# Patient Record
Sex: Female | Born: 1945
Health system: Southern US, Community
[De-identification: ages and names within clinical notes are randomized; demographics above are authoritative.]

## PROBLEM LIST (undated history)

## (undated) DIAGNOSIS — F0280 Dementia in other diseases classified elsewhere without behavioral disturbance: Secondary | ICD-10-CM

## (undated) DIAGNOSIS — R112 Nausea with vomiting, unspecified: Secondary | ICD-10-CM

## (undated) DIAGNOSIS — Z9889 Other specified postprocedural states: Secondary | ICD-10-CM

## (undated) DIAGNOSIS — E039 Hypothyroidism, unspecified: Secondary | ICD-10-CM

## (undated) DIAGNOSIS — M4800 Spinal stenosis, site unspecified: Secondary | ICD-10-CM

## (undated) DIAGNOSIS — M1990 Unspecified osteoarthritis, unspecified site: Secondary | ICD-10-CM

## (undated) DIAGNOSIS — F419 Anxiety disorder, unspecified: Secondary | ICD-10-CM

## (undated) DIAGNOSIS — E559 Vitamin D deficiency, unspecified: Secondary | ICD-10-CM

## (undated) DIAGNOSIS — E538 Deficiency of other specified B group vitamins: Secondary | ICD-10-CM

## (undated) DIAGNOSIS — M797 Fibromyalgia: Secondary | ICD-10-CM

## (undated) DIAGNOSIS — K5792 Diverticulitis of intestine, part unspecified, without perforation or abscess without bleeding: Secondary | ICD-10-CM

## (undated) DIAGNOSIS — K219 Gastro-esophageal reflux disease without esophagitis: Secondary | ICD-10-CM

## (undated) HISTORY — PX: APPENDECTOMY: SHX54

## (undated) HISTORY — PX: BREAST EXCISIONAL BIOPSY: SUR124

## (undated) HISTORY — PX: JOINT REPLACEMENT: SHX530

## (undated) HISTORY — PX: TRIGGER FINGER RELEASE: SHX641

## (undated) HISTORY — PX: OTHER SURGICAL HISTORY: SHX169

## (undated) HISTORY — PX: BACK SURGERY: SHX140

## (undated) HISTORY — PX: COLON SURGERY: SHX602

## (undated) HISTORY — DX: Deficiency of other specified B group vitamins: E53.8

## (undated) HISTORY — PX: BREAST SURGERY: SHX581

## (undated) HISTORY — PX: TUBAL LIGATION: SHX77

## (undated) HISTORY — PX: EYE SURGERY: SHX253

## (undated) HISTORY — PX: CHOLECYSTECTOMY: SHX55

## (undated) HISTORY — DX: Vitamin D deficiency, unspecified: E55.9

---

## 1999-12-13 ENCOUNTER — Encounter: Payer: Self-pay | Admitting: *Deleted

## 1999-12-13 ENCOUNTER — Encounter: Admission: RE | Admit: 1999-12-13 | Discharge: 1999-12-13 | Payer: Self-pay | Admitting: *Deleted

## 2000-01-30 ENCOUNTER — Other Ambulatory Visit: Admission: RE | Admit: 2000-01-30 | Discharge: 2000-01-30 | Payer: Self-pay | Admitting: Obstetrics and Gynecology

## 2000-02-27 ENCOUNTER — Other Ambulatory Visit: Admission: RE | Admit: 2000-02-27 | Discharge: 2000-02-27 | Payer: Self-pay | Admitting: Obstetrics and Gynecology

## 2000-02-27 ENCOUNTER — Encounter (INDEPENDENT_AMBULATORY_CARE_PROVIDER_SITE_OTHER): Payer: Self-pay | Admitting: Specialist

## 2000-07-27 ENCOUNTER — Encounter: Admission: RE | Admit: 2000-07-27 | Discharge: 2000-07-27 | Payer: Self-pay | Admitting: Gastroenterology

## 2000-07-27 ENCOUNTER — Encounter: Payer: Self-pay | Admitting: Gastroenterology

## 2000-12-13 ENCOUNTER — Encounter: Admission: RE | Admit: 2000-12-13 | Discharge: 2000-12-13 | Payer: Self-pay | Admitting: Obstetrics and Gynecology

## 2000-12-13 ENCOUNTER — Encounter: Payer: Self-pay | Admitting: Obstetrics and Gynecology

## 2000-12-13 ENCOUNTER — Other Ambulatory Visit: Admission: RE | Admit: 2000-12-13 | Discharge: 2000-12-13 | Payer: Self-pay | Admitting: Obstetrics and Gynecology

## 2001-11-20 ENCOUNTER — Encounter: Payer: Self-pay | Admitting: Orthopedic Surgery

## 2001-11-25 ENCOUNTER — Inpatient Hospital Stay (HOSPITAL_COMMUNITY): Admission: RE | Admit: 2001-11-25 | Discharge: 2001-11-28 | Payer: Self-pay | Admitting: Orthopedic Surgery

## 2002-04-22 ENCOUNTER — Encounter: Payer: Self-pay | Admitting: Obstetrics and Gynecology

## 2002-04-22 ENCOUNTER — Other Ambulatory Visit: Admission: RE | Admit: 2002-04-22 | Discharge: 2002-04-22 | Payer: Self-pay | Admitting: Obstetrics and Gynecology

## 2002-04-22 ENCOUNTER — Encounter: Admission: RE | Admit: 2002-04-22 | Discharge: 2002-04-22 | Payer: Self-pay | Admitting: Obstetrics and Gynecology

## 2002-12-29 ENCOUNTER — Encounter: Payer: Self-pay | Admitting: *Deleted

## 2002-12-29 ENCOUNTER — Emergency Department (HOSPITAL_COMMUNITY): Admission: EM | Admit: 2002-12-29 | Discharge: 2002-12-29 | Payer: Self-pay | Admitting: *Deleted

## 2003-05-07 ENCOUNTER — Encounter: Admission: RE | Admit: 2003-05-07 | Discharge: 2003-05-07 | Payer: Self-pay | Admitting: *Deleted

## 2003-05-07 ENCOUNTER — Other Ambulatory Visit: Admission: RE | Admit: 2003-05-07 | Discharge: 2003-05-07 | Payer: Self-pay | Admitting: Obstetrics and Gynecology

## 2004-05-20 ENCOUNTER — Ambulatory Visit (HOSPITAL_COMMUNITY): Admission: RE | Admit: 2004-05-20 | Discharge: 2004-05-20 | Payer: Self-pay | Admitting: Family Medicine

## 2005-05-02 ENCOUNTER — Other Ambulatory Visit: Admission: RE | Admit: 2005-05-02 | Discharge: 2005-05-02 | Payer: Self-pay | Admitting: *Deleted

## 2005-06-01 ENCOUNTER — Ambulatory Visit (HOSPITAL_COMMUNITY): Admission: RE | Admit: 2005-06-01 | Discharge: 2005-06-01 | Payer: Self-pay | Admitting: Family Medicine

## 2005-11-19 ENCOUNTER — Encounter: Admission: RE | Admit: 2005-11-19 | Discharge: 2005-11-19 | Payer: Self-pay | Admitting: Orthopedic Surgery

## 2005-12-08 ENCOUNTER — Emergency Department (HOSPITAL_COMMUNITY): Admission: EM | Admit: 2005-12-08 | Discharge: 2005-12-08 | Payer: Self-pay | Admitting: Emergency Medicine

## 2006-04-25 ENCOUNTER — Encounter: Admission: RE | Admit: 2006-04-25 | Discharge: 2006-06-06 | Payer: Self-pay | Admitting: *Deleted

## 2006-06-05 ENCOUNTER — Ambulatory Visit (HOSPITAL_COMMUNITY): Admission: RE | Admit: 2006-06-05 | Discharge: 2006-06-05 | Payer: Self-pay | Admitting: Family Medicine

## 2006-08-08 ENCOUNTER — Observation Stay (HOSPITAL_COMMUNITY): Admission: EM | Admit: 2006-08-08 | Discharge: 2006-08-09 | Payer: Self-pay | Admitting: Emergency Medicine

## 2006-08-22 ENCOUNTER — Encounter: Admission: RE | Admit: 2006-08-22 | Discharge: 2006-08-22 | Payer: Self-pay | Admitting: Gastroenterology

## 2006-10-09 ENCOUNTER — Other Ambulatory Visit: Admission: RE | Admit: 2006-10-09 | Discharge: 2006-10-09 | Payer: Self-pay | Admitting: *Deleted

## 2007-06-07 ENCOUNTER — Ambulatory Visit (HOSPITAL_COMMUNITY): Admission: RE | Admit: 2007-06-07 | Discharge: 2007-06-07 | Payer: Self-pay | Admitting: Family Medicine

## 2007-11-19 ENCOUNTER — Other Ambulatory Visit: Admission: RE | Admit: 2007-11-19 | Discharge: 2007-11-19 | Payer: Self-pay | Admitting: Family Medicine

## 2008-06-09 ENCOUNTER — Ambulatory Visit (HOSPITAL_COMMUNITY): Admission: RE | Admit: 2008-06-09 | Discharge: 2008-06-09 | Payer: Self-pay | Admitting: Family Medicine

## 2008-11-19 ENCOUNTER — Other Ambulatory Visit: Admission: RE | Admit: 2008-11-19 | Discharge: 2008-11-19 | Payer: Self-pay | Admitting: Family Medicine

## 2009-06-10 ENCOUNTER — Ambulatory Visit (HOSPITAL_COMMUNITY): Admission: RE | Admit: 2009-06-10 | Discharge: 2009-06-10 | Payer: Self-pay | Admitting: Family Medicine

## 2009-09-13 ENCOUNTER — Encounter: Admission: RE | Admit: 2009-09-13 | Discharge: 2009-09-13 | Payer: Self-pay | Admitting: Orthopedic Surgery

## 2010-06-13 ENCOUNTER — Ambulatory Visit (HOSPITAL_COMMUNITY)
Admission: RE | Admit: 2010-06-13 | Discharge: 2010-06-13 | Payer: Self-pay | Source: Home / Self Care | Attending: Family Medicine | Admitting: Family Medicine

## 2010-11-11 NOTE — H&P (Signed)
[place]. [facility]  Patient:    Jessica Whitney, Jessica C. Visit Number: [id number] MRN: [id number]          Service Type: SUR Location: 5000 5040 01 Attending Physician:  Carolan Shiver Ii Dictated by:   Jamelle Rushing, P.A. Admit Date:  11/25/2001 Discharge Date: 11/28/2001                           History and Physical  DATE OF BIRTH:  1945-08-15  CHIEF COMPLAINT:  Right knee pain.  HISTORY OF PRESENT ILLNESS:  The patient is a 65 year old white female with a history of knee pain since the early 1980s when she incurred a roller-skating accident injury to the knee.  She started having pain at that time.  She had two arthroscopic procedures which did give her some improvement, but she did incur a Staph infection at that time.  This was cleared up but the patient continued to have some good improvement until approximately 1993.  The patient fell once again, partially tearing her ACL in the right knee.  The patient did not require surgical intervention for this for repair and she continued having good results, up until the last few years, in which she started having return of some generalized constant pain with weightbearing activities.  The patient also had Synvisc injections, giving her good improvement for one year.  This last November, the pain significantly worsened with some increased stiffness in the knee.  The pain worsens with any type of weightbearing activity and during short periods of time thereafter with resting.  It is described as a deep throbbing, aching sensation.  It is located primarily in the posterior aspect of the knee but at times, it does present itself on the anterior aspect of the knee.  She does have discomfort radiating up into the distal femur. She does have night pain.  She currently does not have to use an assistive device for ambulation.  DRUG ALLERGIES:  SULFA.  CURRENT MEDICATIONS: 1. Femhrt 1/5 one tablet p.o.  q.d. 2. Protonix 40 mg p.o. q.d. 3. Paxil CR 25 mg p.o. q.d. 4. Tylenol Arthritis two to four tablets a day p.r.n.  MEDICAL HISTORY: 1. GERD, being managed by Dr. Katy Fitch. Buccini. 2. Fibromyalgia.   PAST SURGICAL PROCEDURES: 1. Left eye for crossed eye in 1951. 2. Tubal ligation in 1973. 3. Carpal tunnel in 1981. 4. Appendectomy in 1987. 5. Right knee arthroscopy in 1980 x2. 6. Colon resection in 1993. 7. Cholecystectomy in 1997.  The patient states she just has severe nausea with any type of anesthesia.  SOCIAL HISTORY:  The patient is a 65 year old healthy-appearing white female who denies any smoking or alcohol use.  She is currently married with two grown children and she lives in a [place].  She is currently employed as a Geologist, engineering.  PRIMARY CARE PHYSICIAN:  Family physician is [facility], ______, F.N.P., or Dr. Vanita Panda, [phone].  FAMILY MEDICAL HISTORY:  Both mother and father are alive and in good health at 73 years of age.  The patient has got one brother and two sisters in good health.  REVIEW OF SYSTEMS:  Review of systems is positive for glasses at all times. She does have problems with occasional flare-up of her reflux but this is generally well-controlled on Protonix.  The patient denies any other complications with any other sensory, respiratory, cardiac, GI, GU, hematologic, musculoskeletal,  neurologic or mental status issues.  PHYSICAL EXAMINATION:  VITALS:  Weight is 144.  Height is 5 feet 3 inches.  Pulse of 80 and regular, respirations 12, blood pressure is 124/68 and temperature is 99.0.  GENERAL:  This is a healthy-appearing, well-developed white female who ambulates with a minimal amount of limping or difficulty.  She does get on and off the exam table without any difficulty.  HEENT:  Head was normocephalic, atraumatic, nontender around maxillary or frontal sinuses.  Pupils equal, round and  reactive, accommodating to light. Extraocular movements are intact.  Sclerae are nonicteric.  Conjunctivae are pink and moist.  External ears were without deformities.  Canals patent.  TMs pearly gray and intact.  Gross hearing is intact.  Nasal septum was midline. Oral buccal mucosa was pink and moist without lesions.  Dentition was in good repair.  Uvula was midline.  The patient was able to swallow without any difficulties.  NECK:  Supple with no palpable lymphadenopathy.  Thyroid gland was nontender. The patient had good range of motion of her cervical spine with no tenderness along the percussion of the entire spinous process region.  CHEST:  Lung sounds were clear and equal bilaterally.  No wheezes, rales or rhonchi.  HEART:  Regular rate and rhythm.  S1 and S2 auscultated.  No murmurs, rubs, or gallops noted.  ABDOMEN:  Abdomen was round, soft and nontender.  She has no hepatosplenomegaly palpable.  She was nontender to deep palpation throughout. CVA was nontender to percussion and bowel sounds were normoactive throughout.  EXTREMITIES:  The patient had full range of motion of her shoulders, elbows and wrists without any difficulty.  Motor strength was 5/5.  Lower extremities:  Right and left hips had full extension, flexion up to 125 degrees with 20 degrees internal and external rotation without any mechanical symptoms bilaterally.  Right knee:  The patient was exquisitely tender along the medial and lateral and posterior aspects of the joint.  There was no sign of erythema or ecchymosis, no palpable effusion.  She had only slight crepitus under the patella with range of motion, which was about 3 degrees back to 100 degrees, with about 10 degrees valgus-varus laxity.  No anterior or posterior drawer.  The calf was nontender.  Left knee was without any erythema or ecchymosis and no effusion.  She was not specifically tender along the joint line.  She had full extension,  flexion back to 115 degrees, no valgus-varus laxity, no anterior or posterior drawer.  The calf was nontender.  Bilateral ankles were without any deformities.  She had good dorsiflexion and plantar flexion.  PERIPHERAL VASCULATURE:  Carotid pulses 2+, no bruits.  Radial pulses 2+. Femoral pulses 1+.  Dorsalis pedis and posterior tibial pulses were 1+ bilaterally with no edema or venous stasis changes noted.  NEUROLOGIC:  The patient was conscious, alert and appropriate and held an easy conversation with examiner.  Cranial nerves II-XII were grossly intact.  Deep tendon reflexes were brisk of the biceps, triceps, brachioradialis, the Achilles and the knees bilaterally.   IMPRESSION: 1. Severe osteoarthritis, right knee. 2. Gastroesophageal reflux disease. 3. Fibromyalgia. 4. History of Staphylococcus infection with arthroscopy of right knee.  PLAN:  The patient will be admitted to [facility] on November 25, 2001 under the care of Dr. Jonny Ruiz L. Rendall III.  The patient will undergo all routine labs and tests prior to this procedure.  We will also have the patient shower and have her knee  cleaned preoperatively with surgical prep in order to cut down the risks for a repeat Staph infection.  The patient will undergo a right total knee arthroplasty by Dr. Priscille Kluver. Dictated by:   Jamelle Rushing, P.A. Attending Physician:  Carolan Shiver Ii DD:  11/21/01 TD:  11/22/01 Job: [id number]

## 2010-11-11 NOTE — H&P (Signed)
NAMESHANEAL, BARASCH               ACCOUNT NO.:  [id number]   MEDICAL RECORD NO.:  [id number]          PATIENT TYPE:  INP   LOCATION:  2004                         FACILITY:  [facility]   PHYSICIAN:  Barnetta Chapel, MDDATE OF BIRTH:  [birth year]   DATE OF ADMISSION:  08/08/2006  DATE OF DISCHARGE:  08/09/2006                              HISTORY & PHYSICAL   PRIMARY CARE PHYSICIAN:  [employer] [facility] [employer].   PRESENTING COMPLAINT:  Chest pain x 4-5 days.   HISTORY OF PRESENT ILLNESS:  The patient is a 65 year old female with  past medical history significant for depression; fibromyalgia; and  gastric reflux.  The patient denied any history of hypertension,  diabetes mellitus, and the patient is a non nicotine abuser.  Patient  has a history of hypercholesterolemia.  The patient's maternal  grandparent,s family has a history of coronary artery disease.  The  patient presented with a 4- to 5-day history of chest pain.  The chest  pain is mainly at the lower sternal area.  The pain is said to be  intermittent, severe and would last for about one hour sometimes.  It is  not clear if this pain is radiating.  There is associated nausea, but no  shortness of breath or diaphoresis.   On presentation to the ER, the troponins so far (x two) have been  negative.  The D-dimer was minimally elevated, but the CT angiogram of  the chest was negative for PE.  The EKG does not reveal any ST segment  elevation or depression.  No headache, no neck pain, no cough, no fever  or chills, no vomiting or change in bowel habits, no weight loss and no  joint pains.   PAST MEDICAL HISTORY:  Past medical history is essentially as above.   MEDICATIONS:  Medications prior to admission include:  Wellbutrin 150 mg  p.o. once daily p.r.n.; paroxetine 13 mg p.o. once daily; Maxalt 10 mg  p.o. p.r.n.; Protonix 40 mg p.o. once daily.   SOCIAL HISTORY:  The patient is married with two children and two  grandchildren.  She denied any use of alcohol, illicit substances or  cigarettes.   FAMILY HISTORY:  Essentially as above.   REVIEW OF SYSTEMS:  Essentially as above.   PHYSICAL EXAMINATION:  VITAL SIGNS:  The temperature is 97.7, the blood  pressure is 102/54, the heart rate is 99, the respiratory rate is 18.  GENERAL APPEARANCE:  The patient is not in any pain at this moment.  HEENT:  No pallor, no jaundice.  External ocular muscles are intact.  NECK:  The neck is supple.  No raised JVD and no lymphadenopathy.  LUNGS:  Lungs are clear to auscultation.  CARDIOVASCULAR:  S1, S2, no heart murmur appreciated.  ABDOMEN:  The abdomen is soft and benign.  No epigastric tenderness.  No  organomegaly.  NEUROLOGIC:  Neurological exam is nonfocal.  EXTREMITIES:  No edema.   LABORATORY INVESTIGATIONS:  Labs done so far:  The sodium is 136, the  potassium is 4.3, the chloride is 107, the BUN is 12, with a  creatinine  of 1.  Blood sugar is 87.  White blood count is 6.7, hemoglobin of 13.4,  hematocrit of 39.2, MCV of 91.9, platelets of 409,000.  The troponin is  less than 0.05, and the CK-MB is less than 1.  The total protein is 6.7,  albumin is 3.7, AST 69, ALT is 18, with alkaline phosphatase of 71.  The  total bilirubin is 0.5, and the direct is less than 0.1.  Repeat  troponin is less than 0.05, and the repeat CK-MB is less than 1.   ASSESSMENT:  1. Chest pain.  2. Gastroesophageal reflux disease (GERD).  3. History of depression.  4. History of fibromyalgia.   PLAN:  Will admit the patient for observation.  Will get a cardiology  consultation as the patient will need a cardiac stress test in the  morning.  Meanwhile, will start the patient on subcutaneous Lovenox,  beta blocker, and ACE inhibitor.  We will also give the patient aspirin.  We will check fasting lipid profile; and we will get a chest x-ray.  Will check for fecal occult blood x three, as the patient's cause of  chest  pain may be of a GI origin.  Further management will depend on the  outcome of the investigations planned.      Barnetta Chapel, MD  Electronically Signed     SIO/MEDQ  D:  08/08/2006  T:  08/09/2006  Job:  [id number]

## 2010-11-11 NOTE — Op Note (Signed)
[place]. [facility]  Patient:    Fort, Jackelin C. Visit Number: [id number] MRN: [id number]          Service Type: SUR Location: 5000 5040 01 Attending Physician:  Carolan Shiver Ii Dictated by:   Carlisle Beers. Dorothyann Gibbs, M.D. Proc. Date: 11/25/01 Admit Date:  11/25/2001                             Operative Report  PREOPERATIVE DIAGNOSIS:  Osteoarthritis of right knee.  POSTOPERATIVE DIAGNOSIS:  Osteoarthritis of right knee.  OPERATION PERFORMED:  Cemented LCS total knee replacement, right.  SURGEON:  John L. Dorothyann Gibbs, M.D.  ASSISTANT: 1. Jamelle Rushing, P.A. 2. Scott, PAS.  ANESTHESIA:  General.  PATHOLOGY:  The patient has had an arthritic knee for many years.  Its symptoms of pain have been unremitting over the last year.  DESCRIPTION OF PROCEDURE:  Under general anesthesia, the right leg was prepared with DuraPrep and draped as a sterile field.  A sterile tourniquet was placed around the proximal thigh, leg was wrapped out with the Esmarch and a tourniquet was used at 350 mHg.  A midline incision was made in the skin and subcutaneous which was carried medial parapatellar deep.  The patella was everted, the femur was sized at a standard.  Proximal tibial resection is carried out.  The PCL was not preserved.  Using the first femoral guide, an intercondylar  drill hole was placed.  Using the second guide, the flexion gap was balanced at 12.5 by some release of the medial structures from the tibia by periosteal stripping and then the anterior and posterior flare of the distal femur were resected.  Using the intramedullary guide, a distal femoral cut was made, to balance the extension gap at 12.5 mm.  Recessing guide was then used.  Following this, lamina spreaders were inserted and remnants of the menisci and cruciates were removed.  Spurs off the back of the femoral condyles were resected with osteotome and mallet.  At this time the  proximal tibia was exposed with mchale, Hohmann and rake medially.  The tibia sized at 2.5, center peg hole was placed with a keel.  Trial reduction of the 2.5 tibia, 12.5 bearing and standard femur revealed excellent fit alignment and stability.  The patella was then osteotomized, three peg holes placed.  Trial patella was inserted.  Patellar tracking, range of motion and stability of the knee in flexion and extension was excellent.  Permanent components were obtained.  Bony surfaces were prepared with pulse irrigation, permanent components were then cemented into place.  Cement was hardened, tourniquet was let down at 46 minutes.  Excess cement was removed around the edges.  The tourniquet was let down and multiple small vessels were cauterized.  The wound was then closed in layers with #1 Tycron, 0 and 2-0 Vicryl and skin clips. Operative time approximately an hour and five minutes.  The patient tolerated the procedure well and returned to recovery in good condition. Dictated by:   Carlisle Beers. Dorothyann Gibbs, M.D. Attending Physician:  Carolan Shiver Ii DD:  11/25/01 TD:  11/25/01 Job: [id number]

## 2010-11-11 NOTE — Consult Note (Signed)
Jessica Whitney, Jessica Whitney               ACCOUNT NO.:  [id number]   MEDICAL RECORD NO.:  [id number]          PATIENT TYPE:  INP   LOCATION:  1829                         FACILITY:  [facility]   PHYSICIAN:  Lyn Records, M.D.   DATE OF BIRTH:  1945/07/08   DATE OF CONSULTATION:  08/09/2006  DATE OF DISCHARGE:                                 CONSULTATION   REASON FOR ADMISSION:  Chest discomfort.   CONCLUSIONS:  1. Prolonged substernal chest discomfort starting August 06, 2006.      Negative markers and normal EKGs, making ischemic pain unlikely.  2. History of gastroesophageal reflux.  3. History of migraines.  4. Fibromyalgia.  5. History of depression.   RECOMMENDATIONS:  1. Agree with rule-out as you have done.  2. Stress Cardiolite to rule out perfusion defects would verify the      presence of coronary artery disease.   COMMENTS:  The patient is 37 and has a history of reflux with chest  discomfort.  She saw Dr. Matthias Hughs on Monday.  Her therapy for reflux was  up-titrated.  She continued to have chest discomfort.  She called  [facility] yesterday and was instructed to come to  the emergency department.  She has been here since that time.  Her EKG  is normal.  Markers are negative. We have been consulted to rule out  coronary artery disease.   PAST MEDICAL HISTORY:  Her medical problems are as outlined above.   FAMILY HISTORY:  Negative for coronary atherosclerotic heart disease.  Her mother has a history of hypertension.   ALLERGIES:  SULFA.   MEDICATIONS:  Her typical medications are Wellbutrin, Maxalt, Protonix,  Paxil, Zegerid.  Since admission, metoprolol 12.5 mg per day and  lisinopril 20 mg per day have been added.  She is also now on an  aspirin.   HABITS:  She was a prior smoker.  Denies ethanol consumption.   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   PHYSICAL EXAMINATION:  GENERAL:  The patient  is lying quietly on the  gurney in no acute distress.  VITAL SIGNS:  Blood pressure 126/76.  Heart rate is 93.  Respirations  18.  HEENT:  Unremarkable.  CHEST:  Clear.  CARDIAC:  No gallop, no rub, no click, no murmur.  ABDOMEN:  Soft.  EXTREMITIES:  No edema.  Pulses are 2+ and symmetrical in the upper and  lower dentures.  NEUROLOGICAL:  Exam is normal.   LABORATORY AND ACCESSORY CLINICAL DATA:  Chest x-ray reveals no acute  disease.   CT reveals no pulmonary emboli.   Troponin I is 0.01.  CK-MB is normal.  D-dimer was 0.78, but chest CT is  negative for pulmonary emboli.  BUN and creatinine are normal at 12 and  1.0.  Hemoglobin is 13.4.   DISCUSSION:  Prolonged chest discomfort, probably noncardiac.  A  Cardiolite study will be done to exclude cardiac etiology.      Lyn Records, M.D.  Electronically Signed     HWS/MEDQ  D:  08/09/2006  T:  08/09/2006  Job:  [id number]   cc:   [facility]

## 2010-11-11 NOTE — Discharge Summary (Signed)
[place]. [facility]  Patient:    Jessica Whitney, Jessica C. Visit Number: [id number] MRN: [id number]          Service Type: SUR Location: 5000 5040 01 Attending Physician:  Carolan Shiver Ii Dictated by:   Jamelle Rushing, P.A. Admit Date:  11/25/2001 Discharge Date: 11/28/2001                             Discharge Summary  ADMISSION DIAGNOSES: 1. End-stage osteoarthritis, right knee. 2. Fibromyalgia. 3. Gastroesophageal reflux disease.  DISCHARGE DIAGNOSES: 1. Right total knee arthroplasty. 2. Postoperative blood loss anemia, asymptomatic. 3. Gastroesophageal reflux disease. 4. Fibromyalgia.  HISTORY OF PRESENT ILLNESS:  Patient is a 65 year old white female with history of right knee problems since early 71s.  Patient initially injured her knee while skating.  She had multiple arthroscopies for debridement of minuscule injuries.  She did have a resulting Staph infection after an arthroscopy which was improved after a course of antibiotic treatments. Approximately five years after that incident, patient had an injury while jumping tearing approximately 50% of her ACL in her right knee.  No treatment at that time was required.  Patient gradually improved.  Over the last several months since 04/2001, she has had significantly increase in pain in her knee with any type of activity.  The pain is located primarily in the posterior aspect of the knee.  It is described as an aching, throbbing sensation that does radiate up into the upper leg.  She does have popping, grinding and swelling and night pain in the knee.  She is not using an assistive device.  ALLERGIES:  Allergic to SULFA.  CURRENT MEDICATIONS: 1. Femhrt 1/5 one tablet p.o. q.d. 2. Protonix 40 mg p.o. q.d. 3. Paxil CR 25 mg p.o. q.d. 4. Tylenol arthritis p.r.n.  SURGICAL PROCEDURE:  On November 25, 2001, patient was taken to the operating room by Erasmo Leventhal, M.D. assisted by Jamelle Rushing,  P.A.C. and Dusty Scott, P.A.S. Under general anesthesia, the patient underwent a right total knee arthroplasty with a cemented LCS total knee replacement.  The patient tolerated the procedure well.  There were no complications. Patient had a postoperative femoral nerve block for pain management.  CONSULTS:  Routine physical therapy, occupational therapy and case management consults were requested.  HOSPITAL COURSE:  On November 25, 2001, patient was admitted to [facility] under the care of Dr. Erasmo Leventhal, M.D.  Patient was taken to the operating room where a right total knee arthroplasty was performed.  The patient tolerated the procedure well.  He was transferred to the recovery room and then to the orthopedic floor in good condition.  Patient was placed on Arixtra 2.5 mg subcutaneous a day for routine DVT prophylaxis.  Patient then incurred a total of three days postoperative course in which the patient did develop some asymptomatic postoperative postoperative anemia.  She also developed some calf discomfort in which a Doppler was performed and was negative for any DVT presence.  Otherwise the patient did very well with physical therapy.  She was able to get her knee up to 80 degrees in CPM.  Her pain was well controlled.  Her wound was benign for any signs of infection. On postoperative day #3, she felt like she was ready to be discharged to home and there were no reasons to keep her in the hospital.  She was, in fact, discharged home.  LABORATORIES:  CBC on November 27, 2001:  WBC 10.5, hemoglobin 9.5, hematocrit 28.0, platelets 269.  Routine chemistry on November 26, 2001:  Sodium 134, potassium 3.6, glucose 133, BUN 6, creatinine 0.8.  Routine urinalysis on admission was moderate for hemoglobin, few epithelial cells and many bacteria.  This was felt to be a contaminated specimen but she was also placed on routine postoperative antibiotics which would cover any UTI.  MEDICATIONS  ON DISCHARGE:  1. Colace 100 mg p.o. b.i.d.  2. Protonix 40 mg p.o. q.d.  3. Paxil 25 mg p.o. q.d.  4. Arixtra 2.5 mg subcutaneous q.d.  5. Femhrt 1/5 (home medication).  6. OxyContin CR 10 mg p.o. q.12 hours.  7. Percocet one to two tablets every 4-6 hours p.r.n. pain.  8. Tylenol 650 mg p.o. q.4 hours p.r.n.  9. Robaxin 500 mg p.o. q.4-6 hours p.r.n. spasms. 10. Laxative or enema of choice.  DISCHARGE INSTRUCTIONS: 1. Medications:    A. Patient can resume all home medications.    B. Percocet one to two tablets every 4-6 hours for breakthrough pain if       needed.    C. OxyContin CR 10 mg one tablet every 12 hours.    D. Arixtra 2.5 mg subcutaneous one injection a day for a total of seven       days. 2. Pain management:  Percocet and Oxycontin CR as noted above. 3. Activity:  As tolerated with use of a walker or crutches.  Patient may work    on full leg extension. 4. Diet:  No restrictions. 5. Wound care:  Patient to keep wound clean and dry.  She is to check the    wound daily for any signs of infection as discussed with the patient.  If    any problems, call Dr. Priscille Kluver. 6. Followup:  Patient is to have a follow-up appointment with Dr. Priscille Kluver in    one week from date of discharge.  DISCHARGE CONDITION:  Discharged to home.  Good. Dictated by:   Jamelle Rushing, P.A. Attending Physician:  Carolan Shiver Ii DD:  11/28/01 TD:  11/30/01 Job: [id number]

## 2011-05-12 ENCOUNTER — Other Ambulatory Visit (HOSPITAL_COMMUNITY): Payer: Self-pay | Admitting: Family Medicine

## 2011-05-12 DIAGNOSIS — Z1231 Encounter for screening mammogram for malignant neoplasm of breast: Secondary | ICD-10-CM

## 2011-06-15 ENCOUNTER — Ambulatory Visit (HOSPITAL_COMMUNITY): Payer: BC Managed Care – PPO

## 2011-07-11 ENCOUNTER — Ambulatory Visit (HOSPITAL_COMMUNITY): Payer: BC Managed Care – PPO

## 2011-08-07 ENCOUNTER — Ambulatory Visit (HOSPITAL_COMMUNITY)
Admission: RE | Admit: 2011-08-07 | Discharge: 2011-08-07 | Disposition: A | Discharge status: Home / Self Care | Payer: Medicare Other | Source: Ambulatory Visit | Attending: Family Medicine | Admitting: Family Medicine

## 2011-08-07 DIAGNOSIS — Z1231 Encounter for screening mammogram for malignant neoplasm of breast: Secondary | ICD-10-CM | POA: Insufficient documentation

## 2012-06-28 ENCOUNTER — Telehealth: Payer: Self-pay

## 2012-06-28 NOTE — Telephone Encounter (Signed)
Who did she see? I have called her, I do not have record of her/ I called and this message is in incorrect chart.

## 2012-06-28 NOTE — Telephone Encounter (Signed)
She needs appt. Left message for her to call back with her follow up plans.

## 2012-06-28 NOTE — Telephone Encounter (Signed)
Pt would like refill on her adderell please call [phone]

## 2012-08-12 ENCOUNTER — Other Ambulatory Visit (HOSPITAL_COMMUNITY): Payer: Self-pay | Admitting: Family Medicine

## 2012-08-12 DIAGNOSIS — Z1231 Encounter for screening mammogram for malignant neoplasm of breast: Secondary | ICD-10-CM

## 2012-08-22 ENCOUNTER — Ambulatory Visit (HOSPITAL_COMMUNITY)
Admission: RE | Admit: 2012-08-22 | Discharge: 2012-08-22 | Disposition: A | Discharge status: Home / Self Care | Payer: Medicare Other | Source: Ambulatory Visit | Attending: Family Medicine | Admitting: Family Medicine

## 2012-08-22 DIAGNOSIS — Z1231 Encounter for screening mammogram for malignant neoplasm of breast: Secondary | ICD-10-CM

## 2012-08-26 ENCOUNTER — Other Ambulatory Visit: Payer: Self-pay | Admitting: Orthopedic Surgery

## 2012-08-26 DIAGNOSIS — M25561 Pain in right knee: Secondary | ICD-10-CM

## 2012-08-28 ENCOUNTER — Ambulatory Visit
Admission: RE | Admit: 2012-08-28 | Discharge: 2012-08-28 | Disposition: A | Discharge status: Home / Self Care | Payer: Medicare Other | Source: Ambulatory Visit | Attending: Orthopedic Surgery | Admitting: Orthopedic Surgery

## 2012-08-28 DIAGNOSIS — M25461 Effusion, right knee: Secondary | ICD-10-CM

## 2012-08-28 DIAGNOSIS — M25561 Pain in right knee: Secondary | ICD-10-CM

## 2013-03-05 ENCOUNTER — Other Ambulatory Visit: Payer: Self-pay | Admitting: Neurosurgery

## 2013-03-05 DIAGNOSIS — M549 Dorsalgia, unspecified: Secondary | ICD-10-CM

## 2013-03-09 ENCOUNTER — Ambulatory Visit
Admission: RE | Admit: 2013-03-09 | Discharge: 2013-03-09 | Disposition: A | Discharge status: Home / Self Care | Payer: 59 | Source: Ambulatory Visit | Attending: Neurosurgery | Admitting: Neurosurgery

## 2013-03-09 DIAGNOSIS — M549 Dorsalgia, unspecified: Secondary | ICD-10-CM

## 2013-03-11 ENCOUNTER — Other Ambulatory Visit: Payer: Self-pay | Admitting: Neurosurgery

## 2013-03-25 ENCOUNTER — Encounter (HOSPITAL_COMMUNITY): Payer: Self-pay | Admitting: Pharmacy Technician

## 2013-03-26 ENCOUNTER — Encounter (HOSPITAL_COMMUNITY): Payer: Self-pay

## 2013-03-26 ENCOUNTER — Encounter (HOSPITAL_COMMUNITY)
Admission: RE | Admit: 2013-03-26 | Discharge: 2013-03-26 | Disposition: A | Discharge status: Home / Self Care | Payer: Medicare Other | Source: Ambulatory Visit | Attending: Neurosurgery | Admitting: Neurosurgery

## 2013-03-26 DIAGNOSIS — Z01812 Encounter for preprocedural laboratory examination: Secondary | ICD-10-CM | POA: Insufficient documentation

## 2013-03-26 DIAGNOSIS — Z01818 Encounter for other preprocedural examination: Secondary | ICD-10-CM | POA: Insufficient documentation

## 2013-03-26 HISTORY — DX: Fibromyalgia: M79.7

## 2013-03-26 HISTORY — DX: Gastro-esophageal reflux disease without esophagitis: K21.9

## 2013-03-26 HISTORY — DX: Anxiety disorder, unspecified: F41.9

## 2013-03-26 HISTORY — DX: Unspecified osteoarthritis, unspecified site: M19.90

## 2013-03-26 HISTORY — DX: Diverticulitis of intestine, part unspecified, without perforation or abscess without bleeding: K57.92

## 2013-03-26 HISTORY — DX: Other specified postprocedural states: Z98.890

## 2013-03-26 HISTORY — DX: Hypothyroidism, unspecified: E03.9

## 2013-03-26 HISTORY — DX: Other specified postprocedural states: R11.2

## 2013-03-26 LAB — BASIC METABOLIC PANEL
CO2: 27 mEq/L (ref 19–32)
Chloride: 103 mEq/L (ref 96–112)
Glucose, Bld: 90 mg/dL (ref 70–99)
Potassium: 4.2 mEq/L (ref 3.5–5.1)
Sodium: 140 mEq/L (ref 135–145)

## 2013-03-26 LAB — CBC
Hemoglobin: 13.3 g/dL (ref 12.0–15.0)
RBC: 4.23 MIL/uL (ref 3.87–5.11)
WBC: 6.3 10*3/uL (ref 4.0–10.5)

## 2013-03-26 LAB — SURGICAL PCR SCREEN
MRSA, PCR: NEGATIVE
Staphylococcus aureus: NEGATIVE

## 2013-03-26 NOTE — Progress Notes (Signed)
Primary physician - Dr. Vida Roller at [place] Does not have a cardiologist Ekg, stress - in epic from about 5 years ago.

## 2013-03-26 NOTE — Pre-Procedure Instructions (Signed)
Jessica Whitney  03/26/2013   Your procedure is scheduled on:  Thursday, October 9th  Report to Main Entrance "A" at 0530 AM.  Call this number if you have problems the morning of surgery: [phone]   Remember:   Do not eat food or drink liquids after midnight.   Take these medicines the morning of surgery with A SIP OF WATER: Celexa, Synthroid, Protonix, Zantac, Hydrocodone if needed   Do not wear jewelry, make-up or nail polish.  Do not wear lotions, powders, or perfumes. You may wear deodorant.  Do not shave 48 hours prior to surgery. Men may shave face and neck.  Do not bring valuables to the hospital.  [facility] is not responsible for any belongings or valuables.               Contacts, dentures or bridgework may not be worn into surgery.  Leave suitcase in the car. After surgery it may be brought to your room.  For patients admitted to the hospital, discharge time is determined by your   treatment team.               Patients discharged the day of surgery will not be allowed to drive home.    Special Instructions: Shower using CHG 2 nights before surgery and the night before surgery.  If you shower the day of surgery use CHG.  Use special wash - you have one bottle of CHG for all showers.  You should use approximately 1/3 of the bottle for each shower.   Please read over the following fact sheets that you were given: Pain Booklet, Coughing and Deep Breathing, MRSA Information and Surgical Site Infection Prevention

## 2013-04-02 MED ORDER — CEFAZOLIN SODIUM-DEXTROSE 2-3 GM-% IV SOLR
2.0000 g | INTRAVENOUS | Status: AC
  Administered 2013-04-03: 2 g via INTRAVENOUS
  Filled 2013-04-02: qty 50

## 2013-04-03 ENCOUNTER — Encounter (HOSPITAL_COMMUNITY): Payer: Self-pay | Admitting: Anesthesiology

## 2013-04-03 ENCOUNTER — Encounter (HOSPITAL_COMMUNITY): Payer: Medicare Other | Admitting: Anesthesiology

## 2013-04-03 ENCOUNTER — Inpatient Hospital Stay (HOSPITAL_COMMUNITY)
Admission: RE | Admit: 2013-04-03 | Discharge: 2013-04-04 | DRG: 520 | Disposition: A | Discharge status: Home / Self Care | Payer: Medicare Other | Source: Ambulatory Visit | Attending: Neurosurgery | Admitting: Neurosurgery

## 2013-04-03 ENCOUNTER — Ambulatory Visit (HOSPITAL_COMMUNITY): Payer: Medicare Other

## 2013-04-03 ENCOUNTER — Encounter (HOSPITAL_COMMUNITY)
Admission: RE | Disposition: A | Discharge status: Home / Self Care | Payer: Self-pay | Source: Ambulatory Visit | Attending: Neurosurgery

## 2013-04-03 ENCOUNTER — Ambulatory Visit (HOSPITAL_COMMUNITY): Payer: Medicare Other | Admitting: Anesthesiology

## 2013-04-03 DIAGNOSIS — Z9049 Acquired absence of other specified parts of digestive tract: Secondary | ICD-10-CM

## 2013-04-03 DIAGNOSIS — F411 Generalized anxiety disorder: Secondary | ICD-10-CM | POA: Diagnosis present

## 2013-04-03 DIAGNOSIS — Z79899 Other long term (current) drug therapy: Secondary | ICD-10-CM

## 2013-04-03 DIAGNOSIS — Z96659 Presence of unspecified artificial knee joint: Secondary | ICD-10-CM

## 2013-04-03 DIAGNOSIS — Z9089 Acquired absence of other organs: Secondary | ICD-10-CM

## 2013-04-03 DIAGNOSIS — M7130 Other bursal cyst, unspecified site: Secondary | ICD-10-CM | POA: Diagnosis present

## 2013-04-03 DIAGNOSIS — G8929 Other chronic pain: Secondary | ICD-10-CM | POA: Diagnosis present

## 2013-04-03 DIAGNOSIS — M48062 Spinal stenosis, lumbar region with neurogenic claudication: Secondary | ICD-10-CM

## 2013-04-03 DIAGNOSIS — E039 Hypothyroidism, unspecified: Secondary | ICD-10-CM | POA: Diagnosis present

## 2013-04-03 DIAGNOSIS — Z9851 Tubal ligation status: Secondary | ICD-10-CM

## 2013-04-03 DIAGNOSIS — M48061 Spinal stenosis, lumbar region without neurogenic claudication: Principal | ICD-10-CM | POA: Diagnosis present

## 2013-04-03 DIAGNOSIS — K219 Gastro-esophageal reflux disease without esophagitis: Secondary | ICD-10-CM | POA: Diagnosis present

## 2013-04-03 DIAGNOSIS — Z23 Encounter for immunization: Secondary | ICD-10-CM

## 2013-04-03 DIAGNOSIS — Z882 Allergy status to sulfonamides status: Secondary | ICD-10-CM

## 2013-04-03 DIAGNOSIS — Z87891 Personal history of nicotine dependence: Secondary | ICD-10-CM

## 2013-04-03 HISTORY — PX: LUMBAR LAMINECTOMY/DECOMPRESSION MICRODISCECTOMY: SHX5026

## 2013-04-03 SURGERY — LUMBAR LAMINECTOMY/DECOMPRESSION MICRODISCECTOMY 1 LEVEL
Anesthesia: General | Site: Back | Wound class: Clean

## 2013-04-03 MED ORDER — ONDANSETRON HCL 4 MG/2ML IJ SOLN: 4.0000 mg | INTRAMUSCULAR | Status: DC | PRN

## 2013-04-03 MED ORDER — SODIUM CHLORIDE 0.9 % IV SOLN: 250.0000 mL | INTRAVENOUS | Status: DC

## 2013-04-03 MED ORDER — CEFAZOLIN SODIUM-DEXTROSE 2-3 GM-% IV SOLR
2.0000 g | Freq: Three times a day (TID) | INTRAVENOUS | Status: AC
  Administered 2013-04-03 – 2013-04-04 (×3): 2 g via INTRAVENOUS
  Filled 2013-04-03 (×3): qty 50

## 2013-04-03 MED ORDER — BUPIVACAINE HCL (PF) 0.5 % IJ SOLN
INTRAMUSCULAR | Status: DC | PRN
  Administered 2013-04-03: 20 mL

## 2013-04-03 MED ORDER — PROMETHAZINE HCL 25 MG/ML IJ SOLN: 6.2500 mg | INTRAMUSCULAR | Status: DC | PRN

## 2013-04-03 MED ORDER — ACETAMINOPHEN 650 MG RE SUPP: 650.0000 mg | RECTAL | Status: DC | PRN

## 2013-04-03 MED ORDER — HYDROCODONE-ACETAMINOPHEN 5-325 MG PO TABS
ORAL_TABLET | ORAL | Status: AC
  Filled 2013-04-03: qty 2

## 2013-04-03 MED ORDER — PROPOFOL 10 MG/ML IV BOLUS
INTRAVENOUS | Status: DC | PRN
  Administered 2013-04-03: 120 mg via INTRAVENOUS

## 2013-04-03 MED ORDER — FENTANYL CITRATE 0.05 MG/ML IJ SOLN
INTRAMUSCULAR | Status: DC | PRN
  Administered 2013-04-03: 200 ug via INTRAVENOUS
  Administered 2013-04-03: 50 ug via INTRAVENOUS

## 2013-04-03 MED ORDER — DEXAMETHASONE SODIUM PHOSPHATE 4 MG/ML IJ SOLN
INTRAMUSCULAR | Status: DC | PRN
  Administered 2013-04-03: 8 mg via INTRAVENOUS

## 2013-04-03 MED ORDER — DEXAMETHASONE 4 MG PO TABS
4.0000 mg | ORAL_TABLET | Freq: Four times a day (QID) | ORAL | Status: AC
  Administered 2013-04-03 (×2): 4 mg via ORAL
  Filled 2013-04-03 (×2): qty 1

## 2013-04-03 MED ORDER — HEMOSTATIC AGENTS (NO CHARGE) OPTIME
TOPICAL | Status: DC | PRN
  Administered 2013-04-03: 1 via TOPICAL

## 2013-04-03 MED ORDER — SODIUM CHLORIDE 0.9 % IJ SOLN
3.0000 mL | Freq: Two times a day (BID) | INTRAMUSCULAR | Status: DC
  Administered 2013-04-03 – 2013-04-04 (×2): 3 mL via INTRAVENOUS

## 2013-04-03 MED ORDER — INFLUENZA VAC SPLIT QUAD 0.5 ML IM SUSP
0.5000 mL | INTRAMUSCULAR | Status: AC
  Administered 2013-04-04: 0.5 mL via INTRAMUSCULAR
  Filled 2013-04-03: qty 0.5

## 2013-04-03 MED ORDER — ACETAMINOPHEN 325 MG PO TABS: 650.0000 mg | ORAL_TABLET | ORAL | Status: DC | PRN

## 2013-04-03 MED ORDER — SODIUM CHLORIDE 0.9 % IR SOLN
Status: DC | PRN
  Administered 2013-04-03: 08:00:00

## 2013-04-03 MED ORDER — OXYCODONE HCL 5 MG/5ML PO SOLN: 5.0000 mg | Freq: Once | ORAL | Status: DC | PRN

## 2013-04-03 MED ORDER — THROMBIN 5000 UNITS EX SOLR
CUTANEOUS | Status: DC | PRN
  Administered 2013-04-03 (×2): 5000 [IU] via TOPICAL

## 2013-04-03 MED ORDER — HYDROMORPHONE HCL PF 1 MG/ML IJ SOLN
1.0000 mg | INTRAMUSCULAR | Status: DC | PRN
  Administered 2013-04-03: 1.5 mg via INTRAMUSCULAR
  Filled 2013-04-03: qty 2
  Filled 2013-04-03: qty 1

## 2013-04-03 MED ORDER — HYDROCODONE-ACETAMINOPHEN 5-325 MG PO TABS
1.0000 | ORAL_TABLET | ORAL | Status: DC | PRN
  Administered 2013-04-03 (×2): 2 via ORAL
  Filled 2013-04-03: qty 2

## 2013-04-03 MED ORDER — LEVOTHYROXINE SODIUM 75 MCG PO TABS
75.0000 ug | ORAL_TABLET | Freq: Every day | ORAL | Status: DC
  Filled 2013-04-03 (×2): qty 1

## 2013-04-03 MED ORDER — PANTOPRAZOLE SODIUM 40 MG IV SOLR
40.0000 mg | Freq: Every day | INTRAVENOUS | Status: DC
  Filled 2013-04-03: qty 40

## 2013-04-03 MED ORDER — ARTIFICIAL TEARS OP OINT
TOPICAL_OINTMENT | OPHTHALMIC | Status: DC | PRN
  Administered 2013-04-03: 1 via OPHTHALMIC

## 2013-04-03 MED ORDER — 0.9 % SODIUM CHLORIDE (POUR BTL) OPTIME
TOPICAL | Status: DC | PRN
  Administered 2013-04-03: 1000 mL

## 2013-04-03 MED ORDER — HYDROMORPHONE HCL PF 1 MG/ML IJ SOLN
0.2500 mg | INTRAMUSCULAR | Status: DC | PRN
  Administered 2013-04-03 (×2): 0.5 mg via INTRAVENOUS

## 2013-04-03 MED ORDER — GLYCOPYRROLATE 0.2 MG/ML IJ SOLN
INTRAMUSCULAR | Status: DC | PRN
  Administered 2013-04-03: 0.4 mg via INTRAVENOUS

## 2013-04-03 MED ORDER — LACTATED RINGERS IV SOLN
INTRAVENOUS | Status: DC | PRN
  Administered 2013-04-03 (×2): via INTRAVENOUS

## 2013-04-03 MED ORDER — DEXAMETHASONE SODIUM PHOSPHATE 4 MG/ML IJ SOLN: 4.0000 mg | Freq: Four times a day (QID) | INTRAMUSCULAR | Status: AC

## 2013-04-03 MED ORDER — PHENOL 1.4 % MT LIQD: 1.0000 | OROMUCOSAL | Status: DC | PRN

## 2013-04-03 MED ORDER — MIDAZOLAM HCL 5 MG/5ML IJ SOLN
INTRAMUSCULAR | Status: DC | PRN
  Administered 2013-04-03: 1 mg via INTRAVENOUS

## 2013-04-03 MED ORDER — CITALOPRAM HYDROBROMIDE 20 MG PO TABS
20.0000 mg | ORAL_TABLET | Freq: Every day | ORAL | Status: DC
  Filled 2013-04-03: qty 1

## 2013-04-03 MED ORDER — EPHEDRINE SULFATE 50 MG/ML IJ SOLN
INTRAMUSCULAR | Status: DC | PRN
  Administered 2013-04-03 (×3): 5 mg via INTRAVENOUS

## 2013-04-03 MED ORDER — HYDROMORPHONE HCL PF 1 MG/ML IJ SOLN
INTRAMUSCULAR | Status: AC
  Filled 2013-04-03: qty 1

## 2013-04-03 MED ORDER — SODIUM CHLORIDE 0.9 % IJ SOLN: 3.0000 mL | INTRAMUSCULAR | Status: DC | PRN

## 2013-04-03 MED ORDER — LIDOCAINE HCL (CARDIAC) 20 MG/ML IV SOLN
INTRAVENOUS | Status: DC | PRN
  Administered 2013-04-03: 40 mg via INTRAVENOUS

## 2013-04-03 MED ORDER — MENTHOL 3 MG MT LOZG: 1.0000 | LOZENGE | OROMUCOSAL | Status: DC | PRN

## 2013-04-03 MED ORDER — KETOROLAC TROMETHAMINE 30 MG/ML IJ SOLN
30.0000 mg | Freq: Four times a day (QID) | INTRAMUSCULAR | Status: DC
  Administered 2013-04-03 – 2013-04-04 (×4): 30 mg via INTRAVENOUS
  Filled 2013-04-03 (×8): qty 1

## 2013-04-03 MED ORDER — ROCURONIUM BROMIDE 100 MG/10ML IV SOLN
INTRAVENOUS | Status: DC | PRN
  Administered 2013-04-03: 50 mg via INTRAVENOUS

## 2013-04-03 MED ORDER — KCL IN DEXTROSE-NACL 20-5-0.45 MEQ/L-%-% IV SOLN
80.0000 mL/h | INTRAVENOUS | Status: DC
  Filled 2013-04-03 (×3): qty 1000

## 2013-04-03 MED ORDER — MIDAZOLAM HCL 2 MG/2ML IJ SOLN: 0.5000 mg | Freq: Once | INTRAMUSCULAR | Status: DC | PRN

## 2013-04-03 MED ORDER — CYCLOBENZAPRINE HCL 10 MG PO TABS: 10.0000 mg | ORAL_TABLET | Freq: Three times a day (TID) | ORAL | Status: DC | PRN

## 2013-04-03 MED ORDER — OXYCODONE HCL 5 MG PO TABS: 5.0000 mg | ORAL_TABLET | Freq: Once | ORAL | Status: DC | PRN

## 2013-04-03 MED ORDER — ONDANSETRON HCL 4 MG/2ML IJ SOLN
INTRAMUSCULAR | Status: DC | PRN
  Administered 2013-04-03 (×2): 4 mg via INTRAMUSCULAR

## 2013-04-03 MED ORDER — NEOSTIGMINE METHYLSULFATE 1 MG/ML IJ SOLN
INTRAMUSCULAR | Status: DC | PRN
  Administered 2013-04-03: 3 mg via INTRAVENOUS

## 2013-04-03 MED ORDER — PANTOPRAZOLE SODIUM 40 MG PO TBEC: 40.0000 mg | DELAYED_RELEASE_TABLET | Freq: Every day | ORAL | Status: DC

## 2013-04-03 MED ORDER — MEPERIDINE HCL 25 MG/ML IJ SOLN: 6.2500 mg | INTRAMUSCULAR | Status: DC | PRN

## 2013-04-03 SURGICAL SUPPLY — 54 items
ADH SKN CLS APL DERMABOND .7 (GAUZE/BANDAGES/DRESSINGS) ×1
ADH SKN CLS LQ APL DERMABOND (GAUZE/BANDAGES/DRESSINGS) ×1
APL SKNCLS STERI-STRIP NONHPOA (GAUZE/BANDAGES/DRESSINGS) ×1
BAG DECANTER FOR FLEXI CONT (MISCELLANEOUS) ×2 IMPLANT
BENZOIN TINCTURE PRP APPL 2/3 (GAUZE/BANDAGES/DRESSINGS) ×3 IMPLANT
BLADE SURG ROTATE 9660 (MISCELLANEOUS) ×2 IMPLANT
BNDG ADH 5X3 H2O RPLNT NS (GAUZE/BANDAGES/DRESSINGS) ×1
BNDG COHESIVE 3X5 WHT NS (GAUZE/BANDAGES/DRESSINGS) ×1 IMPLANT
BRUSH SCRUB EZ PLAIN DRY (MISCELLANEOUS) ×2 IMPLANT
BUR CUTTER 7.0 ROUND (BURR) ×2 IMPLANT
BUR MATCHSTICK NEURO 3.0 LAGG (BURR) ×2 IMPLANT
CANISTER SUCTION 2500CC (MISCELLANEOUS) ×2 IMPLANT
CLOTH BEACON ORANGE TIMEOUT ST (SAFETY) ×2 IMPLANT
CONT SPEC 4OZ CLIKSEAL STRL BL (MISCELLANEOUS) ×2 IMPLANT
DERMABOND ADHESIVE PROPEN (GAUZE/BANDAGES/DRESSINGS) ×1
DERMABOND ADVANCED (GAUZE/BANDAGES/DRESSINGS) ×1
DERMABOND ADVANCED .7 DNX12 (GAUZE/BANDAGES/DRESSINGS) ×1 IMPLANT
DERMABOND ADVANCED .7 DNX6 (GAUZE/BANDAGES/DRESSINGS) IMPLANT
DEVICE COFLEX STABLIZATION 10M (Neuro Prosthesis/Implant) ×1 IMPLANT
DRAPE LAPAROTOMY 100X72X124 (DRAPES) ×2 IMPLANT
DRAPE MICROSCOPE LEICA (MISCELLANEOUS) ×1 IMPLANT
DRAPE SURG 17X23 STRL (DRAPES) ×4 IMPLANT
DRESSING TELFA 8X3 (GAUZE/BANDAGES/DRESSINGS) ×2 IMPLANT
DURAPREP 26ML APPLICATOR (WOUND CARE) ×2 IMPLANT
ELECT REM PT RETURN 9FT ADLT (ELECTROSURGICAL) ×2
ELECTRODE REM PT RTRN 9FT ADLT (ELECTROSURGICAL) ×1 IMPLANT
EVACUATOR 1/8 PVC DRAIN (DRAIN) ×1 IMPLANT
GAUZE SPONGE 4X4 16PLY XRAY LF (GAUZE/BANDAGES/DRESSINGS) IMPLANT
GLOVE ECLIPSE 7.5 STRL STRAW (GLOVE) ×1 IMPLANT
GLOVE ECLIPSE 8.0 STRL XLNG CF (GLOVE) ×5 IMPLANT
GLOVE INDICATOR 8.0 STRL GRN (GLOVE) ×1 IMPLANT
GLOVE INDICATOR 8.5 STRL (GLOVE) ×1 IMPLANT
GOWN BRE IMP SLV AUR LG STRL (GOWN DISPOSABLE) IMPLANT
GOWN BRE IMP SLV AUR XL STRL (GOWN DISPOSABLE) ×3 IMPLANT
GOWN STRL REIN 2XL LVL4 (GOWN DISPOSABLE) ×1 IMPLANT
KIT BASIN OR (CUSTOM PROCEDURE TRAY) ×2 IMPLANT
KIT ROOM TURNOVER OR (KITS) ×2 IMPLANT
NDL SPNL 22GX3.5 QUINCKE BK (NEEDLE) ×2 IMPLANT
NEEDLE HYPO 22GX1.5 SAFETY (NEEDLE) ×2 IMPLANT
NEEDLE SPNL 22GX3.5 QUINCKE BK (NEEDLE) ×4 IMPLANT
NS IRRIG 1000ML POUR BTL (IV SOLUTION) ×2 IMPLANT
PACK LAMINECTOMY NEURO (CUSTOM PROCEDURE TRAY) ×2 IMPLANT
PAD ARMBOARD 7.5X6 YLW CONV (MISCELLANEOUS) ×6 IMPLANT
PATTIES SURGICAL .75X.75 (GAUZE/BANDAGES/DRESSINGS) ×2 IMPLANT
RUBBERBAND STERILE (MISCELLANEOUS) ×4 IMPLANT
SPONGE GAUZE 4X4 12PLY (GAUZE/BANDAGES/DRESSINGS) ×2 IMPLANT
SPONGE SURGIFOAM ABS GEL SZ50 (HEMOSTASIS) ×2 IMPLANT
STRIP CLOSURE SKIN 1/2X4 (GAUZE/BANDAGES/DRESSINGS) ×2 IMPLANT
SUT VIC AB 2-0 OS6 18 (SUTURE) ×6 IMPLANT
SUT VIC AB 3-0 CP2 18 (SUTURE) ×2 IMPLANT
SYR 20ML ECCENTRIC (SYRINGE) ×2 IMPLANT
TOWEL OR 17X24 6PK STRL BLUE (TOWEL DISPOSABLE) ×2 IMPLANT
TOWEL OR 17X26 10 PK STRL BLUE (TOWEL DISPOSABLE) ×2 IMPLANT
WATER STERILE IRR 1000ML POUR (IV SOLUTION) ×2 IMPLANT

## 2013-04-03 NOTE — Anesthesia Postprocedure Evaluation (Signed)
  Anesthesia Post-op Note  Patient: Jessica Whitney  Procedure(s) Performed: Procedure(s) with comments: Lumbar Four-Five Lumbar diskectomy with Coflex (N/A) - L4-5 Lumbar diskectomy with Coflex  Patient Location: PACU  Anesthesia Type:General  Level of Consciousness: awake, alert , oriented and patient cooperative  Airway and Oxygen Therapy: Patient Spontanous Breathing and Patient connected to nasal cannula oxygen  Post-op Pain: mild  Post-op Assessment: Post-op Vital signs reviewed, Patient's Cardiovascular Status Stable, Respiratory Function Stable, Patent Airway, No signs of Nausea or vomiting and Pain level controlled  Post-op Vital Signs: Reviewed and stable  Complications: No apparent anesthesia complications

## 2013-04-03 NOTE — Anesthesia Preprocedure Evaluation (Addendum)
Anesthesia Evaluation  Patient identified by MRN, date of birth, ID band Patient awake    Reviewed: Allergy & Precautions, H&P , NPO status , Patient's Chart, lab work & pertinent test results  History of Anesthesia Complications (+) PONV and history of anesthetic complications (PONV with 1 surgery)  Airway Mallampati: III TM Distance: >3 FB Neck ROM: Full    Dental  (+) Teeth Intact, Caps and Dental Advisory Given   Pulmonary former smoker,  breath sounds clear to auscultation  Pulmonary exam normal       Cardiovascular negative cardio ROS  Rhythm:Regular Rate:Normal     Neuro/Psych Anxiety Chronic back pain: narcotics daily    GI/Hepatic Neg liver ROS, GERD-  Medicated and Controlled,  Endo/Other  Hypothyroidism   Renal/GU negative Renal ROS     Musculoskeletal  (+) Arthritis -, Fibromyalgia -  Abdominal Normal abdominal exam  (+)   Peds  Hematology   Anesthesia Other Findings   Reproductive/Obstetrics                          Anesthesia Physical Anesthesia Plan  ASA: II  Anesthesia Plan: General   Post-op Pain Management:    Induction: Intravenous  Airway Management Planned: Oral ETT and Video Laryngoscope Planned  Additional Equipment:   Intra-op Plan:   Post-operative Plan: Extubation in OR  Informed Consent: I have reviewed the patients History and Physical, chart, labs and discussed the procedure including the risks, benefits and alternatives for the proposed anesthesia with the patient or authorized representative who has indicated his/her understanding and acceptance.   Dental advisory given  Plan Discussed with: CRNA, Anesthesiologist and Surgeon  Anesthesia Plan Comments: (Plan routine monitors, GETA with VideoGlide intubation )       Anesthesia Quick Evaluation

## 2013-04-03 NOTE — Transfer of Care (Signed)
Immediate Anesthesia Transfer of Care Note  Patient: Jessica Whitney  Procedure(s) Performed: Procedure(s) with comments: Lumbar Four-Five Lumbar diskectomy with Coflex (N/A) - L4-5 Lumbar diskectomy with Coflex  Patient Location: PACU  Anesthesia Type:General  Level of Consciousness: awake, alert  and oriented  Airway & Oxygen Therapy: Patient Spontanous Breathing and Patient connected to face mask oxygen  Post-op Assessment: Report given to PACU RN  Post vital signs: Reviewed and stable  Complications: No apparent anesthesia complications

## 2013-04-03 NOTE — H&P (Signed)
Jessica Whitney is an 67 y.o. female.   Chief Complaint: Back and right buttock and thigh pain HPI: The patient is a [age] female who was evaluated in the office for back and right hip pain a few months duration. She's had episodes in the past And injections a few years ago an MRI scan at that time. Which came to see me she had no new studies. She is her primary medical doctor gave her some injections without relief. She's been tried on Vicodin for pain was given a mildly Foley. After evaluation the office a new MRI scan of the lumbar spine was obtained. This showed a diffuse disc abnormality at L4-5 as well as marked collapse. After discussing the options it was elected to proceed with surgery with a bilateral decompression possible discectomy and placement of the Coflex. I've had a long discussion with her regarding the risks and benefits of surgical intervention. The risks discussed include but are not limited to bleeding infection weakness numbness paralysis spinal fluid leak trouble with instrumentation coma and death. We have discussed alternative methods of therapy offered risks and benefits of nonintervention. She's had the opportunity to ask numerous questions and appears to understand. With this information in hand she has requested that we proceed with surgery.  Past Medical History  Diagnosis Date  . PONV (postoperative nausea and vomiting)   . Hypothyroidism   . Fibromyalgia   . Anxiety   . GERD (gastroesophageal reflux disease)     bucchini  . Arthritis   . Diverticulitis     Past Surgical History  Procedure Laterality Date  . Colon surgery      foot of colon removed - diverticulitis  . Joint replacement Right     knee  . Knee arthroscopic Right   . Bone spur shoulder Bilateral   . Trigger finger release Right     x2  . Eye surgery Left     cross-eye fixed  . Appendectomy    . Cholecystectomy    . Tubal ligation    . Breast surgery Right     lumpectomy non cancerous     No family history on file. Social History:  reports that she has quit smoking. She does not have any smokeless tobacco history on file. She reports that she drinks alcohol. She reports that she does not use illicit drugs.  Allergies:  Allergies  Allergen Reactions  . Sulfa Antibiotics Other (See Comments)    Caused mouth sores    Medications Prior to Admission  Medication Sig Dispense Refill  . citalopram (CELEXA) 20 MG tablet Take 20 mg by mouth every morning.      Marland Kitchen HYDROcodone-acetaminophen (NORCO/VICODIN) 5-325 MG per tablet Take 1 tablet by mouth 2 (two) times daily as needed for pain.      Marland Kitchen levothyroxine (SYNTHROID, LEVOTHROID) 75 MCG tablet Take 75 mcg by mouth daily before breakfast.      . naproxen sodium (ANAPROX) 220 MG tablet Take 220 mg by mouth 2 (two) times daily with a meal.      . pantoprazole (PROTONIX) 40 MG tablet Take 40 mg by mouth every morning.      . Probiotic Product (ALIGN) 4 MG CAPS Take 4 mg by mouth every morning.      . calcium carbonate (TUMS EX) 750 MG chewable tablet Chew 2 tablets by mouth daily as needed (for indigestion).      . ranitidine (ZANTAC) 150 MG capsule Take 150 mg by mouth daily  as needed (for indigestion).        No results found for this or any previous visit (from the past 48 hour(s)). No results found.  Review of systems is positive for joint pain joint swelling of headaches. She denies any problems with chest pain asthma emphysema gastrointestinal or genitourinary problems but she has had a colectomy in the past  Blood pressure 126/68, pulse 65, temperature 97 F (36.1 C), temperature source Oral, resp. rate 16, SpO2 100.00%.  Patient is awake alert and oriented. She is no facial asymmetry. Her gait is mildly antalgic. Reflexes are decreased at the right ankle jerk her strength is 5 over 5 Assessment/Plan Impression is that of a diffuse disc abnormality with secondary stenosis and disc collapse at L4-5. The plan is for an  L4-5 decompression with placement of a Coflex device.  Reinaldo Meeker, MD 04/03/2013, 7:32 AM

## 2013-04-03 NOTE — Progress Notes (Signed)
UR COMPLETED  

## 2013-04-03 NOTE — Op Note (Signed)
Preop diagnosis: Spinal stenosis and synovial cyst L4-5 Postop diagnosis: Same Procedure: Bilateral L4-5 decompressive laminectomy with removal of synovial cyst Placement of Coflex device L4-5 Surgeon: Taleya Whitcher  After being placed the prone position the patient's back was prepped and draped in the usual sterile fashion. Localizing fluoroscopy was used prior to incision to identify the appropriate level. Midline incision was made above the spinous processes of L4 and L5. Using Bovie cutting current the incision was carried on the spinous processes. Subperiosteal dissection was then carried out bilaterally on the spinous processes lamina facet joint subcutaneous tract was placed for exposure. X-ray showed approach the appropriate level. Starting on the patient's left side laminotomy was performed removing the inferior one half of the L4 lamina the medial facet joint the superior one third of the L5 lamina. Residual bone and ligamentum flavum removed in a piecemeal fashion. Summary compression was then carried on the patient's right side. Interspinous space was identified and widened midline structures were removed to complete the bilateral laminectomy but the spinous processes were spared without difficulty. Synovial cyst was removed from the right side until the L5 nerve root was well visualized well decompressed. At this time inspection was carried out in all directions for any evidence of residual compression and none could be identified. Irrigation was carried out and any bleeding control proper coagulation Gelfoam. We then sized the interspinous complex device and found a 10 mm device was a good fit. The wings were splayed open the device was placed on an excellent position confirmed by fluoroscopy and the wings were Aleaya Latona on the spinous processes. AP lateral fossae looked excellent. Irrigation was carried out once more and any bleeding control proper coagulation Gelfoam. A drain was left and brought  through the area of the Coflex device is recommended. The was then closed in multiple layers of Vicryl on the muscle fascia subcutaneous and subcuticular tissues. Dermabond Steri-Strips were placed on the skin. Shortness was then applied the patient was extubated and taken to recovery room in stable condition.

## 2013-04-03 NOTE — Preoperative (Signed)
Beta Blockers   Reason not to administer Beta Blockers:Not Applicable 

## 2013-04-04 ENCOUNTER — Encounter (HOSPITAL_COMMUNITY): Payer: Self-pay | Admitting: Neurosurgery

## 2013-04-04 NOTE — Discharge Summary (Signed)
  Physician Discharge Summary  Patient ID: Jessica Whitney MRN: [id number] DOB/AGE: [birth year] 66 y.o.  Admit date: 04/03/2013 Discharge date: 04/04/2013  Admission Diagnoses:  Discharge Diagnoses:  Active Problems:   * No active hospital problems. *   Discharged Condition: good  Hospital Course: Surgery one day for spinal stenosis at L 45. Did very well with marked decrease in pain. Up ambulating next day. Wound fine. Home pod 1, specific instructions given.  Consults: None  Significant Diagnostic Studies: none  Treatments: L 45 decompression with coflex  Discharge Exam: Blood pressure 93/52, pulse 73, temperature 97.9 F (36.6 C), temperature source Oral, resp. rate 20, SpO2 95.00%. Incision/Wound:clean and dry; no new neuro issues  Disposition:      Medication List    ASK your doctor about these medications       ALIGN 4 MG Caps  Take 4 mg by mouth every morning.     calcium carbonate 750 MG chewable tablet  Commonly known as:  TUMS EX  Chew 2 tablets by mouth daily as needed (for indigestion).     citalopram 20 MG tablet  Commonly known as:  CELEXA  Take 20 mg by mouth every morning.     HYDROcodone-acetaminophen 5-325 MG per tablet  Commonly known as:  NORCO/VICODIN  Take 1 tablet by mouth 2 (two) times daily as needed for pain.     levothyroxine 75 MCG tablet  Commonly known as:  SYNTHROID, LEVOTHROID  Take 75 mcg by mouth daily before breakfast.     naproxen sodium 220 MG tablet  Commonly known as:  ANAPROX  Take 220 mg by mouth 2 (two) times daily with a meal.     pantoprazole 40 MG tablet  Commonly known as:  PROTONIX  Take 40 mg by mouth every morning.     ranitidine 150 MG capsule  Commonly known as:  ZANTAC  Take 150 mg by mouth daily as needed (for indigestion).         At home rest most of the time. Get up 9 or 10 times each day and take a 15 or 20 minute walk. No riding in the car and to your first postoperative appointment. If you  have neck surgery you may shower from the chest down starting on the third postoperative day. If you had back surgery he may start showering on the third postoperative day with saran wrap wrapped around your incisional area 3 times. After the shower remove the saran wrap. Take pain medicine as needed and other medications as instructed. Call my office for an appointment.  SignedReinaldo Meeker, MD 04/04/2013, 11:28 AM

## 2013-07-29 ENCOUNTER — Other Ambulatory Visit (HOSPITAL_COMMUNITY): Payer: Self-pay | Admitting: Family Medicine

## 2013-07-29 DIAGNOSIS — Z1231 Encounter for screening mammogram for malignant neoplasm of breast: Secondary | ICD-10-CM

## 2013-08-26 ENCOUNTER — Ambulatory Visit (HOSPITAL_COMMUNITY)
Admission: RE | Admit: 2013-08-26 | Discharge: 2013-08-26 | Disposition: A | Discharge status: Home / Self Care | Payer: Medicare Other | Source: Ambulatory Visit | Attending: Family Medicine | Admitting: Family Medicine

## 2013-08-26 DIAGNOSIS — Z1231 Encounter for screening mammogram for malignant neoplasm of breast: Secondary | ICD-10-CM

## 2014-01-20 ENCOUNTER — Other Ambulatory Visit: Payer: Self-pay | Admitting: Specialist

## 2014-01-20 DIAGNOSIS — M545 Low back pain, unspecified: Secondary | ICD-10-CM

## 2014-01-26 ENCOUNTER — Ambulatory Visit
Admission: RE | Admit: 2014-01-26 | Discharge: 2014-01-26 | Disposition: A | Discharge status: Home / Self Care | Payer: 59 | Source: Ambulatory Visit | Attending: Specialist | Admitting: Specialist

## 2014-01-26 DIAGNOSIS — M545 Low back pain, unspecified: Secondary | ICD-10-CM

## 2014-01-26 MED ORDER — GADOXETATE DISODIUM 0.25 MMOL/ML IV SOLN: 6.0000 mL | Freq: Once | INTRAVENOUS | Status: AC | PRN

## 2014-01-26 MED ORDER — GADOBENATE DIMEGLUMINE 529 MG/ML IV SOLN
13.0000 mL | Freq: Once | INTRAVENOUS | Status: AC | PRN
  Administered 2014-01-26: 13 mL via INTRAVENOUS

## 2014-09-14 ENCOUNTER — Encounter (HOSPITAL_BASED_OUTPATIENT_CLINIC_OR_DEPARTMENT_OTHER): Payer: Self-pay | Admitting: Emergency Medicine

## 2014-09-14 ENCOUNTER — Emergency Department (HOSPITAL_BASED_OUTPATIENT_CLINIC_OR_DEPARTMENT_OTHER): Payer: Medicare Other

## 2014-09-14 ENCOUNTER — Emergency Department (HOSPITAL_BASED_OUTPATIENT_CLINIC_OR_DEPARTMENT_OTHER)
Admission: EM | Admit: 2014-09-14 | Discharge: 2014-09-14 | Disposition: A | Discharge status: Home / Self Care | Payer: Medicare Other | Attending: Emergency Medicine | Admitting: Emergency Medicine

## 2014-09-14 DIAGNOSIS — S0990XA Unspecified injury of head, initial encounter: Secondary | ICD-10-CM | POA: Insufficient documentation

## 2014-09-14 DIAGNOSIS — Z87891 Personal history of nicotine dependence: Secondary | ICD-10-CM | POA: Diagnosis not present

## 2014-09-14 DIAGNOSIS — S79912A Unspecified injury of left hip, initial encounter: Secondary | ICD-10-CM | POA: Diagnosis not present

## 2014-09-14 DIAGNOSIS — S3992XA Unspecified injury of lower back, initial encounter: Secondary | ICD-10-CM | POA: Diagnosis not present

## 2014-09-14 DIAGNOSIS — M1990 Unspecified osteoarthritis, unspecified site: Secondary | ICD-10-CM | POA: Insufficient documentation

## 2014-09-14 DIAGNOSIS — S3991XA Unspecified injury of abdomen, initial encounter: Secondary | ICD-10-CM | POA: Insufficient documentation

## 2014-09-14 DIAGNOSIS — M797 Fibromyalgia: Secondary | ICD-10-CM | POA: Insufficient documentation

## 2014-09-14 DIAGNOSIS — M79602 Pain in left arm: Secondary | ICD-10-CM

## 2014-09-14 DIAGNOSIS — E039 Hypothyroidism, unspecified: Secondary | ICD-10-CM | POA: Diagnosis not present

## 2014-09-14 DIAGNOSIS — F419 Anxiety disorder, unspecified: Secondary | ICD-10-CM | POA: Insufficient documentation

## 2014-09-14 DIAGNOSIS — Y9389 Activity, other specified: Secondary | ICD-10-CM | POA: Insufficient documentation

## 2014-09-14 DIAGNOSIS — Z9049 Acquired absence of other specified parts of digestive tract: Secondary | ICD-10-CM | POA: Diagnosis not present

## 2014-09-14 DIAGNOSIS — K219 Gastro-esophageal reflux disease without esophagitis: Secondary | ICD-10-CM | POA: Diagnosis not present

## 2014-09-14 DIAGNOSIS — S4992XA Unspecified injury of left shoulder and upper arm, initial encounter: Secondary | ICD-10-CM | POA: Insufficient documentation

## 2014-09-14 DIAGNOSIS — Y92410 Unspecified street and highway as the place of occurrence of the external cause: Secondary | ICD-10-CM | POA: Diagnosis not present

## 2014-09-14 DIAGNOSIS — Z9851 Tubal ligation status: Secondary | ICD-10-CM | POA: Diagnosis not present

## 2014-09-14 DIAGNOSIS — Y998 Other external cause status: Secondary | ICD-10-CM | POA: Insufficient documentation

## 2014-09-14 DIAGNOSIS — Z79899 Other long term (current) drug therapy: Secondary | ICD-10-CM | POA: Diagnosis not present

## 2014-09-14 LAB — URINALYSIS, ROUTINE W REFLEX MICROSCOPIC
Bilirubin Urine: NEGATIVE
GLUCOSE, UA: NEGATIVE mg/dL
Ketones, ur: 15 mg/dL — AB
NITRITE: NEGATIVE
Protein, ur: NEGATIVE mg/dL
Specific Gravity, Urine: 1.018 (ref 1.005–1.030)
Urobilinogen, UA: 0.2 mg/dL (ref 0.0–1.0)
pH: 5.5 (ref 5.0–8.0)

## 2014-09-14 LAB — BASIC METABOLIC PANEL
Anion gap: 9 (ref 5–15)
BUN: 13 mg/dL (ref 6–23)
CALCIUM: 9.2 mg/dL (ref 8.4–10.5)
CO2: 27 mmol/L (ref 19–32)
Chloride: 103 mmol/L (ref 96–112)
Creatinine, Ser: 1.08 mg/dL (ref 0.50–1.10)
GFR, EST AFRICAN AMERICAN: 59 mL/min — AB (ref >90)
GFR, EST NON AFRICAN AMERICAN: 51 mL/min — AB (ref >90)
Glucose, Bld: 94 mg/dL (ref 70–99)
POTASSIUM: 3.6 mmol/L (ref 3.5–5.1)
Sodium: 139 mmol/L (ref 135–145)

## 2014-09-14 LAB — CBC WITH DIFFERENTIAL/PLATELET
Basophils Absolute: 0 10*3/uL (ref 0.0–0.1)
Basophils Relative: 0 % (ref 0–1)
Eosinophils Absolute: 0 10*3/uL (ref 0.0–0.7)
Eosinophils Relative: 0 % (ref 0–5)
HCT: 39.6 % (ref 36.0–46.0)
Hemoglobin: 13 g/dL (ref 12.0–15.0)
LYMPHS PCT: 28 % (ref 12–46)
Lymphs Abs: 2 10*3/uL (ref 0.7–4.0)
MCH: 30.5 pg (ref 26.0–34.0)
MCHC: 32.8 g/dL (ref 30.0–36.0)
MCV: 93 fL (ref 78.0–100.0)
MONO ABS: 0.8 10*3/uL (ref 0.1–1.0)
MONOS PCT: 12 % (ref 3–12)
NEUTROS PCT: 60 % (ref 43–77)
Neutro Abs: 4.2 10*3/uL (ref 1.7–7.7)
Platelets: 316 10*3/uL (ref 150–400)
RBC: 4.26 MIL/uL (ref 3.87–5.11)
RDW: 13.3 % (ref 11.5–15.5)
WBC: 7 10*3/uL (ref 4.0–10.5)

## 2014-09-14 LAB — URINE MICROSCOPIC-ADD ON

## 2014-09-14 MED ORDER — IOHEXOL 300 MG/ML  SOLN
100.0000 mL | Freq: Once | INTRAMUSCULAR | Status: AC | PRN
  Administered 2014-09-14: 100 mL via INTRAVENOUS
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
## 2014-09-14 NOTE — ED Notes (Signed)
MD at bedside. 

## 2014-09-14 NOTE — ED Notes (Signed)
Patient transported to CT 

## 2014-09-14 NOTE — Discharge Instructions (Signed)

## 2014-09-25 ENCOUNTER — Other Ambulatory Visit (HOSPITAL_COMMUNITY): Payer: Self-pay | Admitting: Family Medicine

## 2014-09-25 DIAGNOSIS — Z1231 Encounter for screening mammogram for malignant neoplasm of breast: Secondary | ICD-10-CM

## 2014-09-28 ENCOUNTER — Ambulatory Visit (HOSPITAL_COMMUNITY)
Admission: RE | Admit: 2014-09-28 | Discharge: 2014-09-28 | Disposition: A | Discharge status: Home / Self Care | Payer: Medicare Other | Source: Ambulatory Visit | Attending: Family Medicine | Admitting: Family Medicine

## 2014-09-28 DIAGNOSIS — Z1231 Encounter for screening mammogram for malignant neoplasm of breast: Secondary | ICD-10-CM | POA: Insufficient documentation

## 2014-12-21 ENCOUNTER — Other Ambulatory Visit: Payer: Self-pay

## 2015-04-09 ENCOUNTER — Emergency Department (HOSPITAL_COMMUNITY): Payer: Medicare Other

## 2015-04-09 ENCOUNTER — Ambulatory Visit (HOSPITAL_BASED_OUTPATIENT_CLINIC_OR_DEPARTMENT_OTHER)
Admission: RE | Admit: 2015-04-09 | Discharge: 2015-04-09 | Disposition: A | Discharge status: Home / Self Care | Payer: Medicare Other | Source: Ambulatory Visit | Attending: Emergency Medicine | Admitting: Emergency Medicine

## 2015-04-09 ENCOUNTER — Emergency Department (HOSPITAL_BASED_OUTPATIENT_CLINIC_OR_DEPARTMENT_OTHER)
Admission: EM | Admit: 2015-04-09 | Discharge: 2015-04-09 | Disposition: A | Discharge status: Home / Self Care | Payer: Medicare Other | Attending: Emergency Medicine | Admitting: Emergency Medicine

## 2015-04-09 ENCOUNTER — Encounter (HOSPITAL_BASED_OUTPATIENT_CLINIC_OR_DEPARTMENT_OTHER): Payer: Self-pay

## 2015-04-09 ENCOUNTER — Emergency Department (HOSPITAL_BASED_OUTPATIENT_CLINIC_OR_DEPARTMENT_OTHER): Payer: Medicare Other

## 2015-04-09 DIAGNOSIS — F419 Anxiety disorder, unspecified: Secondary | ICD-10-CM | POA: Diagnosis not present

## 2015-04-09 DIAGNOSIS — T07XXXA Unspecified multiple injuries, initial encounter: Secondary | ICD-10-CM

## 2015-04-09 DIAGNOSIS — S60811A Abrasion of right wrist, initial encounter: Secondary | ICD-10-CM | POA: Insufficient documentation

## 2015-04-09 DIAGNOSIS — Y9389 Activity, other specified: Secondary | ICD-10-CM | POA: Insufficient documentation

## 2015-04-09 DIAGNOSIS — Z23 Encounter for immunization: Secondary | ICD-10-CM | POA: Diagnosis not present

## 2015-04-09 DIAGNOSIS — Z79899 Other long term (current) drug therapy: Secondary | ICD-10-CM | POA: Diagnosis not present

## 2015-04-09 DIAGNOSIS — Y998 Other external cause status: Secondary | ICD-10-CM | POA: Diagnosis not present

## 2015-04-09 DIAGNOSIS — S6992XA Unspecified injury of left wrist, hand and finger(s), initial encounter: Secondary | ICD-10-CM | POA: Diagnosis present

## 2015-04-09 DIAGNOSIS — S60812A Abrasion of left wrist, initial encounter: Secondary | ICD-10-CM | POA: Insufficient documentation

## 2015-04-09 DIAGNOSIS — J3489 Other specified disorders of nose and nasal sinuses: Secondary | ICD-10-CM | POA: Insufficient documentation

## 2015-04-09 DIAGNOSIS — S0083XA Contusion of other part of head, initial encounter: Secondary | ICD-10-CM | POA: Diagnosis not present

## 2015-04-09 DIAGNOSIS — W010XXA Fall on same level from slipping, tripping and stumbling without subsequent striking against object, initial encounter: Secondary | ICD-10-CM | POA: Insufficient documentation

## 2015-04-09 DIAGNOSIS — E039 Hypothyroidism, unspecified: Secondary | ICD-10-CM | POA: Insufficient documentation

## 2015-04-09 DIAGNOSIS — Y9289 Other specified places as the place of occurrence of the external cause: Secondary | ICD-10-CM | POA: Insufficient documentation

## 2015-04-09 DIAGNOSIS — R0982 Postnasal drip: Secondary | ICD-10-CM | POA: Diagnosis not present

## 2015-04-09 DIAGNOSIS — J029 Acute pharyngitis, unspecified: Secondary | ICD-10-CM | POA: Insufficient documentation

## 2015-04-09 DIAGNOSIS — S199XXA Unspecified injury of neck, initial encounter: Secondary | ICD-10-CM | POA: Insufficient documentation

## 2015-04-09 DIAGNOSIS — Z87891 Personal history of nicotine dependence: Secondary | ICD-10-CM | POA: Diagnosis not present

## 2015-04-09 DIAGNOSIS — K219 Gastro-esophageal reflux disease without esophagitis: Secondary | ICD-10-CM | POA: Diagnosis not present

## 2015-04-09 DIAGNOSIS — M1990 Unspecified osteoarthritis, unspecified site: Secondary | ICD-10-CM | POA: Diagnosis not present

## 2015-04-09 DIAGNOSIS — S8002XA Contusion of left knee, initial encounter: Secondary | ICD-10-CM | POA: Diagnosis not present

## 2015-04-09 DIAGNOSIS — R0981 Nasal congestion: Secondary | ICD-10-CM | POA: Diagnosis not present

## 2015-04-09 DIAGNOSIS — S3992XA Unspecified injury of lower back, initial encounter: Secondary | ICD-10-CM | POA: Insufficient documentation

## 2015-04-09 DIAGNOSIS — R42 Dizziness and giddiness: Secondary | ICD-10-CM | POA: Diagnosis not present

## 2015-04-09 DIAGNOSIS — W19XXXA Unspecified fall, initial encounter: Secondary | ICD-10-CM

## 2015-04-09 DIAGNOSIS — S60222A Contusion of left hand, initial encounter: Secondary | ICD-10-CM | POA: Diagnosis not present

## 2015-04-09 HISTORY — DX: Spinal stenosis, site unspecified: M48.00

## 2015-04-09 MED ORDER — TETANUS-DIPHTH-ACELL PERTUSSIS 5-2.5-18.5 LF-MCG/0.5 IM SUSP
0.5000 mL | Freq: Once | INTRAMUSCULAR | Status: AC
Start: 2015-04-09 — End: 2015-04-09
  Administered 2015-04-09: 0.5 mL via INTRAMUSCULAR
  Filled 2015-04-09: qty 0.5

## 2015-04-09 NOTE — ED Provider Notes (Addendum)
CSN: [id number]     Arrival date & time 04/09/15  1537 History   First MD Initiated Contact with Patient 04/09/15 1542     Chief Complaint  Patient presents with  . Fall     (Consider location/radiation/quality/duration/timing/severity/associated sxs/prior Treatment) HPI Comments: Patient presents after a fall. She was walking [place] the court [place] and tripped on an elevated piece of sidewalk. She fell flat on her face. She has pain and swelling to the right side of her face. She also had her hands out he complains of pain primarily to her left hand as well as her left knee. She feels a little soreness in her knee. She has chronic pain in her low back which she says is unchanged from her baseline. She denies any numbness or weakness in her extremities. She denies any loss of consciousness, but does say that she feels dizzy. She denies any nausea or vomiting. She's not on anticoagulants.  Patient is a 69 y.o. female presenting with fall.  Fall Pertinent negatives include no chest pain, no abdominal pain, no headaches and no shortness of breath.    Past Medical History  Diagnosis Date  . PONV (postoperative nausea and vomiting)   . Hypothyroidism   . Fibromyalgia   . Anxiety   . GERD (gastroesophageal reflux disease)     bucchini  . Arthritis   . Diverticulitis   . Spinal stenosis    Past Surgical History  Procedure Laterality Date  . Colon surgery      foot of colon removed - diverticulitis  . Joint replacement Right     knee  . Knee arthroscopic Right   . Bone spur shoulder Bilateral   . Trigger finger release Right     x2  . Eye surgery Left     cross-eye fixed  . Appendectomy    . Cholecystectomy    . Tubal ligation    . Breast surgery Right     lumpectomy non cancerous  . Lumbar laminectomy/decompression microdiscectomy N/A 04/03/2013    Procedure: Lumbar Four-Five Lumbar diskectomy with Coflex;  Surgeon: Reinaldo Meeker, MD;  Location: [facility];  Service:  Neurosurgery;  Laterality: N/A;  L4-5 Lumbar diskectomy with Coflex  . Back surgery     No family history on file. Social History  Substance Use Topics  . Smoking status: Former Games developer  . Smokeless tobacco: None  . Alcohol Use: Yes     Comment: social   OB History    No data available     Review of Systems  Constitutional: Negative for fever, chills, diaphoresis and fatigue.  HENT: Positive for congestion, postnasal drip, rhinorrhea and sore throat. Negative for sneezing.   Eyes: Negative.   Respiratory: Negative for cough, chest tightness and shortness of breath.   Cardiovascular: Negative for chest pain and leg swelling.  Gastrointestinal: Negative for nausea, vomiting, abdominal pain, diarrhea and blood in stool.  Genitourinary: Negative for frequency, hematuria, flank pain and difficulty urinating.  Musculoskeletal: Positive for back pain (at baseline), arthralgias and neck pain.  Skin: Positive for wound. Negative for rash.  Neurological: Positive for dizziness. Negative for speech difficulty, weakness, numbness and headaches.      Allergies  Sulfa antibiotics  Home Medications   Prior to Admission medications   Medication Sig Start Date End Date Taking? Authorizing Provider  citalopram (CELEXA) 20 MG tablet Take 20 mg by mouth every morning.    Historical Provider, MD  HYDROcodone-acetaminophen (NORCO/VICODIN) 5-325 MG per tablet  Take 1 tablet by mouth 2 (two) times daily as needed for pain.    Historical Provider, MD  levothyroxine (SYNTHROID, LEVOTHROID) 75 MCG tablet Take 75 mcg by mouth daily before breakfast.    Historical Provider, MD  pantoprazole (PROTONIX) 40 MG tablet Take 40 mg by mouth every morning.    Historical Provider, MD   BP 100/62 mmHg  Pulse 94  Temp(Src) 97.8 F (36.6 C) (Oral)  Resp 18  Ht 5\' 3"  (1.6 m)  Wt 136 lb (61.689 kg)  BMI 24.10 kg/m2  SpO2 99% Physical Exam  Constitutional: She is oriented to person, place, and time. She  appears well-developed and well-nourished.  HENT:  Head: Normocephalic and atraumatic.  Mouth/Throat: Oropharynx is clear and moist.  No erythema or exudates  Eyes: Pupils are equal, round, and reactive to light.  Neck:  Mild tenderness to the mid cervical spine, no significant tenderness to the thoracic spine. She has some mild tenderness to the LS-spine but states this is chronic and unchanged from her baseline. No step-offs or deformities are noted.  Cardiovascular: Normal rate, regular rhythm and normal heart sounds.   Pulmonary/Chest: Effort normal and breath sounds normal. No respiratory distress. She has no wheezes. She has no rales. She exhibits no tenderness.  Abdominal: Soft. Bowel sounds are normal. There is no tenderness. There is no rebound and no guarding.  Musculoskeletal: Normal range of motion. She exhibits no edema.  Positive abrasions to the 4 surfaces of both wrists. There is ecchymosis swelling and tenderness over the fifth metacarpal of the left hand. There is no tenderness to the wrist bones. There is no pain to the elbow or shoulder. There is no bony tenderness on palpation of the right upper extremity. There is no pain on range of motion of the hips. There is tenderness and some mild swelling to the anterior aspect of the left knee with some underlying ecchymosis. There is no other pain on palpation or range of motion of the extremities.  Lymphadenopathy:    She has no cervical adenopathy.  Neurological: She is alert and oriented to person, place, and time.  Skin: Skin is warm and dry. No rash noted.  Psychiatric: She has a normal mood and affect.    ED Course  Procedures (including critical care time) Labs Review Labs Reviewed - No data to display  Imaging Review Ct Head Wo Contrast  04/09/2015  CLINICAL DATA:  Tripped on the sidewalk and fell landing on right side of face. Tenderness and pain along the right lateral side of the face. EXAM: CT HEAD WITHOUT  CONTRAST CT MAXILLOFACIAL WITHOUT CONTRAST CT CERVICAL SPINE WITHOUT CONTRAST TECHNIQUE: Multidetector CT imaging of the head, cervical spine, and maxillofacial structures were performed using the standard protocol without intravenous contrast. Multiplanar CT image reconstructions of the cervical spine and maxillofacial structures were also generated. COMPARISON:  None. FINDINGS: CT HEAD FINDINGS Mild atrophy and white matter changes are within normal limits for age. No acute infarct, hemorrhage, or mass lesion is present. Ventricles are of normal size. No significant extra-axial fluid collection is present. The paranasal sinuses are clear. There is some fluid in the inferior right mastoid air cells where chronic sclerotic changes are noted. The remaining right mastoid air cells and the right middle ear cavity are clear. CT MAXILLOFACIAL FINDINGS Soft tissue swelling is present over the right side of the face. There is no underlying fracture. The mandible is intact and located. Mild degenerative changes are present at the TMJ.  The paranasal sinuses are clear. There is some fluid and chronic sclerosis at the inferior aspect right mastoid. Facial bones are otherwise intact. CT CERVICAL SPINE FINDINGS Cervical spine is imaged from the skullbase through T3-4. Mild endplate degenerative changes and uncovertebral spurring is present at C5-6 and C6-7. No acute fracture or traumatic subluxation is present. There is some straightening of the normal cervical lordosis. The soft tissues the neck are unremarkable. Mild emphysematous changes are noted at the lung apices. IMPRESSION: 1. Normal CT appearance of the brain for age. 2. Minimal fluid in the inferior right mastoid air cells with chronic sclerotic changes. 3. Degenerative changes at the TMJ. 4. Degenerative changes in the cervical spine, most evident at C5-6 and C6-7. 5. No acute fracture. 6. Soft tissue swelling over the right side of the face. The mandible is intact  and located. Electronically Signed   By: Marin Robertshristopher  Mattern M.D.   On: 04/09/2015 16:40   Ct Cervical Spine Wo Contrast  04/09/2015  CLINICAL DATA:  Tripped on the sidewalk and fell landing on right side of face. Tenderness and pain along the right lateral side of the face. EXAM: CT HEAD WITHOUT CONTRAST CT MAXILLOFACIAL WITHOUT CONTRAST CT CERVICAL SPINE WITHOUT CONTRAST TECHNIQUE: Multidetector CT imaging of the head, cervical spine, and maxillofacial structures were performed using the standard protocol without intravenous contrast. Multiplanar CT image reconstructions of the cervical spine and maxillofacial structures were also generated. COMPARISON:  None. FINDINGS: CT HEAD FINDINGS Mild atrophy and white matter changes are within normal limits for age. No acute infarct, hemorrhage, or mass lesion is present. Ventricles are of normal size. No significant extra-axial fluid collection is present. The paranasal sinuses are clear. There is some fluid in the inferior right mastoid air cells where chronic sclerotic changes are noted. The remaining right mastoid air cells and the right middle ear cavity are clear. CT MAXILLOFACIAL FINDINGS Soft tissue swelling is present over the right side of the face. There is no underlying fracture. The mandible is intact and located. Mild degenerative changes are present at the TMJ. The paranasal sinuses are clear. There is some fluid and chronic sclerosis at the inferior aspect right mastoid. Facial bones are otherwise intact. CT CERVICAL SPINE FINDINGS Cervical spine is imaged from the skullbase through T3-4. Mild endplate degenerative changes and uncovertebral spurring is present at C5-6 and C6-7. No acute fracture or traumatic subluxation is present. There is some straightening of the normal cervical lordosis. The soft tissues the neck are unremarkable. Mild emphysematous changes are noted at the lung apices. IMPRESSION: 1. Normal CT appearance of the brain for age. 2.  Minimal fluid in the inferior right mastoid air cells with chronic sclerotic changes. 3. Degenerative changes at the TMJ. 4. Degenerative changes in the cervical spine, most evident at C5-6 and C6-7. 5. No acute fracture. 6. Soft tissue swelling over the right side of the face. The mandible is intact and located. Electronically Signed   By: Marin Robertshristopher  Mattern M.D.   On: 04/09/2015 16:40   Dg Hand Complete Left  04/09/2015  CLINICAL DATA:  Fifth metacarpal pain in the left hand after fall. EXAM: LEFT HAND - COMPLETE 3+ VIEW COMPARISON:  None. FINDINGS: No fracture, dislocation or suspicious focal osseous lesion. Diffuse osteopenia. Mild osteoarthritis in the interphalangeal joint left thumb and at the first carpometacarpal joint. No pathologic soft tissue calcifications. IMPRESSION: No fracture or dislocation in the left hand. Mild polyarticular osteoarthritis as described. Electronically Signed   By: Delbert PhenixJason A Poff  M.D.   On: 04/09/2015 16:55   Ct Maxillofacial Wo Cm  04/09/2015  CLINICAL DATA:  Tripped on the sidewalk and fell landing on right side of face. Tenderness and pain along the right lateral side of the face. EXAM: CT HEAD WITHOUT CONTRAST CT MAXILLOFACIAL WITHOUT CONTRAST CT CERVICAL SPINE WITHOUT CONTRAST TECHNIQUE: Multidetector CT imaging of the head, cervical spine, and maxillofacial structures were performed using the standard protocol without intravenous contrast. Multiplanar CT image reconstructions of the cervical spine and maxillofacial structures were also generated. COMPARISON:  None. FINDINGS: CT HEAD FINDINGS Mild atrophy and white matter changes are within normal limits for age. No acute infarct, hemorrhage, or mass lesion is present. Ventricles are of normal size. No significant extra-axial fluid collection is present. The paranasal sinuses are clear. There is some fluid in the inferior right mastoid air cells where chronic sclerotic changes are noted. The remaining right mastoid  air cells and the right middle ear cavity are clear. CT MAXILLOFACIAL FINDINGS Soft tissue swelling is present over the right side of the face. There is no underlying fracture. The mandible is intact and located. Mild degenerative changes are present at the TMJ. The paranasal sinuses are clear. There is some fluid and chronic sclerosis at the inferior aspect right mastoid. Facial bones are otherwise intact. CT CERVICAL SPINE FINDINGS Cervical spine is imaged from the skullbase through T3-4. Mild endplate degenerative changes and uncovertebral spurring is present at C5-6 and C6-7. No acute fracture or traumatic subluxation is present. There is some straightening of the normal cervical lordosis. The soft tissues the neck are unremarkable. Mild emphysematous changes are noted at the lung apices. IMPRESSION: 1. Normal CT appearance of the brain for age. 2. Minimal fluid in the inferior right mastoid air cells with chronic sclerotic changes. 3. Degenerative changes at the TMJ. 4. Degenerative changes in the cervical spine, most evident at C5-6 and C6-7. 5. No acute fracture. 6. Soft tissue swelling over the right side of the face. The mandible is intact and located. Electronically Signed   By: Marin Roberts M.D.   On: 04/09/2015 16:40   I have personally reviewed and evaluated these images and lab results as part of my medical decision-making.   EKG Interpretation None      MDM   Final diagnoses:  Hand contusion, left, initial encounter  Facial contusion, initial encounter  Abrasions of multiple sites    Patient presents after mechanical fall. There is no evident fractures. There is no intracranial hemorrhage. No evident spinal injuries. She has some small abrasions to her hands. These been bandaged. Her tetanus shot was updated. She initially had reports of dizziness but this seems to have resolved. She is ambulating without difficulty. A volar splint was placed to her left arm. She was advised  in ice and elevation. She states that she has hydrocodone to use at home for pain. She was advised to follow-up with her PCP if her symptoms are not improving.  Pt's x-ray of the left knee was cancelled as pt did not feel that she needed an x-ray.    Rolan Bucco, MD 04/09/15 1710  Rolan Bucco, MD 04/09/15 [phone]

## 2015-04-09 NOTE — Discharge Instructions (Signed)
Facial or Scalp Contusion A facial or scalp contusion is a deep bruise on the face or head. Injuries to the face and head generally cause a lot of swelling, especially around the eyes. Contusions are the result of an injury that caused bleeding under the skin. The contusion may turn blue, purple, or yellow. Minor injuries will give you a painless contusion, but more severe contusions may stay painful and swollen for a few weeks.  CAUSES  A facial or scalp contusion is caused by a blunt injury or trauma to the face or head area.  SIGNS AND SYMPTOMS   Swelling of the injured area.   Discoloration of the injured area.   Tenderness, soreness, or pain in the injured area.  DIAGNOSIS  The diagnosis can be made by taking a medical history and doing a physical exam. An X-ray exam, CT scan, or MRI may be needed to determine if there are any associated injuries, such as broken bones (fractures). TREATMENT  Often, the best treatment for a facial or scalp contusion is applying cold compresses to the injured area. Over-the-counter medicines may also be recommended for pain control.  HOME CARE INSTRUCTIONS   Only take over-the-counter or prescription medicines as directed by your health care provider.   Apply ice to the injured area.   Put ice in a plastic bag.   Place a towel between your skin and the bag.   Leave the ice on for 20 minutes, 2-3 times a day.  SEEK MEDICAL CARE IF:  You have bite problems.   You have pain with chewing.   You are concerned about facial defects. SEEK IMMEDIATE MEDICAL CARE IF:  You have severe pain or a headache that is not relieved by medicine.   You have unusual sleepiness, confusion, or personality changes.   You throw up (vomit).   You have a persistent nosebleed.   You have double vision or blurred vision.   You have fluid drainage from your nose or ear.   You have difficulty walking or using your arms or legs.  MAKE SURE YOU:    Understand these instructions.  Will watch your condition.  Will get help right away if you are not doing well or get worse.   This information is not intended to replace advice given to you by your health care provider. Make sure you discuss any questions you have with your health care provider.   Document Released: 07/20/2004 Document Revised: 07/03/2014 Document Reviewed: 01/23/2013 Elsevier Interactive Patient Education 2016 [employer]  Hand Contusion A hand contusion is a deep bruise on your hand area. Contusions are the result of an injury that caused bleeding under the skin. The contusion may turn blue, purple, or yellow. Minor injuries will give you a painless contusion, but more severe contusions may stay painful and swollen for a few weeks. CAUSES  A contusion is usually caused by a blow, trauma, or direct force to an area of the body. SYMPTOMS   Swelling and redness of the injured area.  Discoloration of the injured area.  Tenderness and soreness of the injured area.  Pain. DIAGNOSIS  The diagnosis can be made by taking a history and performing a physical exam. An X-ray, CT scan, or MRI may be needed to determine if there were any associated injuries, such as broken bones (fractures). TREATMENT  Often, the best treatment for a hand contusion is resting, elevating, icing, and applying cold compresses to the injured area. Over-the-counter medicines may also be  recommended for pain control. HOME CARE INSTRUCTIONS   Put ice on the injured area.  Put ice in a plastic bag.  Place a towel between your skin and the bag.  Leave the ice on for 15-20 minutes, 03-04 times a day.  Only take over-the-counter or prescription medicines as directed by your caregiver. Your caregiver may recommend avoiding anti-inflammatory medicines (aspirin, ibuprofen, and naproxen) for 48 hours because these medicines may increase bruising.  If told, use an elastic wrap as directed. This  can help reduce swelling. You may remove the wrap for sleeping, showering, and bathing. If your fingers become numb, cold, or blue, take the wrap off and reapply it more loosely.  Elevate your hand with pillows to reduce swelling.  Avoid overusing your hand if it is painful. SEEK IMMEDIATE MEDICAL CARE IF:   You have increased redness, swelling, or pain in your hand.  Your swelling or pain is not relieved with medicines.  You have loss of feeling in your hand or are unable to move your fingers.  Your hand turns cold or blue.  You have pain when you move your fingers.  Your hand becomes warm to the touch.  Your contusion does not improve in 2 days. MAKE SURE YOU:   Understand these instructions.  Will watch your condition.  Will get help right away if you are not doing well or get worse.   This information is not intended to replace advice given to you by your health care provider. Make sure you discuss any questions you have with your health care provider.   Document Released: 12/02/2001 Document Revised: 03/06/2012 Document Reviewed: 12/04/2011 Elsevier Interactive Patient Education [employer]

## 2015-04-09 NOTE — ED Notes (Signed)
Trip/fall on sidewalk today-pain to right side of face, left knee

## 2015-04-13 ENCOUNTER — Encounter (HOSPITAL_BASED_OUTPATIENT_CLINIC_OR_DEPARTMENT_OTHER): Payer: Self-pay | Admitting: Emergency Medicine

## 2015-04-13 ENCOUNTER — Emergency Department (HOSPITAL_BASED_OUTPATIENT_CLINIC_OR_DEPARTMENT_OTHER): Payer: Medicare Other

## 2015-04-13 ENCOUNTER — Emergency Department (HOSPITAL_BASED_OUTPATIENT_CLINIC_OR_DEPARTMENT_OTHER)
Admission: EM | Admit: 2015-04-13 | Discharge: 2015-04-13 | Disposition: A | Discharge status: Home / Self Care | Payer: Medicare Other | Attending: Emergency Medicine | Admitting: Emergency Medicine

## 2015-04-13 DIAGNOSIS — E039 Hypothyroidism, unspecified: Secondary | ICD-10-CM | POA: Diagnosis not present

## 2015-04-13 DIAGNOSIS — K219 Gastro-esophageal reflux disease without esophagitis: Secondary | ICD-10-CM | POA: Diagnosis not present

## 2015-04-13 DIAGNOSIS — Y9289 Other specified places as the place of occurrence of the external cause: Secondary | ICD-10-CM | POA: Insufficient documentation

## 2015-04-13 DIAGNOSIS — S6391XA Sprain of unspecified part of right wrist and hand, initial encounter: Secondary | ICD-10-CM | POA: Diagnosis not present

## 2015-04-13 DIAGNOSIS — F419 Anxiety disorder, unspecified: Secondary | ICD-10-CM | POA: Diagnosis not present

## 2015-04-13 DIAGNOSIS — Y998 Other external cause status: Secondary | ICD-10-CM | POA: Diagnosis not present

## 2015-04-13 DIAGNOSIS — Y9389 Activity, other specified: Secondary | ICD-10-CM | POA: Diagnosis not present

## 2015-04-13 DIAGNOSIS — S6991XA Unspecified injury of right wrist, hand and finger(s), initial encounter: Secondary | ICD-10-CM | POA: Diagnosis present

## 2015-04-13 DIAGNOSIS — Z87891 Personal history of nicotine dependence: Secondary | ICD-10-CM | POA: Insufficient documentation

## 2015-04-13 DIAGNOSIS — M1990 Unspecified osteoarthritis, unspecified site: Secondary | ICD-10-CM | POA: Insufficient documentation

## 2015-04-13 DIAGNOSIS — W1839XA Other fall on same level, initial encounter: Secondary | ICD-10-CM | POA: Diagnosis not present

## 2015-04-13 DIAGNOSIS — Z79899 Other long term (current) drug therapy: Secondary | ICD-10-CM | POA: Insufficient documentation

## 2015-04-13 NOTE — ED Provider Notes (Addendum)
CSN: [id number]     Arrival date & time 04/13/15  1303 History   First MD Initiated Contact with Patient 04/13/15 1316     Chief Complaint  Patient presents with  . Wrist Pain     (Consider location/radiation/quality/duration/timing/severity/associated sxs/prior Treatment) Patient is a 69 y.o. female presenting with hand injury. The history is provided by the patient.  Hand Injury Location:  Hand Time since incident:  4 days Injury: yes   Mechanism of injury: fall   Fall:    Fall occurred:  Walking and tripped   Impact surface:  Hard floor   Point of impact:  Outstretched arms and face Hand location:  R palm Pain details:    Quality:  Aching and shooting   Radiates to: right thumb.   Severity:  Moderate   Onset quality:  Gradual   Timing:  Constant   Progression:  Unchanged Chronicity:  New Handedness:  Right-handed Relieved by:  Rest Worsened by:  Movement Ineffective treatments:  None tried Associated symptoms: swelling   Associated symptoms: no decreased range of motion     Past Medical History  Diagnosis Date  . PONV (postoperative nausea and vomiting)   . Hypothyroidism   . Fibromyalgia   . Anxiety   . GERD (gastroesophageal reflux disease)     bucchini  . Arthritis   . Diverticulitis   . Spinal stenosis    Past Surgical History  Procedure Laterality Date  . Colon surgery      foot of colon removed - diverticulitis  . Joint replacement Right     knee  . Knee arthroscopic Right   . Bone spur shoulder Bilateral   . Trigger finger release Right     x2  . Eye surgery Left     cross-eye fixed  . Appendectomy    . Cholecystectomy    . Tubal ligation    . Breast surgery Right     lumpectomy non cancerous  . Lumbar laminectomy/decompression microdiscectomy N/A 04/03/2013    Procedure: Lumbar Four-Five Lumbar diskectomy with Coflex;  Surgeon: Reinaldo Meekerandy O Kritzer, MD;  Location: [facility];  Service: Neurosurgery;  Laterality: N/A;  L4-5 Lumbar diskectomy  with Coflex  . Back surgery     History reviewed. No pertinent family history. Social History  Substance Use Topics  . Smoking status: Former Games developermoker  . Smokeless tobacco: None  . Alcohol Use: Yes     Comment: social   OB History    No data available     Review of Systems  All other systems reviewed and are negative.     Allergies  Sulfa antibiotics  Home Medications   Prior to Admission medications   Medication Sig Start Date End Date Taking? Authorizing Provider  citalopram (CELEXA) 20 MG tablet Take 20 mg by mouth every morning.   Yes Historical Provider, MD  HYDROcodone-acetaminophen (NORCO/VICODIN) 5-325 MG per tablet Take 1 tablet by mouth 2 (two) times daily as needed for pain.   Yes Historical Provider, MD  levothyroxine (SYNTHROID, LEVOTHROID) 75 MCG tablet Take 75 mcg by mouth daily before breakfast.   Yes Historical Provider, MD  pantoprazole (PROTONIX) 40 MG tablet Take 40 mg by mouth every morning.   Yes Historical Provider, MD   There were no vitals taken for this visit. Physical Exam  Constitutional: She appears well-developed and well-nourished. No distress.  HENT:  Head: Normocephalic.    Eyes: EOM are normal. Pupils are equal, round, and reactive to light.  Cardiovascular:  Normal rate.   Pulmonary/Chest: Effort normal.  Musculoskeletal: She exhibits tenderness.       Hands: Contusion over the right thenar eminence with mild swelling. Pain with palpation of the right first MCP joint no right snuffbox or wrist tenderness. 2+ radial pulse  Nursing note and vitals reviewed.   ED Course  Procedures (including critical care time) Labs Review Labs Reviewed - No data to display  Imaging Review Dg Wrist Complete Right  04/13/2015  CLINICAL DATA:  Fall with wrist pain.  Initial encounter. EXAM: RIGHT WRIST - COMPLETE 3+ VIEW COMPARISON:  None. FINDINGS: No evidence of acute fracture or dislocation. First CMC osteoarthritis with narrowing and mild  spurring with subluxation. Osteopenia. IMPRESSION: No acute finding. Electronically Signed   By: Marnee Spring M.D.   On: 04/13/2015 13:53   I have personally reviewed and evaluated these images and lab results as part of my medical decision-making.   EKG Interpretation None      MDM   Final diagnoses:  Hand sprain, right, initial encounter    Patient with a mechanical fall on Friday initially seen in the emergency room and evaluated with imaging however since that time she had noticed worsening pain in her right thumb and wrist that was not initially x-rayed on her first visit. She came for further evaluation to ensure no injury. She has no tenderness and no significant wrist tenderness but moderate pain over the first MCP joint with ecchymosis and swelling.  Imaging pending  1:57 PM Imaging negative and patient placed in a thumb spica  Gwyneth Sprout, MD 04/13/15 1357  Gwyneth Sprout, MD 04/13/15 1358

## 2015-04-13 NOTE — Discharge Instructions (Signed)
Intermetacarpal Sprain °The intermetacarpal ligaments run between the knuckles at the base of the fingers. These ligaments are vulnerable to sprain and injury in which the ligament becomes overstretched or torn. Intermetacarpal sprains are classified into 3 categories. Grade 1 sprains cause pain, but the tendon is not lengthened. Grade 2 sprains include a lengthened ligament, due to the ligament being stretched or partially ruptured. With grade 2 sprains there is still function, although function may be decreased. Grade 3 sprains include a complete tear of the ligament, and the joint usually displays a loss of function.  °SYMPTOMS  °· Severe pain at the time of injury. °· Often, a feeling of popping or tearing inside the hand. °· Tenderness and inflammation at the knuckles. °· Bruising within a couple days of injury. °· Impaired ability to use the hand. °CAUSES  °This condition occurs when the intermetacarpal ligaments are subjected to a greater stress than they can handle. This causes the ligaments to become stretched or torn. °RISK INCREASES WITH: °· Previous hand injury. °· Fighting sports (boxing, wrestling, martial arts). °· Sports in which you could fall on an outstretched hand (soccer, basketball, volleyball). °· Other sports with repeated hand trauma (water polo, gymnastics). °· Poor hand strength and flexibility. °· Inadequate or poorly fitted protective equipment. °PREVENTION  °· Warm up and stretch properly before activity. °· Maintain appropriate conditioning: °¨ Hand flexibility. °¨ Muscle strength and endurance. °· Applying tape, protective strapping, or a brace may help prevent injury. °· Provide the hand with support during sports and practice activities for 6 to 12 months following injury. °PROGNOSIS  °With proper treatment, healing should occur without impairment. The length of healing varies from 2 to 12 weeks, depending on the severity of injury. °RELATED COMPLICATIONS  °· Longer healing time, if  activities are resumed too soon. °· Recurring symptoms or repeated injury, resulting in a chronic problem. °· Injury to other nearby structures (bone, cartilage, tendon). °· Arthritis of the knuckle (intermetacarpal) joint, with repeated sprains. °· Prolonged disability (sometimes). °· Hand and finger stiffness or weakness. °TREATMENT °Treatment first involves ice and medicine to reduce pain and inflammation. An elastic compression bandage may be worn to reduce discomfort and to protect the area. Depending on the severity of injury, you may be required to restrain the area with a cast, splint, or brace. After the ligament has been allowed to heal, strengthening and stretching exercises may be needed to regain strength and a full range of motion. Exercises may be completed at home or with a therapist. Surgery is rarely needed. °MEDICATION  °· If pain medicine is needed, nonsteroidal anti-inflammatory medicines (aspirin and ibuprofen), or other minor pain relievers (acetaminophen), are often advised. °· Do not take pain medicine for 7 days before surgery. °· Stronger pain relievers may be prescribed if your caregiver thinks they are needed. Use only as directed and only as much as you need. °HEAT AND COLD °· Cold treatment (icing) should be applied for 10 to 15 minutes every 2 to 3 hours for inflammation and pain, and immediately after activity that aggravates your symptoms. Use ice packs or an ice massage. °· Heat treatment may be used before performing stretching and strengthening activities prescribed by your caregiver, physical therapist, or athletic trainer. Use a heat pack or a warm water soak. °SEEK MEDICAL CARE IF:  °· Symptoms remain or get worse, despite treatment for longer than 2 to 4 weeks. °· You experience pain, numbness, discoloration, or coldness in the hand or fingers. °·   You develop blue, gray, or dark fingernails. °· Any of the following occur after surgery: increased pain, swelling, redness,  drainage of fluids, bleeding in the affected area, or signs of infection, including fever. °· New, unexplained symptoms develop. (Drugs used in treatment may produce side effects.) °  °This information is not intended to replace advice given to you by your health care provider. Make sure you discuss any questions you have with your health care provider. °  °Document Released: 06/12/2005 Document Revised: 07/03/2014 Document Reviewed: 11/30/2014 °Elsevier Interactive Patient Education ©2016 [employer] ° °

## 2015-04-13 NOTE — ED Notes (Addendum)
Pt was seen and evaluated 10/14 after fall, right facial bruising and left wrist was evaluated, returns today with increased right wrist pain and raised area of concern on medial right wrist

## 2015-09-20 ENCOUNTER — Other Ambulatory Visit: Payer: Self-pay

## 2015-09-20 DIAGNOSIS — Z1231 Encounter for screening mammogram for malignant neoplasm of breast: Secondary | ICD-10-CM

## 2015-09-21 ENCOUNTER — Other Ambulatory Visit: Payer: Self-pay | Admitting: Family Medicine

## 2015-09-21 DIAGNOSIS — Z0000 Encounter for general adult medical examination without abnormal findings: Secondary | ICD-10-CM

## 2015-09-22 ENCOUNTER — Other Ambulatory Visit: Payer: Self-pay | Admitting: Family Medicine

## 2015-09-22 DIAGNOSIS — E2839 Other primary ovarian failure: Secondary | ICD-10-CM

## 2015-10-05 ENCOUNTER — Ambulatory Visit: Payer: Medicare Other

## 2015-10-13 ENCOUNTER — Ambulatory Visit
Admission: RE | Admit: 2015-10-13 | Discharge: 2015-10-13 | Disposition: A | Discharge status: Home / Self Care | Payer: Medicare Other | Source: Ambulatory Visit

## 2015-10-13 ENCOUNTER — Ambulatory Visit
Admission: RE | Admit: 2015-10-13 | Discharge: 2015-10-13 | Disposition: A | Discharge status: Home / Self Care | Payer: Medicare Other | Source: Ambulatory Visit | Attending: Family Medicine | Admitting: Family Medicine

## 2015-10-13 DIAGNOSIS — Z1231 Encounter for screening mammogram for malignant neoplasm of breast: Secondary | ICD-10-CM

## 2015-10-13 DIAGNOSIS — E2839 Other primary ovarian failure: Secondary | ICD-10-CM

## 2015-10-20 ENCOUNTER — Other Ambulatory Visit: Payer: Self-pay | Admitting: Family Medicine

## 2015-10-20 DIAGNOSIS — R928 Other abnormal and inconclusive findings on diagnostic imaging of breast: Secondary | ICD-10-CM

## 2015-10-25 ENCOUNTER — Ambulatory Visit
Admission: RE | Admit: 2015-10-25 | Discharge: 2015-10-25 | Disposition: A | Discharge status: Home / Self Care | Payer: Medicare Other | Source: Ambulatory Visit | Attending: Family Medicine | Admitting: Family Medicine

## 2015-10-25 DIAGNOSIS — R928 Other abnormal and inconclusive findings on diagnostic imaging of breast: Secondary | ICD-10-CM

## 2015-12-14 ENCOUNTER — Other Ambulatory Visit (HOSPITAL_COMMUNITY): Payer: Self-pay | Admitting: Foot & Ankle Surgery

## 2015-12-14 DIAGNOSIS — R0989 Other specified symptoms and signs involving the circulatory and respiratory systems: Secondary | ICD-10-CM

## 2015-12-21 ENCOUNTER — Ambulatory Visit (HOSPITAL_COMMUNITY)
Admission: RE | Admit: 2015-12-21 | Discharge: 2015-12-21 | Disposition: A | Discharge status: Home / Self Care | Payer: Medicare Other | Source: Ambulatory Visit | Attending: Cardiology | Admitting: Cardiology

## 2015-12-21 DIAGNOSIS — R0989 Other specified symptoms and signs involving the circulatory and respiratory systems: Secondary | ICD-10-CM

## 2015-12-21 DIAGNOSIS — F419 Anxiety disorder, unspecified: Secondary | ICD-10-CM | POA: Diagnosis not present

## 2015-12-21 DIAGNOSIS — K219 Gastro-esophageal reflux disease without esophagitis: Secondary | ICD-10-CM | POA: Diagnosis not present

## 2015-12-21 DIAGNOSIS — I739 Peripheral vascular disease, unspecified: Secondary | ICD-10-CM | POA: Diagnosis not present

## 2016-09-06 ENCOUNTER — Telehealth: Payer: Self-pay | Admitting: *Deleted

## 2016-09-06 NOTE — Telephone Encounter (Signed)
NOTES SENT TO SCHEDULING.  °

## 2016-09-14 ENCOUNTER — Ambulatory Visit: Payer: Medicare Other | Admitting: Physician Assistant

## 2016-12-14 ENCOUNTER — Other Ambulatory Visit: Payer: Self-pay | Admitting: Family Medicine

## 2016-12-14 DIAGNOSIS — Z1231 Encounter for screening mammogram for malignant neoplasm of breast: Secondary | ICD-10-CM

## 2016-12-29 ENCOUNTER — Ambulatory Visit
Admission: RE | Admit: 2016-12-29 | Discharge: 2016-12-29 | Disposition: A | Discharge status: Home / Self Care | Payer: Medicare Other | Source: Ambulatory Visit | Attending: Family Medicine | Admitting: Family Medicine

## 2016-12-29 DIAGNOSIS — Z1231 Encounter for screening mammogram for malignant neoplasm of breast: Secondary | ICD-10-CM

## 2017-10-19 ENCOUNTER — Other Ambulatory Visit: Payer: Self-pay | Admitting: Family Medicine

## 2017-10-19 DIAGNOSIS — M810 Age-related osteoporosis without current pathological fracture: Secondary | ICD-10-CM

## 2017-11-27 ENCOUNTER — Inpatient Hospital Stay
Admission: RE | Admit: 2017-11-27 | Discharge: 2017-11-27 | Disposition: A | Discharge status: Home / Self Care | Payer: Medicare Other | Source: Ambulatory Visit | Attending: Family Medicine | Admitting: Family Medicine

## 2017-12-25 ENCOUNTER — Other Ambulatory Visit: Payer: Self-pay | Admitting: Family Medicine

## 2017-12-25 DIAGNOSIS — Z1231 Encounter for screening mammogram for malignant neoplasm of breast: Secondary | ICD-10-CM

## 2018-02-08 ENCOUNTER — Other Ambulatory Visit: Payer: Medicare Other

## 2018-02-08 ENCOUNTER — Ambulatory Visit: Payer: Medicare Other

## 2018-03-27 ENCOUNTER — Ambulatory Visit
Admission: RE | Admit: 2018-03-27 | Discharge: 2018-03-27 | Disposition: A | Discharge status: Home / Self Care | Payer: Medicare Other | Source: Ambulatory Visit | Attending: Family Medicine | Admitting: Family Medicine

## 2018-03-27 DIAGNOSIS — Z1231 Encounter for screening mammogram for malignant neoplasm of breast: Secondary | ICD-10-CM

## 2018-03-27 DIAGNOSIS — M810 Age-related osteoporosis without current pathological fracture: Secondary | ICD-10-CM

## 2018-09-04 DIAGNOSIS — E039 Hypothyroidism, unspecified: Secondary | ICD-10-CM | POA: Diagnosis not present

## 2018-09-04 DIAGNOSIS — E559 Vitamin D deficiency, unspecified: Secondary | ICD-10-CM | POA: Diagnosis not present

## 2018-09-04 DIAGNOSIS — E7800 Pure hypercholesterolemia, unspecified: Secondary | ICD-10-CM | POA: Diagnosis not present

## 2018-09-04 DIAGNOSIS — M810 Age-related osteoporosis without current pathological fracture: Secondary | ICD-10-CM | POA: Diagnosis not present

## 2018-09-04 DIAGNOSIS — F322 Major depressive disorder, single episode, severe without psychotic features: Secondary | ICD-10-CM | POA: Diagnosis not present

## 2018-09-16 DIAGNOSIS — M791 Myalgia, unspecified site: Secondary | ICD-10-CM | POA: Diagnosis not present

## 2018-09-16 DIAGNOSIS — M461 Sacroiliitis, not elsewhere classified: Secondary | ICD-10-CM | POA: Diagnosis not present

## 2018-09-16 DIAGNOSIS — M47816 Spondylosis without myelopathy or radiculopathy, lumbar region: Secondary | ICD-10-CM | POA: Diagnosis not present

## 2018-10-02 DIAGNOSIS — M47816 Spondylosis without myelopathy or radiculopathy, lumbar region: Secondary | ICD-10-CM | POA: Diagnosis not present

## 2018-12-04 DIAGNOSIS — M47816 Spondylosis without myelopathy or radiculopathy, lumbar region: Secondary | ICD-10-CM | POA: Diagnosis not present

## 2018-12-04 DIAGNOSIS — M461 Sacroiliitis, not elsewhere classified: Secondary | ICD-10-CM | POA: Diagnosis not present

## 2018-12-11 DIAGNOSIS — M461 Sacroiliitis, not elsewhere classified: Secondary | ICD-10-CM | POA: Diagnosis not present

## 2019-04-09 ENCOUNTER — Other Ambulatory Visit: Payer: Self-pay | Admitting: Family Medicine

## 2019-04-09 DIAGNOSIS — Z1231 Encounter for screening mammogram for malignant neoplasm of breast: Secondary | ICD-10-CM

## 2019-04-10 DIAGNOSIS — M810 Age-related osteoporosis without current pathological fracture: Secondary | ICD-10-CM | POA: Diagnosis not present

## 2019-04-10 DIAGNOSIS — F322 Major depressive disorder, single episode, severe without psychotic features: Secondary | ICD-10-CM | POA: Diagnosis not present

## 2019-04-10 DIAGNOSIS — E039 Hypothyroidism, unspecified: Secondary | ICD-10-CM | POA: Diagnosis not present

## 2019-04-10 DIAGNOSIS — Z23 Encounter for immunization: Secondary | ICD-10-CM | POA: Diagnosis not present

## 2019-04-10 DIAGNOSIS — Z0000 Encounter for general adult medical examination without abnormal findings: Secondary | ICD-10-CM | POA: Diagnosis not present

## 2019-04-10 DIAGNOSIS — R413 Other amnesia: Secondary | ICD-10-CM | POA: Diagnosis not present

## 2019-04-10 DIAGNOSIS — E559 Vitamin D deficiency, unspecified: Secondary | ICD-10-CM | POA: Diagnosis not present

## 2019-04-10 DIAGNOSIS — E7800 Pure hypercholesterolemia, unspecified: Secondary | ICD-10-CM | POA: Diagnosis not present

## 2019-04-14 ENCOUNTER — Ambulatory Visit: Payer: Medicare Other

## 2019-04-17 DIAGNOSIS — E538 Deficiency of other specified B group vitamins: Secondary | ICD-10-CM | POA: Diagnosis not present

## 2019-04-21 ENCOUNTER — Ambulatory Visit (INDEPENDENT_AMBULATORY_CARE_PROVIDER_SITE_OTHER): Payer: Medicare HMO | Admitting: Neurology

## 2019-04-21 ENCOUNTER — Other Ambulatory Visit: Payer: Self-pay

## 2019-04-21 ENCOUNTER — Encounter: Payer: Self-pay | Admitting: Neurology

## 2019-04-21 VITALS — BP 117/76 | HR 86 | Temp 96.9°F | Ht 63.0 in | Wt 135.0 lb

## 2019-04-21 DIAGNOSIS — R413 Other amnesia: Secondary | ICD-10-CM

## 2019-04-21 DIAGNOSIS — E079 Disorder of thyroid, unspecified: Secondary | ICD-10-CM | POA: Diagnosis not present

## 2019-04-21 DIAGNOSIS — E538 Deficiency of other specified B group vitamins: Secondary | ICD-10-CM

## 2019-04-21 NOTE — Progress Notes (Signed)
Subjective:    Patient ID: Jessica Whitney is a 73 y.o. female.  HPI     Star Age, MD, PhD [facility] [address] [place], [place] [address]   Dear Loma Sousa, I saw your patient, Jessica Whitney, plan you can request to my neurologic clinic today for initial consultation of her memory loss.  The patient is accompanied by her husband, today.  As you know, Jessica Whitney is a 73 year old right-handed woman with an underlying spinal stenosis, hypothyroidism, reflux disease, diverticulitis, history of arthritis, anxiety, allergic rhinitis, depression, history of fibromyalgia, osteoporosis and osteopenia, status post lumbar spine surgery, history of epidural steroid injections, history of sigmoid colectomy for complicated diverticulitis, vitamin B12 and vitamin D deficiencies in the past, who reports forgetfulness.  Her husband feels that she has had issues with forgetfulness and repeating herself for the past year or 2.  I reviewed your office note from 04/10/2019.  She had blood work in your office including CMP, TSH.  Her TSH was high at 20.35 at the time.  Her BUN was 15, creatinine 1.15.  B12 was low at 147 at the time.  She was advised to start oral vitamin B12 supplementation and come in for B12 injections every other week.  Her husband provides details of her history.  She had her first B12 injection last week.  She had an increase in her levothyroxine from 75 mcg to 100 mcg daily.  The patient reports no family history of dementia.  She had 2 sisters, one passed away, her older sister lives in [place].  She has 1 brother but has not had any contact with him for years.  She quit smoking in the 90s.  Her mother lived to be into her mid 22s and died of cancer, father's medical history is unknown, her parents were divorced.  She has 2 children, her son lives with his family in [place], daughter lives in [place].  She drinks caffeine in the form of coffee,  about 2 cups/day and tea and soda about 2 servings per day, very little water, about 8 to 16 ounces per day by husband's estimation.  She does not exercise in any formal fashion.  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  She has not fallen recently.  Her driving is okay, she has limited her driving, her husband has not noticed any issues with her driving.  Her Past Medical History Is Significant For: Past Medical History:  Diagnosis Date  . Anxiety   . Arthritis   . Diverticulitis   . Fibromyalgia   . GERD (gastroesophageal reflux disease)    bucchini  . Hypothyroidism   . PONV (postoperative nausea and vomiting)   . Spinal stenosis     Her Past Surgical History Is Significant For: Past Surgical History:  Procedure Laterality Date  . APPENDECTOMY    . BACK SURGERY    . bone spur shoulder Bilateral   . BREAST EXCISIONAL BIOPSY    . BREAST SURGERY Right    lumpectomy non cancerous  . CHOLECYSTECTOMY    . COLON SURGERY     foot of colon removed - diverticulitis  . EYE SURGERY Left    cross-eye fixed  . JOINT REPLACEMENT Right    knee  . knee arthroscopic Right   . LUMBAR LAMINECTOMY/DECOMPRESSION MICRODISCECTOMY N/A 04/03/2013   Procedure: Lumbar Four-Five Lumbar diskectomy with Coflex;  Surgeon: Reinaldo Meekerandy O Kritzer, MD;  Location: [facility];  Service: Neurosurgery;  Laterality: N/A;  L4-5 Lumbar diskectomy with Coflex  . TRIGGER FINGER RELEASE Right    x2  . TUBAL LIGATION      Her Family History Is Significant For: History reviewed. No pertinent family history.  Her Social History Is Significant For: Social History   Socioeconomic History  . Marital status: Married    Spouse name: Not on file  . Number of children: Not on file  . Years of education: Not on file  . Highest education level: Not on file  Occupational History  . Not on file  Social Needs  . Financial resource strain: Not on  file  . Food insecurity    Worry: Not on file    Inability: Not on file  . Transportation needs    Medical: Not on file    Non-medical: Not on file  Tobacco Use  . Smoking status: Former Games developermoker  . Smokeless tobacco: Never Used  Substance and Sexual Activity  . Alcohol use: Yes    Comment: social  . Drug use: No  . Sexual activity: Not on file  Lifestyle  . Physical activity    Days per week: Not on file    Minutes per session: Not on file  . Stress: Not on file  Relationships  . Social Musicianconnections    Talks on phone: Not on file    Gets together: Not on file    Attends religious service: Not on file    Active member of club or organization: Not on file    Attends meetings of clubs or organizations: Not on file    Relationship status: Not on file  Other Topics Concern  . Not on file  Social History Narrative  . Not on file    Her Allergies Are:  Allergies  Allergen Reactions  . Sulfa Antibiotics Other (See Comments)    Caused mouth sores  :   Her Current Medications Are:  Outpatient Encounter Medications as of 04/21/2019  Medication Sig  . citalopram (CELEXA) 20 MG tablet Take 20 mg by mouth every morning.  Marland Kitchen. levothyroxine (SYNTHROID, LEVOTHROID) 75 MCG tablet Take 75 mcg by mouth daily before breakfast.  . pantoprazole (PROTONIX) 40 MG tablet Take 40 mg by mouth every morning.  . [DISCONTINUED] DULoxetine HCl 40 MG CPEP Take by mouth 2 (two) times daily.  . [DISCONTINUED] DULoxetine HCl 40 MG CSDR Take by mouth.  . [DISCONTINUED] HYDROcodone-acetaminophen (NORCO/VICODIN) 5-325 MG per tablet Take 1 tablet by mouth 2 (two) times daily as needed for pain.   No facility-administered encounter medications on file as of 04/21/2019.   :   Review of Systems:  Out of a complete 14 point review of systems, all are reviewed and negative with the exception of these symptoms as listed below:  Review of Systems  Neurological:       Pt presents today to discuss her memory.  Pt feels that she is more forgetful.    Objective:  Neurological Exam  Physical Exam Physical Examination:   Vitals:   04/21/19 1317  BP: 117/76  Pulse: 86  Temp: (!) 96.9 F (36.1 C)    General Examination: The patient is a very pleasant 73 y.o. female in no acute distress. She appears well-developed and well-nourished and well groomed. Slightly jovial at times and also downplays her memory issues.   HEENT: Normocephalic, atraumatic, pupils are equal, round and reactive to light and accommodation.  Funduscopic exam is normal with sharp disc margins noted. She is status post bilateral cataract repairs, extraocular tracking is well preserved, hearing is grossly intact.  Face is symmetric with normal facial animation and normal facial sensation.  Speech is clear without dysarthria, hypophonia or voice tremor.  Neck is supple, no carotid bruits.  Airway examination reveals moderate mouth dryness, otherwise benign findings, tongue protrudes centrally in palate elevates symmetrically.  Chest: Clear to auscultation without wheezing, rhonchi or crackles noted.  Heart: S1+S2+0, regular and normal without murmurs, rubs or gallops noted.   Abdomen: Soft, non-tender and non-distended with normal bowel sounds appreciated on auscultation.  Extremities: There is no pitting edema in the distal lower extremities bilaterally. Pedal pulses are intact.  Skin: Warm and dry without trophic changes noted.  Musculoskeletal: exam reveals no obvious joint deformities, tenderness or joint swelling or erythema.   Neurologically:  Mental status: The patient is awake, alert and oriented To self, circumstance, year, season, city, county and state. Her memory is impaired, Her history is primarily provided by her husband. On 04/21/2019: MMSE: 20/30, CDT: 4/4, AFT: 11/min.  Cranial nerves II - XII are as described above under HEENT exam. In addition: shoulder shrug is normal with equal shoulder height  noted. Motor exam: Normal bulk, strength and tone is noted. There is no drift, tremor or rebound. Romberg is negative. Reflexes are 2+ throughout. Babinski: Toes are flexor bilaterally. Fine motor skills and coordination: intact with normal finger taps, normal hand movements, normal rapid alternating patting, normal foot taps and normal foot agility.  Cerebellar testing: No dysmetria or intention tremor on finger to nose testing. Heel to shin is unremarkable bilaterally. There is no truncal or gait ataxia.  Sensory exam: intact to light touch, vibration, temperature sense in the upper and lower extremities.  Gait, station and balance: She stands easily. No veering to one side is noted. No leaning to one side is noted. Posture is age-appropriate and stance is narrow based. Gait shows normal stride length and normal pace. No problems turning are noted.                Assessment and Plan:   In summary, Jessica Whitney is a very pleasant 73 y.o.-year old female with an underlying spinal stenosis, hypothyroidism, reflux disease, diverticulitis, history of arthritis, anxiety, allergic rhinitis, depression, history of fibromyalgia, osteoporosis and osteopenia, status post lumbar spine surgery, history of epidural steroid injections, history of sigmoid colectomy for complicated diverticulitis, vitamin B12 and vitamin D deficiencies in the past, who Presents for evaluation of her memory loss of 1 to 2 years duration. She does not have a strong family history of Alzheimer's disease in her sisters or mother; however, her father's medical history is not known.  Her brother's medical history is also not known.  It is possible that she has Alzheimer's dementia.  She does have some recent medical issues that need further attention.  Thyroid dysfunction or B12 deficiency can be linked to memory dysfunction as well.  She is advised to follow-up with you regarding her thyroid function and for monitoring her B12 level.  She is  advised to increase her water intake as good hydration is also going to be a key factor as well as exercise.  She is advised to start walking for exercise, of course, within her limitation. I would like to proceed with a brain MRI without contrast.  We will keep him posted as to her MRI results by phone.  I would like  to reevaluate her in 3 months and we can consider memory medication down the road such as Aricept.  I answered all their questions today and the patient and her husband were in agreement.   Thank you very much for allowing me to participate in the care of this nice patient. If I can be of any further assistance to you please do not hesitate to call me at [phone].  Sincerely,   Huston Foley, MD, PhD

## 2019-04-21 NOTE — Patient Instructions (Signed)
You have complaints of memory loss: memory loss or changes in cognitive function can have many reasons and does not always mean you have dementia. Conditions that can contribute to subjective or objective memory loss include: depression, stress, poor sleep from insomnia or sleep apnea, dehydration, fluctuation in blood sugar values, thyroid or electrolyte dysfunction and certain vitamin deficiencies. Dementia can be caused by stroke, brain atherosclerosis or brain vascular disease due to vascular risk factors (smoking, high blood pressure, high cholesterol, obesity and uncontrolled diabetes), certain degenerative brain disorders (including Parkinson's disease and Multiple sclerosis) and by Alzheimer's disease or other, more rare and sometimes hereditary causes. We will do some additional testing:   You have had recent blood work (which Showed elevated TSH and below normal vitamin B12, continue to follow-up with your primary care regarding thyroid management and B12 injections.)   We will not start medication as yet. We will monitor your memory.  We will do a brain scan, called MRI and call you with the test results. We will have to schedule you for this on a separate date. This test requires authorization from your insurance, and we will take care of the insurance process.  Please try to hydrate better with water.  6 to 8 cups of water per day are recommended.  Please monitor your driving.  Please try to increase your exercise level by walking on a daily basis, Within, of course, your limitations.

## 2019-04-22 ENCOUNTER — Telehealth: Payer: Self-pay | Admitting: Neurology

## 2019-04-22 NOTE — Telephone Encounter (Signed)
[employer] Auth: [id number] (exp. 04/22/19 to 05/22/19) order sent to GI. They will reach out to the patient to schedule.

## 2019-04-28 ENCOUNTER — Other Ambulatory Visit: Payer: Self-pay

## 2019-04-28 ENCOUNTER — Ambulatory Visit
Admission: RE | Admit: 2019-04-28 | Discharge: 2019-04-28 | Disposition: A | Discharge status: Home / Self Care | Payer: Medicare HMO | Source: Ambulatory Visit | Attending: Family Medicine | Admitting: Family Medicine

## 2019-04-28 DIAGNOSIS — Z1231 Encounter for screening mammogram for malignant neoplasm of breast: Secondary | ICD-10-CM | POA: Diagnosis not present

## 2019-04-30 DIAGNOSIS — E538 Deficiency of other specified B group vitamins: Secondary | ICD-10-CM | POA: Diagnosis not present

## 2019-05-08 ENCOUNTER — Other Ambulatory Visit: Payer: Medicare HMO

## 2019-05-14 DIAGNOSIS — E538 Deficiency of other specified B group vitamins: Secondary | ICD-10-CM | POA: Diagnosis not present

## 2019-05-30 ENCOUNTER — Other Ambulatory Visit: Payer: Self-pay

## 2019-05-30 ENCOUNTER — Ambulatory Visit
Admission: RE | Admit: 2019-05-30 | Discharge: 2019-05-30 | Disposition: A | Discharge status: Home / Self Care | Payer: Medicare HMO | Source: Ambulatory Visit | Attending: Neurology | Admitting: Neurology

## 2019-05-30 DIAGNOSIS — R413 Other amnesia: Secondary | ICD-10-CM

## 2019-06-03 ENCOUNTER — Telehealth: Payer: Self-pay

## 2019-06-03 NOTE — Telephone Encounter (Signed)
I called pt, spoke to pt and her husband Herbie Baltimore, per DPR, regarding pt's MRI results and recommendations. This was an extended conversation. I reminded them of pt's upcoming appt. Pt verbalized understanding of results and recommendations.

## 2019-06-03 NOTE — Progress Notes (Signed)
Please call patient regarding the recent brain MRI: The brain scan showed a normal structure of the brain and mild volume loss which we call atrophy. There were changes in the deeper structures of the brain, which we call white matter changes or microvascular changes. These were reported as moderate in Her case. These are tiny white spots, that occur with time and are seen in a variety of conditions, including with normal aging, chronic hypertension, chronic headaches, especially migraine HAs, chronic diabetes, chronic hyperlipidemia. These are not strokes and no mass or lesion or contrast enhancement was seen which is reassuring. There was a small spot of prior tiny bleed, called microhemorrhage, likely incidental finding and not significant. Nevertheless, good BP control and cholesterol management, managing chronic medical conditions will be key. Again, there were no acute findings, such as a stroke, or mass or blood products. No further action is required on this test at this time, other than re-enforcing the importance of good blood pressure control, good cholesterol control, good blood sugar control, and weight management. Please remind patient to keep any upcoming appointments or tests and to call us with any interim questions, concerns, problems or updates.  FU as planned.  Thanks,  Star Age, MD, PhD

## 2019-06-03 NOTE — Telephone Encounter (Signed)
-----   Message from Star Age, MD sent at 06/03/2019  8:39 AM EST ----- Please call patient regarding the recent brain MRI: The brain scan showed a normal structure of the brain and mild volume loss which we call atrophy. There were changes in the deeper structures of the brain, which we call white matter changes or microvascular changes. These were reported as moderate in Her case. These are tiny white spots, that occur with time and are seen in a variety of conditions, including with normal aging, chronic hypertension, chronic headaches, especially migraine HAs, chronic diabetes, chronic hyperlipidemia. These are not strokes and no mass or lesion or contrast enhancement was seen which is reassuring. There was a small spot of prior tiny bleed, called microhemorrhage, likely incidental finding and not significant. Nevertheless, good BP control and cholesterol management, managing chronic medical conditions will be key. Again, there were no acute findings, such as a stroke, or mass or blood products. No further action is required on this test at this time, other than re-enforcing the importance of good blood pressure control, good cholesterol control, good blood sugar control, and weight management. Please remind patient to keep any upcoming appointments or tests and to call us with any interim questions, concerns, problems or updates.  FU as planned.  Thanks,  Star Age, MD, PhD

## 2019-07-16 DIAGNOSIS — H6980 Other specified disorders of Eustachian tube, unspecified ear: Secondary | ICD-10-CM | POA: Diagnosis not present

## 2019-07-23 DIAGNOSIS — E039 Hypothyroidism, unspecified: Secondary | ICD-10-CM | POA: Diagnosis not present

## 2019-07-23 DIAGNOSIS — M810 Age-related osteoporosis without current pathological fracture: Secondary | ICD-10-CM | POA: Diagnosis not present

## 2019-07-23 DIAGNOSIS — F322 Major depressive disorder, single episode, severe without psychotic features: Secondary | ICD-10-CM | POA: Diagnosis not present

## 2019-07-23 DIAGNOSIS — E7800 Pure hypercholesterolemia, unspecified: Secondary | ICD-10-CM | POA: Diagnosis not present

## 2019-07-23 DIAGNOSIS — M8580 Other specified disorders of bone density and structure, unspecified site: Secondary | ICD-10-CM | POA: Diagnosis not present

## 2019-07-24 ENCOUNTER — Encounter: Payer: Self-pay | Admitting: Neurology

## 2019-07-24 ENCOUNTER — Ambulatory Visit: Payer: Medicare HMO | Admitting: Neurology

## 2019-07-24 ENCOUNTER — Other Ambulatory Visit: Payer: Self-pay

## 2019-07-24 ENCOUNTER — Telehealth: Payer: Self-pay

## 2019-07-24 VITALS — BP 106/66 | HR 108 | Temp 97.5°F | Ht 62.5 in | Wt 133.0 lb

## 2019-07-24 DIAGNOSIS — F0390 Unspecified dementia without behavioral disturbance: Secondary | ICD-10-CM | POA: Diagnosis not present

## 2019-07-24 DIAGNOSIS — S00412A Abrasion of left ear, initial encounter: Secondary | ICD-10-CM | POA: Diagnosis not present

## 2019-07-24 DIAGNOSIS — E079 Disorder of thyroid, unspecified: Secondary | ICD-10-CM

## 2019-07-24 DIAGNOSIS — E538 Deficiency of other specified B group vitamins: Secondary | ICD-10-CM | POA: Diagnosis not present

## 2019-07-24 DIAGNOSIS — R413 Other amnesia: Secondary | ICD-10-CM

## 2019-07-24 MED ORDER — DONEPEZIL HCL 10 MG PO TABS: ORAL_TABLET | ORAL | 3 refills | Status: DC

## 2019-07-24 NOTE — Progress Notes (Signed)
Subjective:    Patient ID: Jessica Whitney is a 74 y.o. female.  HPI     Interim history:   Jessica Whitney is a 74 year old right-handed woman with an underlying spinal stenosis, hypothyroidism, reflux disease, diverticulitis, history of arthritis, anxiety, allergic rhinitis, depression, history of fibromyalgia, osteoporosis and osteopenia, status post lumbar spine surgery, history of epidural steroid injections, history of sigmoid colectomy for complicated diverticulitis, vitamin B12 and vitamin D deficiencies in the past, who presents for follow-up consultation of her memory loss.  The patient is accompanied by her husband today.  I first met her on 04/21/2019 at the request of her primary care PA, at which time she reported a approximately 2-year history of forgetfulness.  Her MMSE was 20 out of 30.  However, she had recent blood work through her primary care office and was noted to have a highly elevated TSH. Her Synthroid was increased and she was also noted to have a low vitamin B12 and was started on B12 injections.  I suggested she follow-up with her primary care PA or physician on a regular basis for these medical issues that can contribute to memory loss.  I also suggested we proceed with a brain MRI.  I talked to them about potentially starting a memory medication such as donepezil. She had a brain MRI without contrast on 05/30/2019 and I reviewed the results:   IMPRESSION: This MRI of the brain without contrast shows the following: 1.   Mild generalized cortical atrophy most noticeable in the insular cortex. 2.   Moderate chronic microvascular ischemic changes.  None of the foci appear to be acute. 3.   Single chronic microhemorrhage in the right temporal lobe.  Since there is just one focus, this is unlikely to be clinically significant. 4.   There are no acute findings.   We called her with her test results.  Today, 07/24/2019: She reports Feeling about the same.  She admits that she does  not drink a whole lot of water, reports that she "hates water". She drinks coffee in the morning and likes to drink soda, about 3 glasses/day on average.  She has not had any falls.  She does not exercise very much.  Her husband reports that she has become more forgetful even since her last visit, she keeps repeating herself and also forgets events such as going to the doctor and having been checked out for a problem that she continues to feel is a problem such as her left ear bothering her.She had a checkup with her primary care PA in November.  I was not able to review the note.  She is not sure if she needed to change her medication, her husband is not fully sure if there was any change in her medication regimen such as the dose of her Synthroid.  She continues to take 100 mcg/day.  The patient's allergies, current medications, family history, past medical history, past social history, past surgical history and problem list were reviewed and updated as appropriate.   Previously:   04/21/2019: (She) reports forgetfulness.  Her husband feels that she has had issues with forgetfulness and repeating herself for the past year or 2.  I reviewed your office note from 04/10/2019.  She had blood work in your office including CMP, TSH.  Her TSH was high at 20.35 at the time.  Her BUN was 15, creatinine 1.15.  B12 was low at 147 at the time.  She was advised to start oral vitamin  B12 supplementation and come in for B12 injections every other week.  Her husband provides details of her history.  She had her first B12 injection last week.  She had an increase in her levothyroxine from 75 mcg to 100 mcg daily.  The patient reports no family history of dementia.  She had 2 sisters, one passed away, her older sister lives in [place].  She has 1 brother but has not had any contact with him for years.  She quit smoking in the 90s.  Her mother lived to be into her mid 47s and died of cancer, father's medical history is  unknown, her parents were divorced.  She has 2 children, her son lives with his family in [place], daughter lives in [place].  She drinks caffeine in the form of coffee, about 2 cups/day and tea and soda about 2 servings per day, very little water, about 8 to 16 ounces per day by husband's estimation.  She does not exercise in any formal fashion.  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  She has not fallen recently.  Her driving is okay, she has limited her driving, her husband has not noticed any issues with her driving.  Her Past Medical History Is Significant For: Past Medical History:  Diagnosis Date  . Anxiety   . Arthritis   . Diverticulitis   . Fibromyalgia   . GERD (gastroesophageal reflux disease)    bucchini  . Hypothyroidism   . PONV (postoperative nausea and vomiting)   . Spinal stenosis     Her Past Surgical History Is Significant For: Past Surgical History:  Procedure Laterality Date  . APPENDECTOMY    . BACK SURGERY    . bone spur shoulder Bilateral   . BREAST EXCISIONAL BIOPSY Right   . BREAST SURGERY Right    lumpectomy non cancerous  . CHOLECYSTECTOMY    . COLON SURGERY     foot of colon removed - diverticulitis  . EYE SURGERY Left    cross-eye fixed  . JOINT REPLACEMENT Right    knee  . knee arthroscopic Right   . LUMBAR LAMINECTOMY/DECOMPRESSION MICRODISCECTOMY N/A 04/03/2013   Procedure: Lumbar Four-Five Lumbar diskectomy with Coflex;  Surgeon: Faythe Ghee, MD;  Location: [facility];  Service: Neurosurgery;  Laterality: N/A;  L4-5 Lumbar diskectomy with Coflex  . TRIGGER FINGER RELEASE Right    x2  . TUBAL LIGATION      Her Family History Is Significant For: No family history on file.  Her Social History Is Significant For: Social History   Socioeconomic History  . Marital status: Married    Spouse name: Not on file  . Number of children: Not on file  . Years  of education: Not on file  . Highest education level: Not on file  Occupational History  . Not on file  Tobacco Use  . Smoking status: Former Research scientist (life sciences)  . Smokeless tobacco: Never Used  Substance and Sexual Activity  . Alcohol use: Yes    Comment: social  . Drug use: No  . Sexual activity: Not on file  Other Topics Concern  . Not on file  Social History Narrative  . Not on file   Social Determinants of Health   Financial Resource Strain:   . Difficulty of Paying Living Expenses: Not on file  Food Insecurity:   . Worried About Charity fundraiser in the Last  Year: Not on file  . Ran Out of Food in the Last Year: Not on file  Transportation Needs:   . Lack of Transportation (Medical): Not on file  . Lack of Transportation (Non-Medical): Not on file  Physical Activity:   . Days of Exercise per Week: Not on file  . Minutes of Exercise per Session: Not on file  Stress:   . Feeling of Stress : Not on file  Social Connections:   . Frequency of Communication with Friends and Family: Not on file  . Frequency of Social Gatherings with Friends and Family: Not on file  . Attends Religious Services: Not on file  . Active Member of Clubs or Organizations: Not on file  . Attends Archivist Meetings: Not on file  . Marital Status: Not on file    Her Allergies Are:  Allergies  Allergen Reactions  . Sulfa Antibiotics Other (See Comments)    Caused mouth sores  :   Her Current Medications Are:  Outpatient Encounter Medications as of 07/24/2019  Medication Sig  . Cyanocobalamin (VITAMIN B 12 PO) Take by mouth.  . DULoxetine HCl 40 MG CPEP 2 tablets per day  . levothyroxine (SYNTHROID, LEVOTHROID) 75 MCG tablet Take 75 mcg by mouth daily before breakfast.  . pantoprazole (PROTONIX) 40 MG tablet Take 40 mg by mouth every morning.  . [DISCONTINUED] citalopram (CELEXA) 20 MG tablet Take 20 mg by mouth every morning.   No facility-administered encounter medications on file as of  07/24/2019.  :  Review of Systems:  Out of a complete 14 point review of systems, all are reviewed and negative with the exception of these symptoms as listed below: Review of Systems  Neurological:       Husband reports decline in her memory.     Objective:  Neurological Exam  Physical Exam Physical Examination:   Vitals:   07/24/19 1350  BP: 106/66  Pulse: (!) 108  Temp: (!) 97.5 F (36.4 C)    General Examination: The patient is a very pleasant 74 y.o. female in no acute distress. She appears well-developed and well-nourished and well groomed.   HEENT: Normocephalic, atraumatic, pupils are equal, round and reactive to light, she is status post bilateral cataract repairs, extraocular tracking is well preserved, hearing is grossly intact.  Face is symmetric with normal facial animation and normal facial sensation.  Speech is clear without dysarthria, hypophonia or voice tremor.  Neck is supple, no carotid bruits.  Airway examination reveals moderate mouth dryness, otherwise benign findings, tongue protrudes centrally in palate elevates symmetrically.  Chest: Clear to auscultation without wheezing, rhonchi or crackles noted.  Heart: S1+S2+0, regular and normal without murmurs, rubs or gallops noted.   Abdomen: Soft, non-tender and non-distended with normal bowel sounds appreciated on auscultation.  Extremities: There is no pitting edema in the distal lower extremities bilaterally. Pedal pulses are intact.  Skin: Warm and dry without trophic changes noted.  Musculoskeletal: exam reveals no obvious joint deformities, tenderness or joint swelling or erythema.   Neurologically:  Mental status: The patient is awake, alert and oriented To self, circumstance, year, season, city, county and state. She does appear to downplay or under report her symptoms.  Her memory is impaired, Her history is primarily provided by her husband.  On 04/21/2019: MMSE: 20/30, CDT: 4/4, AFT:  11/min. On 07/24/2019: MMSE: 20/30, CDT: 4/4, AFT: 10/min.  Cranial nerves II - XII are as described above under HEENT exam. In addition: shoulder shrug is  normal with equal shoulder height noted. Motor exam: Normal bulk, strength and tone is noted. There is no drift, tremor or rebound. Romberg is negative. Reflexes are 2+ throughout. Babinski: Toes are flexor bilaterally. Fine motor skills and coordination: intact with normal finger taps, normal hand movements, normal rapid alternating patting, normal foot taps and normal foot agility.  Cerebellar testing: No dysmetria or intention tremor on finger to nose testing. Heel to shin is unremarkable bilaterally. There is no truncal or gait ataxia.  Sensory exam: intact to light touch, vibration, temperature sense in the upper and lower extremities.  Gait, station and balance: She stands easily. No veering to one side is noted. No leaning to one side is noted. Posture is age-appropriate and stance is narrow based. Gait shows normal stride length and normal pace. No problems turning are noted.                Assessment and Plan:   In summary, Jessica Whitney is a very pleasant 74 year old female with an underlying spinal stenosis, hypothyroidism, reflux disease, diverticulitis, history of arthritis, anxiety, allergic rhinitis, depression, history of fibromyalgia, osteoporosis and osteopenia, status post lumbar spine surgery, history of epidural steroid injections, history of sigmoid colectomy for complicated diverticulitis, vitamin B12 and vitamin D deficiencies in the past, who Presents for Follow-up consultation of her memory loss of approximately 1 to 2 years duration.  She does not have a telltale family history on her mother side developed.  Her father's side of the history is not fully known. She has had thyroid dysfunction and had a recent change in her Synthroid dose.  She has started B12 injections for B12 deficiency.  Both these medical issues can  contribute to memory dysfunction.  Brain MRI from December showed mild atrophy and moderate vascular changes. We talked about the importance of maintaining a healthy lifestyle, including good nutrition, good hydration with water and physical activity.  I would like to proceed with a more in-depth cognitive evaluation with the help of a neuropsychologist.  They are agreeable.  To that end, I made a referral.  In addition, I would like to be more proactive about getting her started on medication, I would like for her to start Aricept generic 10 mg strength half a pill once daily for now, with the intent to increase it after about a month.  She is advised to follow-up with her primary care PA on a regular basis.  She may have had some interim blood work in November.She is advised to follow-up with one of our nurse practitioners in 3 months, sooner if needed.  I answered all their questions today and the patient and her husband were in agreement.

## 2019-07-24 NOTE — Patient Instructions (Signed)
We will request a formal neuropsychological test (aka cognitive testing) for your memory complaints. This requires a referral to a trained and licensed neuropsychologist and will be a separate appointment at a different clinic.   Please continue to follow-up with your primary care PA, Jessica Whitney, about your thyroid disease and B12 injections on a regular basis.  We will continue to monitor your memory.  As discussed, I would like to get you started on a memory medication called Aricept (generic name: donepezil) 10mg : take Half a pill once daily for the first month, then increase to 1 pill once daily, take in the evening each day. Common side effects may include dry eyes, dry mouth, confusion, low pulse, low blood pressure, also GI related side effects (nausea, vomiting, diarrhea, constipation), headaches; rare side effects may include hallucinations and seizures.   Please try to hydrate better with water. Please try to exercise on a regular basis, in the form of walking if possible.

## 2019-07-24 NOTE — Telephone Encounter (Signed)
Jessica Whitney, could you contact Dr Malena Peer office and request labs from 07/14/2019? Thanks!

## 2019-07-28 ENCOUNTER — Telehealth: Payer: Self-pay | Admitting: *Deleted

## 2019-07-28 DIAGNOSIS — F322 Major depressive disorder, single episode, severe without psychotic features: Secondary | ICD-10-CM | POA: Diagnosis not present

## 2019-07-28 DIAGNOSIS — M810 Age-related osteoporosis without current pathological fracture: Secondary | ICD-10-CM | POA: Diagnosis not present

## 2019-07-28 DIAGNOSIS — E039 Hypothyroidism, unspecified: Secondary | ICD-10-CM | POA: Diagnosis not present

## 2019-07-28 DIAGNOSIS — E7800 Pure hypercholesterolemia, unspecified: Secondary | ICD-10-CM | POA: Diagnosis not present

## 2019-07-28 DIAGNOSIS — M8580 Other specified disorders of bone density and structure, unspecified site: Secondary | ICD-10-CM | POA: Diagnosis not present

## 2019-07-28 NOTE — Telephone Encounter (Signed)
Pt husband called, have a question about wife medication. Please call [phone]

## 2019-07-28 NOTE — Telephone Encounter (Signed)
Done

## 2019-07-29 NOTE — Telephone Encounter (Signed)
I reached out to the pt's husband. He wanted to review possible side effects of aricept. I reviewed the possible side effects listed in last visit AVS and he verbalized understanding.  Pt had no further questions at this time.

## 2019-08-05 ENCOUNTER — Encounter: Payer: Self-pay | Admitting: Psychology

## 2019-08-07 DIAGNOSIS — E039 Hypothyroidism, unspecified: Secondary | ICD-10-CM | POA: Diagnosis not present

## 2019-08-10 ENCOUNTER — Ambulatory Visit: Payer: Medicare HMO | Attending: Internal Medicine

## 2019-08-10 DIAGNOSIS — Z23 Encounter for immunization: Secondary | ICD-10-CM

## 2019-08-10 NOTE — Progress Notes (Signed)
   Covid-19 Vaccination Clinic  Name:  Jessica Whitney    MRN: [id number] DOB: 1945-10-02  08/10/2019  [name] was observed post Covid-19 immunization for 15 minutes without incidence. She was provided with Vaccine Information Sheet and instruction to access the V-Safe system.   [name] was instructed to call 911 with any severe reactions post vaccine: Marland Kitchen Difficulty breathing  . Swelling of your face and throat  . A fast heartbeat  . A bad rash all over your body  . Dizziness and weakness    Immunizations Administered    Name Date Dose VIS Date Route   Pfizer COVID-19 Vaccine 08/10/2019 10:54 AM 0.3 mL 06/06/2019 Intramuscular   Manufacturer: [employer], [employer]   Lot: [id number]   NDC: [address]-3

## 2019-08-19 ENCOUNTER — Telehealth: Payer: Self-pay | Admitting: Neurology

## 2019-08-19 NOTE — Telephone Encounter (Signed)
Pt's son called stating that he would like the RN or provider to call his father Jessica Whitney, on [place] so that he can discuss the pt's memory loss that has been getting worse in the last couple of weeks. Please advise.

## 2019-08-19 NOTE — Telephone Encounter (Signed)
I called the pt's husband back. He reports since last visit pt's memory has worsened. He reports the pt will forget at time she is married and memory loss is more pronounced.  Husband wanted to know if it would be a good time to increase the aricept to 1 whole tab of the 10 mg pill.  Husband is concerned that starting the medication is what brought on the increased confusion.  Pt is asking for MD to review and provide her recommendation.

## 2019-08-19 NOTE — Telephone Encounter (Signed)
I reached out to the pt's husband and advised of recommendation. He was agreeable to this plan.

## 2019-08-19 NOTE — Telephone Encounter (Signed)
Please call husband back, we can increase the Aricept to 1 full pill daily, 10 mg strength.  She is scheduled to see Dr. Milbert Coulter for neuropsychological testing and evaluation in March and has a follow-up with Amy in April, we can keep those appointments.

## 2019-08-20 DIAGNOSIS — H6012 Cellulitis of left external ear: Secondary | ICD-10-CM | POA: Diagnosis not present

## 2019-08-29 DIAGNOSIS — R1013 Epigastric pain: Secondary | ICD-10-CM | POA: Diagnosis not present

## 2019-08-29 DIAGNOSIS — R112 Nausea with vomiting, unspecified: Secondary | ICD-10-CM | POA: Diagnosis not present

## 2019-08-30 ENCOUNTER — Other Ambulatory Visit: Payer: Self-pay

## 2019-08-30 ENCOUNTER — Inpatient Hospital Stay (HOSPITAL_BASED_OUTPATIENT_CLINIC_OR_DEPARTMENT_OTHER)
Admission: EM | Admit: 2019-08-30 | Discharge: 2019-09-06 | DRG: 871 | Disposition: A | Discharge status: Home Health | Payer: Medicare HMO | Source: Ambulatory Visit | Attending: Internal Medicine | Admitting: Internal Medicine

## 2019-08-30 ENCOUNTER — Encounter (HOSPITAL_BASED_OUTPATIENT_CLINIC_OR_DEPARTMENT_OTHER): Payer: Self-pay

## 2019-08-30 ENCOUNTER — Emergency Department (HOSPITAL_BASED_OUTPATIENT_CLINIC_OR_DEPARTMENT_OTHER): Payer: Medicare HMO

## 2019-08-30 DIAGNOSIS — D649 Anemia, unspecified: Secondary | ICD-10-CM | POA: Diagnosis present

## 2019-08-30 DIAGNOSIS — I10 Essential (primary) hypertension: Secondary | ICD-10-CM | POA: Diagnosis present

## 2019-08-30 DIAGNOSIS — E860 Dehydration: Secondary | ICD-10-CM | POA: Diagnosis not present

## 2019-08-30 DIAGNOSIS — F329 Major depressive disorder, single episode, unspecified: Secondary | ICD-10-CM

## 2019-08-30 DIAGNOSIS — Z882 Allergy status to sulfonamides status: Secondary | ICD-10-CM | POA: Diagnosis not present

## 2019-08-30 DIAGNOSIS — Z20822 Contact with and (suspected) exposure to covid-19: Secondary | ICD-10-CM | POA: Diagnosis not present

## 2019-08-30 DIAGNOSIS — Z87891 Personal history of nicotine dependence: Secondary | ICD-10-CM

## 2019-08-30 DIAGNOSIS — E876 Hypokalemia: Secondary | ICD-10-CM | POA: Diagnosis not present

## 2019-08-30 DIAGNOSIS — R933 Abnormal findings on diagnostic imaging of other parts of digestive tract: Secondary | ICD-10-CM

## 2019-08-30 DIAGNOSIS — F0390 Unspecified dementia without behavioral disturbance: Secondary | ICD-10-CM | POA: Diagnosis present

## 2019-08-30 DIAGNOSIS — R0789 Other chest pain: Secondary | ICD-10-CM | POA: Diagnosis not present

## 2019-08-30 DIAGNOSIS — K851 Biliary acute pancreatitis without necrosis or infection: Secondary | ICD-10-CM | POA: Diagnosis not present

## 2019-08-30 DIAGNOSIS — R112 Nausea with vomiting, unspecified: Secondary | ICD-10-CM | POA: Diagnosis present

## 2019-08-30 DIAGNOSIS — M1990 Unspecified osteoarthritis, unspecified site: Secondary | ICD-10-CM | POA: Diagnosis present

## 2019-08-30 DIAGNOSIS — F419 Anxiety disorder, unspecified: Secondary | ICD-10-CM

## 2019-08-30 DIAGNOSIS — Z79890 Hormone replacement therapy (postmenopausal): Secondary | ICD-10-CM | POA: Diagnosis not present

## 2019-08-30 DIAGNOSIS — F0150 Vascular dementia without behavioral disturbance: Secondary | ICD-10-CM

## 2019-08-30 DIAGNOSIS — K219 Gastro-esophageal reflux disease without esophagitis: Secondary | ICD-10-CM | POA: Diagnosis not present

## 2019-08-30 DIAGNOSIS — F0391 Unspecified dementia with behavioral disturbance: Secondary | ICD-10-CM

## 2019-08-30 DIAGNOSIS — K859 Acute pancreatitis without necrosis or infection, unspecified: Secondary | ICD-10-CM

## 2019-08-30 DIAGNOSIS — K760 Fatty (change of) liver, not elsewhere classified: Secondary | ICD-10-CM | POA: Diagnosis not present

## 2019-08-30 DIAGNOSIS — F0280 Dementia in other diseases classified elsewhere without behavioral disturbance: Secondary | ICD-10-CM

## 2019-08-30 DIAGNOSIS — G8929 Other chronic pain: Secondary | ICD-10-CM | POA: Diagnosis present

## 2019-08-30 DIAGNOSIS — K8500 Idiopathic acute pancreatitis without necrosis or infection: Secondary | ICD-10-CM | POA: Diagnosis not present

## 2019-08-30 DIAGNOSIS — R935 Abnormal findings on diagnostic imaging of other abdominal regions, including retroperitoneum: Secondary | ICD-10-CM | POA: Diagnosis not present

## 2019-08-30 DIAGNOSIS — A419 Sepsis, unspecified organism: Principal | ICD-10-CM | POA: Diagnosis present

## 2019-08-30 DIAGNOSIS — E039 Hypothyroidism, unspecified: Secondary | ICD-10-CM

## 2019-08-30 DIAGNOSIS — M797 Fibromyalgia: Secondary | ICD-10-CM

## 2019-08-30 DIAGNOSIS — F32A Depression, unspecified: Secondary | ICD-10-CM

## 2019-08-30 DIAGNOSIS — R109 Unspecified abdominal pain: Secondary | ICD-10-CM | POA: Diagnosis not present

## 2019-08-30 DIAGNOSIS — M4800 Spinal stenosis, site unspecified: Secondary | ICD-10-CM | POA: Diagnosis not present

## 2019-08-30 DIAGNOSIS — Z96651 Presence of right artificial knee joint: Secondary | ICD-10-CM | POA: Diagnosis not present

## 2019-08-30 HISTORY — DX: Hypothyroidism, unspecified: E03.9

## 2019-08-30 HISTORY — DX: Acute pancreatitis without necrosis or infection, unspecified: K85.90

## 2019-08-30 HISTORY — DX: Hypokalemia: E87.6

## 2019-08-30 HISTORY — DX: Major depressive disorder, single episode, unspecified: F32.9

## 2019-08-30 LAB — CBC
HCT: 49.5 % — ABNORMAL HIGH (ref 36.0–46.0)
Hemoglobin: 15.9 g/dL — ABNORMAL HIGH (ref 12.0–15.0)
MCH: 29.5 pg (ref 26.0–34.0)
MCHC: 32.1 g/dL (ref 30.0–36.0)
MCV: 91.8 fL (ref 80.0–100.0)
Platelets: 299 10*3/uL (ref 150–400)
RBC: 5.39 MIL/uL — ABNORMAL HIGH (ref 3.87–5.11)
RDW: 14.6 % (ref 11.5–15.5)
WBC: 31.1 10*3/uL — ABNORMAL HIGH (ref 4.0–10.5)
nRBC: 0 % (ref 0.0–0.2)

## 2019-08-30 LAB — COMPREHENSIVE METABOLIC PANEL
ALT: 35 U/L (ref 0–44)
AST: 44 U/L — ABNORMAL HIGH (ref 15–41)
Albumin: 4 g/dL (ref 3.5–5.0)
Alkaline Phosphatase: 76 U/L (ref 38–126)
Anion gap: 14 (ref 5–15)
BUN: 23 mg/dL (ref 8–23)
CO2: 21 mmol/L — ABNORMAL LOW (ref 22–32)
Calcium: 9.3 mg/dL (ref 8.9–10.3)
Chloride: 100 mmol/L (ref 98–111)
Creatinine, Ser: 1.06 mg/dL — ABNORMAL HIGH (ref 0.44–1.00)
GFR calc Af Amer: >60 mL/min (ref >60)
GFR calc non Af Amer: 52 mL/min — ABNORMAL LOW (ref >60)
Glucose, Bld: 133 mg/dL — ABNORMAL HIGH (ref 70–99)
Potassium: 3.2 mmol/L — ABNORMAL LOW (ref 3.5–5.1)
Sodium: 135 mmol/L (ref 135–145)
Total Bilirubin: 1.5 mg/dL — ABNORMAL HIGH (ref 0.3–1.2)
Total Protein: 7.2 g/dL (ref 6.5–8.1)

## 2019-08-30 LAB — CREATININE, SERUM
Creatinine, Ser: 0.98 mg/dL (ref 0.44–1.00)
GFR calc Af Amer: >60 mL/min (ref >60)
GFR calc non Af Amer: 57 mL/min — ABNORMAL LOW (ref >60)

## 2019-08-30 LAB — CBC WITH DIFFERENTIAL/PLATELET
Abs Immature Granulocytes: 0.12 10*3/uL — ABNORMAL HIGH (ref 0.00–0.07)
Basophils Absolute: 0 10*3/uL (ref 0.0–0.1)
Basophils Relative: 0 %
Eosinophils Absolute: 0 10*3/uL (ref 0.0–0.5)
Eosinophils Relative: 0 %
HCT: 47.7 % — ABNORMAL HIGH (ref 36.0–46.0)
Hemoglobin: 15.8 g/dL — ABNORMAL HIGH (ref 12.0–15.0)
Immature Granulocytes: 1 %
Lymphocytes Relative: 6 %
Lymphs Abs: 1 10*3/uL (ref 0.7–4.0)
MCH: 29.2 pg (ref 26.0–34.0)
MCHC: 33.1 g/dL (ref 30.0–36.0)
MCV: 88 fL (ref 80.0–100.0)
Monocytes Absolute: 1.4 10*3/uL — ABNORMAL HIGH (ref 0.1–1.0)
Monocytes Relative: 8 %
Neutro Abs: 15.7 10*3/uL — ABNORMAL HIGH (ref 1.7–7.7)
Neutrophils Relative %: 85 %
Platelets: 393 10*3/uL (ref 150–400)
RBC: 5.42 MIL/uL — ABNORMAL HIGH (ref 3.87–5.11)
RDW: 14.3 % (ref 11.5–15.5)
WBC: 18.2 10*3/uL — ABNORMAL HIGH (ref 4.0–10.5)
nRBC: 0 % (ref 0.0–0.2)

## 2019-08-30 LAB — LIPASE, BLOOD: Lipase: 1512 U/L — ABNORMAL HIGH (ref 11–51)

## 2019-08-30 LAB — RESPIRATORY PANEL BY RT PCR (FLU A&B, COVID)
Influenza A by PCR: NEGATIVE
Influenza B by PCR: NEGATIVE
SARS Coronavirus 2 by RT PCR: NEGATIVE

## 2019-08-30 LAB — TROPONIN I (HIGH SENSITIVITY)
Troponin I (High Sensitivity): 6 ng/L (ref <18)
Troponin I (High Sensitivity): 11 ng/L (ref <18)

## 2019-08-30 MED ORDER — MORPHINE SULFATE (PF) 4 MG/ML IV SOLN
4.0000 mg | Freq: Once | INTRAVENOUS | Status: AC
  Administered 2019-08-30: 4 mg via INTRAVENOUS
  Filled 2019-08-30: qty 1

## 2019-08-30 MED ORDER — LEVOTHYROXINE SODIUM 100 MCG PO TABS
100.0000 ug | ORAL_TABLET | Freq: Every day | ORAL | Status: DC
  Administered 2019-08-31 – 2019-09-06 (×7): 100 ug via ORAL
  Filled 2019-08-30 (×7): qty 1

## 2019-08-30 MED ORDER — SODIUM CHLORIDE 0.9 % IV BOLUS
1000.0000 mL | Freq: Once | INTRAVENOUS | Status: AC
  Administered 2019-08-30: 1000 mL via INTRAVENOUS

## 2019-08-30 MED ORDER — ACETAMINOPHEN 650 MG RE SUPP: 650.0000 mg | Freq: Four times a day (QID) | RECTAL | Status: DC | PRN

## 2019-08-30 MED ORDER — ONDANSETRON HCL 4 MG/2ML IJ SOLN
4.0000 mg | Freq: Four times a day (QID) | INTRAMUSCULAR | Status: DC | PRN
  Administered 2019-08-31 – 2019-09-02 (×4): 4 mg via INTRAVENOUS
  Filled 2019-08-30 (×4): qty 2

## 2019-08-30 MED ORDER — ENOXAPARIN SODIUM 40 MG/0.4ML ~~LOC~~ SOLN
40.0000 mg | SUBCUTANEOUS | Status: DC
  Administered 2019-08-30 – 2019-09-05 (×7): 40 mg via SUBCUTANEOUS
  Filled 2019-08-30 (×7): qty 0.4

## 2019-08-30 MED ORDER — MORPHINE SULFATE (PF) 2 MG/ML IV SOLN
2.0000 mg | INTRAVENOUS | Status: DC | PRN
  Administered 2019-08-30 – 2019-09-04 (×9): 2 mg via INTRAVENOUS
  Filled 2019-08-30 (×9): qty 1

## 2019-08-30 MED ORDER — ONDANSETRON HCL 4 MG PO TABS
4.0000 mg | ORAL_TABLET | Freq: Four times a day (QID) | ORAL | Status: DC | PRN
  Administered 2019-09-02: 4 mg via ORAL
  Filled 2019-08-30: qty 1

## 2019-08-30 MED ORDER — PANTOPRAZOLE SODIUM 40 MG IV SOLR
40.0000 mg | INTRAVENOUS | Status: DC
  Administered 2019-08-30 – 2019-09-05 (×7): 40 mg via INTRAVENOUS
  Filled 2019-08-30 (×7): qty 40

## 2019-08-30 MED ORDER — DONEPEZIL HCL 5 MG PO TABS
5.0000 mg | ORAL_TABLET | Freq: Every day | ORAL | Status: DC
  Administered 2019-08-30 – 2019-09-03 (×5): 5 mg via ORAL
  Filled 2019-08-30 (×5): qty 1

## 2019-08-30 MED ORDER — DULOXETINE HCL 20 MG PO CPEP
80.0000 mg | ORAL_CAPSULE | Freq: Every day | ORAL | Status: DC
  Administered 2019-08-30 – 2019-09-06 (×8): 80 mg via ORAL
  Filled 2019-08-30 (×8): qty 4

## 2019-08-30 MED ORDER — DEXTROSE IN LACTATED RINGERS 5 % IV SOLN: INTRAVENOUS | Status: DC

## 2019-08-30 MED ORDER — ONDANSETRON HCL 4 MG/2ML IJ SOLN
4.0000 mg | Freq: Once | INTRAMUSCULAR | Status: AC
  Administered 2019-08-30: 4 mg via INTRAVENOUS
  Filled 2019-08-30: qty 2

## 2019-08-30 MED ORDER — ACETAMINOPHEN 325 MG PO TABS
650.0000 mg | ORAL_TABLET | Freq: Four times a day (QID) | ORAL | Status: DC | PRN
  Administered 2019-09-01 – 2019-09-06 (×4): 650 mg via ORAL
  Filled 2019-08-30 (×4): qty 2

## 2019-08-30 MED ORDER — IOHEXOL 300 MG/ML  SOLN
100.0000 mL | Freq: Once | INTRAMUSCULAR | Status: AC | PRN
  Administered 2019-08-30: 100 mL via INTRAVENOUS

## 2019-08-30 MED ORDER — ONDANSETRON HCL 4 MG/2ML IJ SOLN
4.0000 mg | Freq: Once | INTRAMUSCULAR | Status: AC
  Administered 2019-08-30: 4 mg via INTRAVENOUS

## 2019-08-30 MED ORDER — POTASSIUM CHLORIDE 10 MEQ/100ML IV SOLN
10.0000 meq | INTRAVENOUS | Status: AC
  Administered 2019-08-30 (×4): 10 meq via INTRAVENOUS
  Filled 2019-08-30 (×4): qty 100

## 2019-08-30 MED ORDER — ONDANSETRON HCL 4 MG/2ML IJ SOLN
INTRAMUSCULAR | Status: AC
  Filled 2019-08-30: qty 2

## 2019-08-30 NOTE — ED Notes (Signed)
Pt placed on 2L [place], when resting O2 around 90% RA.

## 2019-08-30 NOTE — Progress Notes (Signed)
Have alerted Dr.Arrien that pt has arrived to Room 1516.

## 2019-08-30 NOTE — Progress Notes (Signed)
74 year old female who presented with epigastric abdominal pain for about 24 hours, associated with nausea and vomiting.  Past surgical history: Cholecystectomy.   Clinically she is hemodynamically stable blood pressure 157/98, heart rate 95, respiratory rate 20, oxygen saturation 91%.  Lipase 1,512, AST 44, ALT 35, total bilirubin 1.5  CT of the abdomen with extensive inflammatory changes around the pancreas consistent with acute pancreatitis.  Surgery consulted over the phone, no radiographic signs of perforated duodenal ulcer.   Patient will be transferred to [facility] for further management of acute pancreatitis.

## 2019-08-30 NOTE — ED Notes (Signed)
O2 dc'd at this time 

## 2019-08-30 NOTE — H&P (Signed)
History and Physical    PAYSON CRUMBY LPF:[id number] DOB: May 16, 1946 DOA: 08/30/2019  PCP: Jarrett Soho, PA-C   Patient coming from: [facility]  Chief Complaint:  Abdominal pain.   HPI: Jessica Whitney is a 74 y.o. female with medical history significant of hypothyroidism, GERD, anxiety, fibromyalgia and dementia. Patient reported 24 hours of epigastric abdominal pain, dull in nature, moderate to severe in intensity, no worsening or improving factors, associated with nausea and vomiting.  She recalls having similar abdominal pain when she was diagnosed with gallstones.  Post cholecystectomy.  History is limited due to cognitive impairment, chart review shows that she follows regularly with neurology for her dementia.  Recent brain MRI showed generalized cortical atrophy and started on donepezil.    ED Course: Patient was found to be in pain, blood pressure 152/109, positive epigastric abdominal pain, lipase was very elevated, CT of the abdomen showed acute pancreatitis.  Patient received IV fluids, IV analgesics and was transferred to [facility] long for further medical care.  Review of Systems: (limited due to cognitive impairment) 1. General: No fevers, no chills, no weight gain or weight loss 2. ENT: No runny nose or sore throat, no hearing disturbances 3. Pulmonary: No dyspnea, cough, wheezing, or hemoptysis 4. Cardiovascular: No angina, claudication, lower extremity edema, pnd or orthopnea 5. Gastrointestinal: positive  nausea and vomiting, no diarrhea or constipation 6. Hematology: No easy bruisability or frequent infections 7. Urology: No dysuria, hematuria or increased urinary frequency 8. Dermatology: No rashes. 9. Neurology: No seizures or paresthesias 10. Musculoskeletal: No joint pain or deformities  Past Medical History:  Diagnosis Date  . Anxiety   . Arthritis   . Diverticulitis   . Fibromyalgia   . GERD (gastroesophageal reflux disease)    bucchini  . Hypothyroidism    . PONV (postoperative nausea and vomiting)   . Spinal stenosis     Past Surgical History:  Procedure Laterality Date  . APPENDECTOMY    . BACK SURGERY    . bone spur shoulder Bilateral   . BREAST EXCISIONAL BIOPSY Right   . BREAST SURGERY Right    lumpectomy non cancerous  . CHOLECYSTECTOMY    . COLON SURGERY     foot of colon removed - diverticulitis  . EYE SURGERY Left    cross-eye fixed  . JOINT REPLACEMENT Right    knee  . knee arthroscopic Right   . LUMBAR LAMINECTOMY/DECOMPRESSION MICRODISCECTOMY N/A 04/03/2013   Procedure: Lumbar Four-Five Lumbar diskectomy with Coflex;  Surgeon: Reinaldo Meeker, MD;  Location: [facility];  Service: Neurosurgery;  Laterality: N/A;  L4-5 Lumbar diskectomy with Coflex  . TRIGGER FINGER RELEASE Right    x2  . TUBAL LIGATION       reports that she has quit smoking. She has never used smokeless tobacco. She reports current alcohol use. She reports that she does not use drugs.  Allergies  Allergen Reactions  . Sulfa Antibiotics Other (See Comments)    Caused mouth sores    No family history on file.   Prior to Admission medications   Medication Sig Start Date End Date Taking? Authorizing Provider  Cyanocobalamin (VITAMIN B 12 PO) Take by mouth.    [provider]  donepezil (ARICEPT) 10 MG tablet Half pill once daily in the evening for 1 month, then increase to 1 pill each evening thereafter 07/24/19   Huston Foley, MD  DULoxetine HCl 40 MG CPEP 2 tablets per day 05/19/19   [provider]  levothyroxine (SYNTHROID) 100 MCG tablet Take 100 mcg by mouth daily before breakfast.    [provider]  pantoprazole (PROTONIX) 40 MG tablet Take 40 mg by mouth every morning.    [provider]    Physical Exam: Vitals:   08/30/19 1030 08/30/19 1100 08/30/19 1200 08/30/19 1400  BP: (!) 165/110 (!) 157/98 (!) 149/71 (!) 160/96  Pulse: 93 96 93 91  Resp: (!) 23 20 17 17   Temp:      TempSrc:      SpO2:  91% 94% 97% 97%  Weight:      Height:        Vitals:   08/30/19 1030 08/30/19 1100 08/30/19 1200 08/30/19 1400  BP: (!) 165/110 (!) 157/98 (!) 149/71 (!) 160/96  Pulse: 93 96 93 91  Resp: (!) 23 20 17 17   Temp:      TempSrc:      SpO2: 91% 94% 97% 97%  Weight:      Height:       General: deconditioned and ill looking appearing.  Neurology: Awake and alert, non focal Head and Neck. Head normocephalic. Neck supple with no adenopathy or thyromegaly.   E ENT: mild pallor, no icterus, oral mucosa very dry. Cardiovascular: No JVD. S1-S2 present, rhythmic, no gallops, rubs, or murmurs. No lower extremity edema. Pulmonary: positive breath sounds bilaterally, adequate air movement, no wheezing, rhonchi or rales. Gastrointestinal. Abdomen with no organomegaly, tender to palpation at the epigastrium, with positive guarding and rebound. Skin. No rashes Musculoskeletal: no joint deformities    Labs on Admission: I have personally reviewed following labs and imaging studies  CBC: Recent Labs  Lab 08/30/19 0814  WBC 18.2*  NEUTROABS 15.7*  HGB 15.8*  HCT 47.7*  MCV 88.0  PLT 393   Basic Metabolic Panel: Recent Labs  Lab 08/30/19 0814  NA 135  K 3.2*  CL 100  CO2 21*  GLUCOSE 133*  BUN 23  CREATININE 1.06*  CALCIUM 9.3   GFR: Estimated Creatinine Clearance: 39.1 mL/min (A) (by C-G formula based on SCr of 1.06 mg/dL (H)). Liver Function Tests: Recent Labs  Lab 08/30/19 0814  AST 44*  ALT 35  ALKPHOS 76  BILITOT 1.5*  PROT 7.2  ALBUMIN 4.0   Recent Labs  Lab 08/30/19 0814  LIPASE 1,512*   No results for input(s): AMMONIA in the last 168 hours. Coagulation Profile: No results for input(s): INR, PROTIME in the last 168 hours. Cardiac Enzymes: No results for input(s): CKTOTAL, CKMB, CKMBINDEX, TROPONINI in the last 168 hours. BNP (last 3 results) No results for input(s): PROBNP in the last 8760 hours. HbA1C: No results for input(s): HGBA1C in the last 72  hours. CBG: No results for input(s): GLUCAP in the last 168 hours. Lipid Profile: No results for input(s): CHOL, HDL, LDLCALC, TRIG, CHOLHDL, LDLDIRECT in the last 72 hours. Thyroid Function Tests: No results for input(s): TSH, T4TOTAL, FREET4, T3FREE, THYROIDAB in the last 72 hours. Anemia Panel: No results for input(s): VITAMINB12, FOLATE, FERRITIN, TIBC, IRON, RETICCTPCT in the last 72 hours. Urine analysis:    Component Value Date/Time   COLORURINE YELLOW 09/14/2014 2105   APPEARANCEUR CLEAR 09/14/2014 2105   LABSPEC 1.018 09/14/2014 2105   PHURINE 5.5 09/14/2014 2105   GLUCOSEU NEGATIVE 09/14/2014 2105   HGBUR SMALL (A) 09/14/2014 2105   BILIRUBINUR NEGATIVE 09/14/2014 2105   KETONESUR 15 (A) 09/14/2014 2105   PROTEINUR NEGATIVE 09/14/2014 2105   UROBILINOGEN 0.2 09/14/2014 2105   NITRITE NEGATIVE  09/14/2014 2105   LEUKOCYTESUR SMALL (A) 09/14/2014 2105    Radiological Exams on Admission: CT ABDOMEN PELVIS W CONTRAST  Result Date: 08/30/2019 CLINICAL DATA:  74 year old female with history of acute nonlocalized abdominal pain. EXAM: CT ABDOMEN AND PELVIS WITH CONTRAST TECHNIQUE: Multidetector CT imaging of the abdomen and pelvis was performed using the standard protocol following bolus administration of intravenous contrast. CONTRAST:  169mL OMNIPAQUE IOHEXOL 300 MG/ML  SOLN COMPARISON:  CT the abdomen and pelvis 09/15/2014. FINDINGS: Lower chest: Aortic atherosclerosis. Hepatobiliary: Diffuse low attenuation throughout the hepatic parenchyma, indicative of hepatic steatosis. No discrete cystic or solid hepatic lesions. No intra or extrahepatic biliary ductal dilatation. Status post cholecystectomy. Pancreas: Heterogeneous perfusion throughout the pancreas and diffuse surrounding peripancreatic inflammatory changes and small amount of peripancreatic fluid, compatible with acute pancreatitis. No well-defined peripancreatic fluid collections are identified at this time to suggest  pancreatic pseudocyst. Spleen: Unremarkable. Adrenals/Urinary Tract: Subcentimeter low-attenuation lesions in both kidneys, too small to characterize, but statistically likely to represent tiny cysts. Bilateral adrenal glands are normal in appearance. No hydroureteronephrosis. Urinary bladder is normal in appearance. Stomach/Bowel: Normal appearance of the stomach. There are several areas of potential discontinuity in the wall of the duodenum involving second and third portions of the duodenum. This could be artifactual related to hypoperfusion in these areas of the wall from adjacent inflammation associated with the groove pancreatitis or could be related to duodenal ulcer disease with or without perforation. Surrounding fluid is noted in the retroperitoneum. No pathologic dilatation of small bowel or colon. Numerous colonic diverticulae are noted, without definite surrounding inflammatory changes to clearly indicate an acute diverticulitis at this time. Vascular/Lymphatic: Aortic atherosclerosis, without evidence of aneurysm or dissection in the abdominal or pelvic vasculature. No lymphadenopathy noted in the abdomen or pelvis. Reproductive: 2 cm lesion in the anterior aspect of the uterine body, presumably a small fibroid. Ovaries are a trophic. Other: Fluid throughout the retroperitoneum and small bowel mesentery. Trace volume of ascites. No pneumoperitoneum. Musculoskeletal: There are no aggressive appearing lytic or blastic lesions noted in the visualized portions of the skeleton. Posterior fixation hardware at the L4-L5 level. IMPRESSION: 1. Extensive inflammatory changes around the pancreas, compatible with acute pancreatitis. This is most evident in the head of the pancreas, and there are potential areas of discontinuity in the wall of the duodenum involving the second and third portions of the duodenum. This may all simply be related to severe groove pancreatitis, however, further evaluation with endoscopy  should be considered to exclude the possibility of perforated duodenal ulcer(s). 2. Hepatic steatosis. 3. Aortic atherosclerosis. 4. Additional incidental findings, as above. Critical Value/emergent results were called by telephone at the time of interpretation on 08/30/2019 at 10:36 am to provider [facility] , who verbally acknowledged these results. Electronically Signed   By: Vinnie Langton M.D.   On: 08/30/2019 10:36    EKG: Independently reviewed. HR 90 bpm, normal axis, normal intervals, sinus rhythm, no ST segment or T wave changes.   Assessment/Plan Principal Problem:   Acute pancreatitis Active Problems:   Dementia (HCC)   Hypothyroid   GERD (gastroesophageal reflux disease)   Fibromyalgia   Anxiety   Hypokalemia   74 year old female with cognitive impairment who presents with epigastric abdominal pain for about 24 hours, associated with nausea and vomiting.  Her blood pressure is 168/90, heart rate 94, respiratory rate 21, oxygen saturation 97% on room air, she had dry mucous membranes, her lungs are clear to auscultation bilaterally heart S1-S2 present  and rhythmic, abdomen is tender to palpation in the epigastric region, positive rebound and girading, no lower extremity edema.  Sodium 135, potassium 3.2, chloride 100, bicarb 21, glucose 133, BUN 23, creatinine 1.0, lipase 1512, AST 44, ALT 35, total bilirubin is 1.5, white count 18.2, hemoglobin 15.8, hematocrit 47.7, platelets 393.  SARS COVID-19 was negative.  CT of the abdomen with extensive changes around the pancreas, consistent with acute pancreatitis.  No intra or extrahepatic biliary duct dilatation, status post cholecystectomy.  Patient was admitted to the hospital with a working diagnosis of acute pancreatitis complicated by dehydration and hypokalemia.   1.  Acute pancreatitis.  Patient will be admitted to the medical ward, continue supportive medical therapy with intravenous fluids, IV antiacids, as needed  analgesics and antiemetics.  Will prescribe a clear liquid diet and advance as tolerated.  Patient is status post cholecystectomy.  No signs of failure due to dilatation per CT scan.  2.  Dehydration with hypokalemia.  Will continue intravenous fluids with dextrose and balance electrolyte solutions, D5LR at 75 ml per H.  Continue potassium repletion with potassium chloride, 60 meq IV.  Follow-up electrolytes and kidney function in the morning.  3.  Hypothyroidism.  Continue levothyroxine  4.  Cognitive impairment/dementia, continue donepezil and duloxetine   DVT prophylaxis:  Enoxaparin  Code Status:  full  Family Communication: no family at the bedside   Disposition Plan:  Med surg   Consults called:  None   Admission status:  Inpatient.    Deionna Marcantonio Annett Gula MD [facility]   08/30/2019, 3:55 PM

## 2019-08-30 NOTE — Progress Notes (Signed)
Pt has arrived to Room 1516 from [facility]. Alert and oriented.

## 2019-08-30 NOTE — ED Notes (Signed)
Patient transported to CT 

## 2019-08-30 NOTE — ED Triage Notes (Signed)
PT arrives with husband who reports pt has been vomiting since eating around 4 pm yesterday. Pt was seen by Deboraha Sprang [address] per husband pt had blood work and was given prescription for Zofran but the pharmacy had already closed. Husband reports pt was sweating this morning "and talking out of her head".

## 2019-08-30 NOTE — ED Notes (Signed)
ED Provider at bedside. 

## 2019-08-30 NOTE — ED Provider Notes (Signed)
[facility] [facility] EMERGENCY DEPARTMENT Provider Note   CSN: [id number] Arrival date & time: 08/30/19  0750     History Chief Complaint  Patient presents with  . Abdominal Pain    Jessica Whitney is a 74 y.o. female.  Pt presents to the ED today with vomiting since around 1600.  Pt ate some chicken salad from [employer] yesterday around 1600.  Husband did not eat it and has no sx.  Shortly afterwards, she started vomiting and vomited multiple times.  Pt's husband took her to UC and she was given a rx for Zofran.  They arrived to the pharmacy 2 min too late to pick up the rx.  Pt has vomited all night long.  She was "talking out of her head" this am.  No fevers.  She c/o abdominal and chest pain.  CP started after vomiting all night long.        Past Medical History:  Diagnosis Date  . Anxiety   . Arthritis   . Diverticulitis   . Fibromyalgia   . GERD (gastroesophageal reflux disease)    bucchini  . Hypothyroidism   . PONV (postoperative nausea and vomiting)   . Spinal stenosis     There are no problems to display for this patient.   Past Surgical History:  Procedure Laterality Date  . APPENDECTOMY    . BACK SURGERY    . bone spur shoulder Bilateral   . BREAST EXCISIONAL BIOPSY Right   . BREAST SURGERY Right    lumpectomy non cancerous  . CHOLECYSTECTOMY    . COLON SURGERY     foot of colon removed - diverticulitis  . EYE SURGERY Left    cross-eye fixed  . JOINT REPLACEMENT Right    knee  . knee arthroscopic Right   . LUMBAR LAMINECTOMY/DECOMPRESSION MICRODISCECTOMY N/A 04/03/2013   Procedure: Lumbar Four-Five Lumbar diskectomy with Coflex;  Surgeon: Faythe Ghee, MD;  Location: [facility];  Service: Neurosurgery;  Laterality: N/A;  L4-5 Lumbar diskectomy with Coflex  . TRIGGER FINGER RELEASE Right    x2  . TUBAL LIGATION       OB History   No obstetric history on file.     No family history on file.  Social History   Tobacco Use  . Smoking  status: Former Research scientist (life sciences)  . Smokeless tobacco: Never Used  Substance Use Topics  . Alcohol use: Yes    Comment: social  . Drug use: No    Home Medications Prior to Admission medications   Medication Sig Start Date End Date Taking? Authorizing Provider  Cyanocobalamin (VITAMIN B 12 PO) Take by mouth.    [provider]  donepezil (ARICEPT) 10 MG tablet Half pill once daily in the evening for 1 month, then increase to 1 pill each evening thereafter 07/24/19   Star Age, MD  DULoxetine HCl 40 MG CPEP 2 tablets per day 05/19/19   [provider]  levothyroxine (SYNTHROID) 100 MCG tablet Take 100 mcg by mouth daily before breakfast.    [provider]  pantoprazole (PROTONIX) 40 MG tablet Take 40 mg by mouth every morning.    [provider]    Allergies    Sulfa antibiotics  Review of Systems   Review of Systems  Cardiovascular: Positive for chest pain.  Gastrointestinal: Positive for abdominal pain, nausea and vomiting.  All other systems reviewed and are negative.   Physical Exam Updated Vital Signs BP (!) 165/110   Pulse 93  Temp (!) 97.4 F (36.3 C) (Oral)   Resp (!) 23   Ht 5\' 3"  (1.6 m)   Wt 58.5 kg   SpO2 91%   BMI 22.85 kg/m   Physical Exam Vitals and nursing note reviewed.  Constitutional:      Appearance: She is well-developed. She is ill-appearing.  HENT:     Head: Normocephalic and atraumatic.     Mouth/Throat:     Mouth: Mucous membranes are dry.  Eyes:     Extraocular Movements: Extraocular movements intact.     Pupils: Pupils are equal, round, and reactive to light.  Cardiovascular:     Rate and Rhythm: Normal rate and regular rhythm.     Heart sounds: Normal heart sounds.  Pulmonary:     Effort: Pulmonary effort is normal.     Breath sounds: Normal breath sounds.  Abdominal:     General: Abdomen is flat.     Palpations: Abdomen is soft.     Tenderness: There is abdominal tenderness in the epigastric area.   Skin:    General: Skin is warm.     Capillary Refill: Capillary refill takes less than 2 seconds.  Neurological:     General: No focal deficit present.     Mental Status: She is alert and oriented to person, place, and time.  Psychiatric:        Mood and Affect: Mood normal.        Behavior: Behavior normal.     ED Results / Procedures / Treatments   Labs (all labs ordered are listed, but only abnormal results are displayed) Labs Reviewed  CBC WITH DIFFERENTIAL/PLATELET - Abnormal; Notable for the following components:      Result Value   WBC 18.2 (*)    RBC 5.42 (*)    Hemoglobin 15.8 (*)    HCT 47.7 (*)    Neutro Abs 15.7 (*)    Monocytes Absolute 1.4 (*)    Abs Immature Granulocytes 0.12 (*)    All other components within normal limits  COMPREHENSIVE METABOLIC PANEL - Abnormal; Notable for the following components:   Potassium 3.2 (*)    CO2 21 (*)    Glucose, Bld 133 (*)    Creatinine, Ser 1.06 (*)    AST 44 (*)    Total Bilirubin 1.5 (*)    GFR calc non Af Amer 52 (*)    All other components within normal limits  LIPASE, BLOOD - Abnormal; Notable for the following components:   Lipase 1,512 (*)    All other components within normal limits  RESPIRATORY PANEL BY RT PCR (FLU A&B, COVID)  URINALYSIS, ROUTINE W REFLEX MICROSCOPIC  TROPONIN I (HIGH SENSITIVITY)  TROPONIN I (HIGH SENSITIVITY)    EKG EKG Interpretation  Date/Time:  Saturday August 30 2019 08:08:13 EST Ventricular Rate:  90 PR Interval:    QRS Duration: 87 QT Interval:  338 QTC Calculation: 414 R Axis:   65 Text Interpretation: Sinus rhythm Baseline wander in lead(s) V6 Confirmed by [id number] [phone]) on 08/30/2019 8:18:27 AM   Radiology CT ABDOMEN PELVIS W CONTRAST  Result Date: 08/30/2019 CLINICAL DATA:  74 year old female with history of acute nonlocalized abdominal pain. EXAM: CT ABDOMEN AND PELVIS WITH CONTRAST TECHNIQUE: Multidetector CT imaging of the abdomen and pelvis was performed  using the standard protocol following bolus administration of intravenous contrast. CONTRAST:  65 OMNIPAQUE IOHEXOL 300 MG/ML  SOLN COMPARISON:  CT the abdomen and pelvis 09/15/2014. FINDINGS: Lower chest: Aortic atherosclerosis. Hepatobiliary:  Diffuse low attenuation throughout the hepatic parenchyma, indicative of hepatic steatosis. No discrete cystic or solid hepatic lesions. No intra or extrahepatic biliary ductal dilatation. Status post cholecystectomy. Pancreas: Heterogeneous perfusion throughout the pancreas and diffuse surrounding peripancreatic inflammatory changes and small amount of peripancreatic fluid, compatible with acute pancreatitis. No well-defined peripancreatic fluid collections are identified at this time to suggest pancreatic pseudocyst. Spleen: Unremarkable. Adrenals/Urinary Tract: Subcentimeter low-attenuation lesions in both kidneys, too small to characterize, but statistically likely to represent tiny cysts. Bilateral adrenal glands are normal in appearance. No hydroureteronephrosis. Urinary bladder is normal in appearance. Stomach/Bowel: Normal appearance of the stomach. There are several areas of potential discontinuity in the wall of the duodenum involving second and third portions of the duodenum. This could be artifactual related to hypoperfusion in these areas of the wall from adjacent inflammation associated with the groove pancreatitis or could be related to duodenal ulcer disease with or without perforation. Surrounding fluid is noted in the retroperitoneum. No pathologic dilatation of small bowel or colon. Numerous colonic diverticulae are noted, without definite surrounding inflammatory changes to clearly indicate an acute diverticulitis at this time. Vascular/Lymphatic: Aortic atherosclerosis, without evidence of aneurysm or dissection in the abdominal or pelvic vasculature. No lymphadenopathy noted in the abdomen or pelvis. Reproductive: 2 cm lesion in the anterior aspect of  the uterine body, presumably a small fibroid. Ovaries are a trophic. Other: Fluid throughout the retroperitoneum and small bowel mesentery. Trace volume of ascites. No pneumoperitoneum. Musculoskeletal: There are no aggressive appearing lytic or blastic lesions noted in the visualized portions of the skeleton. Posterior fixation hardware at the L4-L5 level. IMPRESSION: 1. Extensive inflammatory changes around the pancreas, compatible with acute pancreatitis. This is most evident in the head of the pancreas, and there are potential areas of discontinuity in the wall of the duodenum involving the second and third portions of the duodenum. This may all simply be related to severe groove pancreatitis, however, further evaluation with endoscopy should be considered to exclude the possibility of perforated duodenal ulcer(s). 2. Hepatic steatosis. 3. Aortic atherosclerosis. 4. Additional incidental findings, as above. Critical Value/emergent results were called by telephone at the time of interpretation on 08/30/2019 at 10:36 am to provider [facility] , who verbally acknowledged these results. Electronically Signed   By: Trudie Reed M.D.   On: 08/30/2019 10:36    Procedures Procedures (including critical care time)  Medications Ordered in ED Medications  sodium chloride 0.9 % bolus 1,000 mL ( Intravenous Stopped 08/30/19 0921)  ondansetron (ZOFRAN) injection 4 mg (4 mg Intravenous Given 08/30/19 0820)  morphine 4 MG/ML injection 4 mg (4 mg Intravenous Given 08/30/19 0821)  iohexol (OMNIPAQUE) 300 MG/ML solution 100 mL (100 mLs Intravenous Contrast Given 08/30/19 0943)  morphine 4 MG/ML injection 4 mg (4 mg Intravenous Given 08/30/19 1111)    ED Course  I have reviewed the triage vital signs and the nursing notes.  Pertinent labs & imaging results that were available during my care of the patient were reviewed by me and considered in my medical decision making (see chart for details).    MDM  Rules/Calculators/A&P                      Pt is much more comfortable after IVFs, morphine, and zofran.  She required 2L oxygen as the morphine dropped her O2 sats to the upper 80s.   Pt is due for her 2nd Covid vaccine shot on Tuesday, 3/9.  Covid negative today.  She is nervous about not getting it.  I am not sure what the protocol is if the pt is in the hospital.  I spoke with Dr. Cliffton Asters (surgery) regarding ? Perforation.  He does not think there is one if there is no free air.  Pt also does not have peritoneal signs on exam.  Pt is d/w Dr. Ella Jubilee ([facility]) who accepted pt for transfer to [facility].  Final Clinical Impression(s) / ED Diagnoses Final diagnoses:  Dehydration  Acute pancreatitis, unspecified complication status, unspecified pancreatitis type    Rx / DC Orders ED Discharge Orders    None       Jacalyn Lefevre, MD 08/30/19 1138

## 2019-08-31 ENCOUNTER — Inpatient Hospital Stay (HOSPITAL_COMMUNITY): Payer: Medicare HMO

## 2019-08-31 LAB — URINALYSIS, ROUTINE W REFLEX MICROSCOPIC
Bacteria, UA: NONE SEEN
Bilirubin Urine: NEGATIVE
Glucose, UA: NEGATIVE mg/dL
Ketones, ur: NEGATIVE mg/dL
Leukocytes,Ua: NEGATIVE
Nitrite: NEGATIVE
Protein, ur: 30 mg/dL — AB
Specific Gravity, Urine: 1.043 — ABNORMAL HIGH (ref 1.005–1.030)
pH: 6 (ref 5.0–8.0)

## 2019-08-31 LAB — COMPREHENSIVE METABOLIC PANEL
ALT: 103 U/L — ABNORMAL HIGH (ref 0–44)
AST: 137 U/L — ABNORMAL HIGH (ref 15–41)
Albumin: 2.9 g/dL — ABNORMAL LOW (ref 3.5–5.0)
Alkaline Phosphatase: 70 U/L (ref 38–126)
Anion gap: 8 (ref 5–15)
BUN: 18 mg/dL (ref 8–23)
CO2: 20 mmol/L — ABNORMAL LOW (ref 22–32)
Calcium: 8 mg/dL — ABNORMAL LOW (ref 8.9–10.3)
Chloride: 107 mmol/L (ref 98–111)
Creatinine, Ser: 0.93 mg/dL (ref 0.44–1.00)
GFR calc Af Amer: >60 mL/min (ref >60)
GFR calc non Af Amer: >60 mL/min (ref >60)
Glucose, Bld: 123 mg/dL — ABNORMAL HIGH (ref 70–99)
Potassium: 4.9 mmol/L (ref 3.5–5.1)
Sodium: 135 mmol/L (ref 135–145)
Total Bilirubin: 1.1 mg/dL (ref 0.3–1.2)
Total Protein: 5.5 g/dL — ABNORMAL LOW (ref 6.5–8.1)

## 2019-08-31 LAB — CBC WITH DIFFERENTIAL/PLATELET
Abs Immature Granulocytes: 0.58 10*3/uL — ABNORMAL HIGH (ref 0.00–0.07)
Basophils Absolute: 0.1 10*3/uL (ref 0.0–0.1)
Basophils Relative: 0 %
Eosinophils Absolute: 0 10*3/uL (ref 0.0–0.5)
Eosinophils Relative: 0 %
HCT: 45.3 % (ref 36.0–46.0)
Hemoglobin: 14.3 g/dL (ref 12.0–15.0)
Immature Granulocytes: 2 %
Lymphocytes Relative: 2 %
Lymphs Abs: 0.9 10*3/uL (ref 0.7–4.0)
MCH: 29.1 pg (ref 26.0–34.0)
MCHC: 31.6 g/dL (ref 30.0–36.0)
MCV: 92.1 fL (ref 80.0–100.0)
Monocytes Absolute: 2.8 10*3/uL — ABNORMAL HIGH (ref 0.1–1.0)
Monocytes Relative: 8 %
Neutro Abs: 32.9 10*3/uL — ABNORMAL HIGH (ref 1.7–7.7)
Neutrophils Relative %: 88 %
Platelets: 264 10*3/uL (ref 150–400)
RBC: 4.92 MIL/uL (ref 3.87–5.11)
RDW: 15.2 % (ref 11.5–15.5)
WBC: 37.2 10*3/uL — ABNORMAL HIGH (ref 4.0–10.5)
nRBC: 0 % (ref 0.0–0.2)

## 2019-08-31 LAB — MAGNESIUM: Magnesium: 2.1 mg/dL (ref 1.7–2.4)

## 2019-08-31 LAB — LACTIC ACID, PLASMA
Lactic Acid, Venous: 2.7 mmol/L (ref 0.5–1.9)
Lactic Acid, Venous: 2.3 mmol/L (ref 0.5–1.9)

## 2019-08-31 LAB — PHOSPHORUS: Phosphorus: 1.9 mg/dL — ABNORMAL LOW (ref 2.5–4.6)

## 2019-08-31 LAB — LIPASE, BLOOD: Lipase: 621 U/L — ABNORMAL HIGH (ref 11–51)

## 2019-08-31 MED ORDER — SODIUM CHLORIDE 0.9 % IV SOLN
2.0000 g | INTRAVENOUS | Status: DC
  Administered 2019-08-31 – 2019-09-04 (×5): 2 g via INTRAVENOUS
  Filled 2019-08-31: qty 20
  Filled 2019-08-31 (×4): qty 2

## 2019-08-31 MED ORDER — LORAZEPAM 2 MG/ML IJ SOLN
0.5000 mg | Freq: Once | INTRAMUSCULAR | Status: AC
  Administered 2019-08-31: 0.5 mg via INTRAVENOUS
  Filled 2019-08-31: qty 1

## 2019-08-31 MED ORDER — GADOBUTROL 1 MMOL/ML IV SOLN: 5.0000 mL | Freq: Once | INTRAVENOUS | Status: DC | PRN

## 2019-08-31 MED ORDER — POTASSIUM CHLORIDE 10 MEQ/100ML IV SOLN
INTRAVENOUS | Status: AC
  Administered 2019-08-31: 10 meq
  Filled 2019-08-31: qty 100

## 2019-08-31 MED ORDER — METRONIDAZOLE IN NACL 5-0.79 MG/ML-% IV SOLN
500.0000 mg | Freq: Three times a day (TID) | INTRAVENOUS | Status: DC
  Administered 2019-08-31 – 2019-09-04 (×13): 500 mg via INTRAVENOUS
  Filled 2019-08-31 (×14): qty 100

## 2019-08-31 NOTE — Consult Note (Signed)
Referring Provider:  Dr. Dow Adolph Primary Care Physician:  Jarrett Soho, PA-C Primary Gastroenterologist:  Dr. Matthias Hughs  Reason for Consultation: Pancreatitis, questionable duodenal ulcer with possible perforation  HPI: Jessica Whitney is a 74 y.o. female admitted from [facility] where she was seen yesterday with severe epigastric abdominal pain following a several day prodrome of low-grade abdominal pain and nausea.  The patient has dementia and the history was obtained from the referring physician and chart review, and from her husband at the bedside.  Initial evaluation was pertinent for a lipase of 1500 with normal liver chemistries, although overnight the transaminases have risen to the 100-150 range (alkaline phosphatase and bilirubin normal).; the patient is status post cholecystectomy for cholelithiasis in 1996.  The patient has also developed significant leukocytosis, with her white count rising from 18,000 yesterday to 37,000 this morning.  No fever.  CT scan shows changes of pancreatitis, with some abnormalities in the duodenum raising the question of possible duodenal ulcer, with or without perforation.  No free air.  Notably, the patient has no history of ulcer disease and has had multiple endoscopies by me, most recently 3 years ago (done for GERD).  From chart review, she has no history of exposure to ulcerogenic medications; she is not currently on a PPI.  Meanwhile, the patient underwent an MRI of the abdomen, just completed, which shows severe pancreatitis but no evidence of choledocholithiasis, suggesting that as is often the case, that the stone has passed, well precipitating an episode of pancreatitis.     Past Medical History:  Diagnosis Date  . Anxiety   . Arthritis   . Diverticulitis   . Fibromyalgia   . GERD (gastroesophageal reflux disease)    bucchini  . Hypothyroidism   . PONV (postoperative nausea and vomiting)   . Spinal stenosis      Past Surgical History:  Procedure Laterality Date  . APPENDECTOMY    . BACK SURGERY    . bone spur shoulder Bilateral   . BREAST EXCISIONAL BIOPSY Right   . BREAST SURGERY Right    lumpectomy non cancerous  . CHOLECYSTECTOMY    . COLON SURGERY     foot of colon removed - diverticulitis  . EYE SURGERY Left    cross-eye fixed  . JOINT REPLACEMENT Right    knee  . knee arthroscopic Right   . LUMBAR LAMINECTOMY/DECOMPRESSION MICRODISCECTOMY N/A 04/03/2013   Procedure: Lumbar Four-Five Lumbar diskectomy with Coflex;  Surgeon: Reinaldo Meeker, MD;  Location: [facility];  Service: Neurosurgery;  Laterality: N/A;  L4-5 Lumbar diskectomy with Coflex  . TRIGGER FINGER RELEASE Right    x2  . TUBAL LIGATION      Prior to Admission medications   Medication Sig Start Date End Date Taking? Authorizing Provider  donepezil (ARICEPT) 10 MG tablet Half pill once daily in the evening for 1 month, then increase to 1 pill each evening thereafter Patient taking differently: Take 10 mg by mouth at bedtime.  07/24/19  Yes Huston Foley, MD  doxycycline (VIBRA-TABS) 100 MG tablet Take 100 mg by mouth 2 (two) times daily. 08/21/19  Yes [provider]  DULoxetine HCl 40 MG CPEP Take 1 tablet by mouth in the morning and at bedtime. 2 tablets per day 05/19/19  Yes [provider]  levothyroxine (SYNTHROID) 100 MCG tablet Take 100 mcg by mouth daily before breakfast.   Yes [provider]  pantoprazole (PROTONIX) 40 MG tablet Take 40 mg by  mouth every morning.   Yes [provider]  vitamin B-12 (CYANOCOBALAMIN) 1000 MCG tablet Take 1,000 mcg by mouth daily.   Yes [provider]    Current Facility-Administered Medications  Medication Dose Route Frequency Provider Last Rate Last Admin  . acetaminophen (TYLENOL) tablet 650 mg  650 mg Oral Q6H PRN Arrien, Jimmy Picket, MD       Or  . acetaminophen (TYLENOL) suppository 650 mg  650 mg Rectal Q6H PRN Arrien,  Jimmy Picket, MD      . cefTRIAXone (ROCEPHIN) 2 g in sodium chloride 0.9 % 100 mL IVPB  2 g Intravenous Q24H Irene Pap N, DO 200 mL/hr at 08/31/19 0649 2 g at 08/31/19 0649  . dextrose 5 % in lactated ringers infusion   Intravenous Continuous Kayleen Memos, DO 75 mL/hr at 08/31/19 1962 New Bag at 08/31/19 2297  . donepezil (ARICEPT) tablet 5 mg  5 mg Oral QHS Arrien, Jimmy Picket, MD   5 mg at 08/30/19 2056  . DULoxetine (CYMBALTA) DR capsule 80 mg  80 mg Oral Daily Arrien, Jimmy Picket, MD   80 mg at 08/30/19 1759  . enoxaparin (LOVENOX) injection 40 mg  40 mg Subcutaneous Q24H Tawni Millers, MD   40 mg at 08/30/19 2056  . levothyroxine (SYNTHROID) tablet 100 mcg  100 mcg Oral QAC breakfast Arrien, Jimmy Picket, MD   100 mcg at 08/31/19 [phone]  . metroNIDAZOLE (FLAGYL) IVPB 500 mg  500 mg Intravenous Q8H Hall, Carole N, DO 100 mL/hr at 08/31/19 0649 500 mg at 08/31/19 0649  . morphine 2 MG/ML injection 2 mg  2 mg Intravenous Q2H PRN Arrien, Jimmy Picket, MD   2 mg at 08/30/19 1800  . ondansetron (ZOFRAN) tablet 4 mg  4 mg Oral Q6H PRN Arrien, Jimmy Picket, MD       Or  . ondansetron [facility]) injection 4 mg  4 mg Intravenous Q6H PRN Arrien, Jimmy Picket, MD      . pantoprazole (PROTONIX) injection 40 mg  40 mg Intravenous Q24H Tawni Millers, MD   40 mg at 08/30/19 1754    Allergies as of 08/30/2019 - Review Complete 08/30/2019  Allergen Reaction Noted  . Sulfa antibiotics Other (See Comments) 03/25/2013    No family history on file.  Social History   Socioeconomic History  . Marital status: Married    Spouse name: Not on file  . Number of children: Not on file  . Years of education: Not on file  . Highest education level: Not on file  Occupational History  . Not on file  Tobacco Use  . Smoking status: Former Research scientist (life sciences)  . Smokeless tobacco: Never Used  Substance and Sexual Activity  . Alcohol use: Yes    Comment: social  . Drug use: No  .  Sexual activity: Not on file  Other Topics Concern  . Not on file  Social History Narrative  . Not on file   Social Determinants of Health   Financial Resource Strain:   . Difficulty of Paying Living Expenses: Not on file  Food Insecurity:   . Worried About Charity fundraiser in the Last Year: Not on file  . Ran Out of Food in the Last Year: Not on file  Transportation Needs:   . Lack of Transportation (Medical): Not on file  . Lack of Transportation (Non-Medical): Not on file  Physical Activity:   . Days of Exercise per Week: Not on file  . Minutes  of Exercise per Session: Not on file  Stress:   . Feeling of Stress : Not on file  Social Connections:   . Frequency of Communication with Friends and Family: Not on file  . Frequency of Social Gatherings with Friends and Family: Not on file  . Attends Religious Services: Not on file  . Active Member of Clubs or Organizations: Not on file  . Attends Banker Meetings: Not on file  . Marital Status: Not on file  Intimate Partner Violence:   . Fear of Current or Ex-Partner: Not on file  . Emotionally Abused: Not on file  . Physically Abused: Not on file  . Sexually Abused: Not on file    Review of Systems:   Physical Exam: Vital signs in last 24 hours: Temp:  [97.7 F (36.5 C)-98.7 F (37.1 C)] 97.9 F (36.6 C) (03/07 0950) Pulse Rate:  [91-105] 105 (03/07 0950) Resp:  [17-23] 22 (03/07 0950) BP: (108-165)/(57-110) 152/85 (03/07 0950) SpO2:  [91 %-99 %] 98 % (03/07 0950) Last BM Date: 08/28/19 The patient looks remarkably good.  She has received morphine and is not in any distress.  She is without icterus.  There is some slight skin pallor.  She is awake and alert and mood is appropriate, and she is appropriately verbal but she has a slightly vacant or distant affect consistent with her underlying dementia.  Chest clear, heart normal with borderline rapid rate, abdomen has mild upper abdominal tenderness and mid  abdominal tenderness, with perhaps some slight fullness, certainly no exquisite tenderness or rigidity.  Bowel sounds are quiet.  Extremities are without edema.  No evident skin rashes or focal neurologic deficits.  Intake/Output from previous day: 03/06 0701 - 03/07 0700 In: 3058.1 [I.V.:755.6; IV Piggyback:2302.5] Out: 600 [Urine:600] Intake/Output this shift: Total I/O In: -  Out: 200 [Urine:200]  Lab Results: Recent Labs    08/30/19 0814 08/30/19 1604 08/31/19 0608  WBC 18.2* 31.1* 37.2*  HGB 15.8* 15.9* 14.3  HCT 47.7* 49.5* 45.3  PLT 393 299 264   BMET Recent Labs    08/30/19 0814 08/30/19 1604 08/31/19 0608  NA 135  --  135  K 3.2*  --  4.9  CL 100  --  107  CO2 21*  --  20*  GLUCOSE 133*  --  123*  BUN 23  --  18  CREATININE 1.06* 0.98 0.93  CALCIUM 9.3  --  8.0*   LFT Recent Labs    08/31/19 0608  PROT 5.5*  ALBUMIN 2.9*  AST 137*  ALT 103*  ALKPHOS 70  BILITOT 1.1   PT/INR No results for input(s): LABPROT, INR in the last 72 hours.  Studies/Results: CT ABDOMEN PELVIS W CONTRAST  Result Date: 08/30/2019 CLINICAL DATA:  75 year old female with history of acute nonlocalized abdominal pain. EXAM: CT ABDOMEN AND PELVIS WITH CONTRAST TECHNIQUE: Multidetector CT imaging of the abdomen and pelvis was performed using the standard protocol following bolus administration of intravenous contrast. CONTRAST:  OMNIPAQUE IOHEXOL 300 MG/ML  SOLN COMPARISON:  CT the abdomen and pelvis 09/15/2014. FINDINGS: Lower chest: Aortic atherosclerosis. Hepatobiliary: Diffuse low attenuation throughout the hepatic parenchyma, indicative of hepatic steatosis. No discrete cystic or solid hepatic lesions. No intra or extrahepatic biliary ductal dilatation. Status post cholecystectomy. Pancreas: Heterogeneous perfusion throughout the pancreas and diffuse surrounding peripancreatic inflammatory changes and small amount of peripancreatic fluid, compatible with acute pancreatitis. No  well-defined peripancreatic fluid collections are identified at this time to suggest pancreatic  pseudocyst. Spleen: Unremarkable. Adrenals/Urinary Tract: Subcentimeter low-attenuation lesions in both kidneys, too small to characterize, but statistically likely to represent tiny cysts. Bilateral adrenal glands are normal in appearance. No hydroureteronephrosis. Urinary bladder is normal in appearance. Stomach/Bowel: Normal appearance of the stomach. There are several areas of potential discontinuity in the wall of the duodenum involving second and third portions of the duodenum. This could be artifactual related to hypoperfusion in these areas of the wall from adjacent inflammation associated with the groove pancreatitis or could be related to duodenal ulcer disease with or without perforation. Surrounding fluid is noted in the retroperitoneum. No pathologic dilatation of small bowel or colon. Numerous colonic diverticulae are noted, without definite surrounding inflammatory changes to clearly indicate an acute diverticulitis at this time. Vascular/Lymphatic: Aortic atherosclerosis, without evidence of aneurysm or dissection in the abdominal or pelvic vasculature. No lymphadenopathy noted in the abdomen or pelvis. Reproductive: 2 cm lesion in the anterior aspect of the uterine body, presumably a small fibroid. Ovaries are a trophic. Other: Fluid throughout the retroperitoneum and small bowel mesentery. Trace volume of ascites. No pneumoperitoneum. Musculoskeletal: There are no aggressive appearing lytic or blastic lesions noted in the visualized portions of the skeleton. Posterior fixation hardware at the L4-L5 level. IMPRESSION: 1. Extensive inflammatory changes around the pancreas, compatible with acute pancreatitis. This is most evident in the head of the pancreas, and there are potential areas of discontinuity in the wall of the duodenum involving the second and third portions of the duodenum. This may all simply  be related to severe groove pancreatitis, however, further evaluation with endoscopy should be considered to exclude the possibility of perforated duodenal ulcer(s). 2. Hepatic steatosis. 3. Aortic atherosclerosis. 4. Additional incidental findings, as above. Critical Value/emergent results were called by telephone at the time of interpretation on 08/30/2019 at 10:36 am to provider [facility] , who verbally acknowledged these results. Electronically Signed   By: Trudie Reed M.D.   On: 08/30/2019 10:36    Impression: 1.  Acute pancreatitis, probable gallstone pancreatitis, as evidenced by history of previous cholecystectomy and rise in liver enzymes following presentation.  Follow-up lipase pending, but drop in hemoglobin with hydration since admission is somewhat encouraging.  2.  Progressively severe leukocytosis, commonly seen in pancreatitis.  3.  Duodenal abnormality on CT of uncertain clinical significance, but without free perforation  Plan: (Discussed with Dr. Margo Aye)  1.  Agree with PPI therapy in case ulcer disease is present  2.  At this time, I do not think there is need for surgical consultation for the possibility of perforated duodenal ulcer, but agree with antibiotics just in case  3.  Aggressive hydration for the next 12 to 24 hours (for example, saline at 300 mL/h), for treatment of pancreatitis.  Bowel rest for now, hope to advance to clears tomorrow.  4.  Will of course need to follow patient clinically and biochemically with labs, with further management to depend on clinical evolution.  I explained to the patient and her husband, at the bedside, that the patient appears to be doing well but that her pancreatitis appears severe radiographically, and that it is possible for complications to develop, and we will be watching for that.   LOS: 1 day   Katy Fitch Savina Olshefski  08/31/2019, 10:25 AM   Pager [phone] If no answer or after 5 PM call [phone]

## 2019-08-31 NOTE — Progress Notes (Signed)
PHARMACY NOTE:  ANTIMICROBIAL RENAL DOSAGE ADJUSTMENT  Current antimicrobial regimen includes a mismatch between antimicrobial dosage and indication.  As per policy approved by the Pharmacy & Therapeutics and Medical Executive Committees, the antimicrobial dosage will be adjusted accordingly.  Current antimicrobial dosage:  Rocephin 1 Gm IV q24h Indication: IAI       Antimicrobial dosage has been changed to:  Rocephin 2 Gm IV q24h  Additional comments:   Thank you for allowing pharmacy to be a part of this patient's care.  Lorenza Evangelist, [facility] 08/31/2019 6:00 AM

## 2019-08-31 NOTE — Progress Notes (Signed)
RN notified attending MD about patient lactic acid 2.7. MD advise to continue patient IV fluid and antibiotic(see MAR).

## 2019-08-31 NOTE — Progress Notes (Signed)
MEWS/VS Documentation      08/31/2019 0833 08/31/2019 0950 08/31/2019 1231 08/31/2019 1408   MEWS Score:  1  2  2  2    MEWS Score Color:  Green  Yellow  Yellow  Yellow   Resp:  --  (!) 22  19  (!) 24   Pulse:  --  (!) 105  (!) 112  (!) 110   BP:  --  (!) 152/85  (!) 153/78  (!) 148/74   Temp:  --  97.9 F (36.6 C)  98.4 F (36.9 C)  98.4 F (36.9 C)   O2 Device:  --  Room Air  Room Air  Room Air   Level of Consciousness:  Alert  --  --  --    MD aware and advised to continue to monitor patient. MEWS protocol initiated.

## 2019-08-31 NOTE — Progress Notes (Signed)
  Patient in yellow MEWS d/t elevated Resp and HR. Husband stated that's patient baseline at home. Attending notified, MEWS protocol initiated. Will continue to monitor.

## 2019-08-31 NOTE — Progress Notes (Signed)
PROGRESS NOTE  [name] VZC:[id number] DOB: 1945-09-29 DOA: 08/30/2019 PCP: Jarrett Soho, PA-C  HPI/Recap of past 24 hours: Jessica Whitney is a 74 y.o. female with medical history significant of  dementia, hypothyroidism, GERD, anxiety, fibromyalgia, post cholecystectomy who presented at [facility] with complaints of severe epigastric abdominal pain of 1 day duration.  Associated with nausea and vomiting.  Lipase greater than 1500.  CT AP with contrast showed evidence of acute pancreatitis and possible duodenal ulcer with perforation.  TRH asked to admit.  Transferred to [facility] for further evaluation and treatment..   08/31/19: Seen and examined.  Drowsy.  Minimally interactive.  Oriented x1.  Rates her abdominal pain 3 out of 10 but looks very uncomfortable laying in bed.  CT scan concerning for possible duodenal ulcer with perforation.  [facility] consulted.  Started on IV antibiotics empirically.  Lactic acid elevated 2.7 slowly trending down on IV fluid.    Discussed with Dr. Matthias Hughs her GI specialist.  Less likely duodenal ulcer with perforation with no prior history of ulcer or use of NSAIDs.  Suspected gallstone pancreatitis.  Patient had a cholecystectomy done in 1996 and was later found to have choledocholithiasis in 1996 as well.  He recommended n.p.o., IV fluid hydration, and MRCP.  Assessment/Plan: Principal Problem:   Acute pancreatitis Active Problems:   Dementia (HCC)   Hypothyroid   GERD (gastroesophageal reflux disease)   Fibromyalgia   Anxiety   Hypokalemia  Sepsis secondary to suspected gallstone pancreatitis with no evidence of pseudocyst or abscess.   Presented with leukocytosis, tachycardia, severe epigastric abdominal pain, nausea vomiting, evidence of acute pancreatitis and possible duodenal ulcer with perforation on CT scan. Discussed with Dr. Matthias Hughs her GI specialist.  Less likely duodenal ulcer with perforation with no prior history of ulcer or use of NSAIDs.   Suspected gallstone pancreatitis.  Patient had a cholecystectomy done in 1996 and was later found to have choledocholithiasis in 1996 as well.  He recommended n.p.o., IV fluid hydration, and MRCP. Lactic elevated this morning 2.7, trending down on IV fluid WBC trending up 37K Ordered blood cultures x2 peripherally  Started empiric IV antibiotic Rocephin and IV Flagyl  Continue IV fluids  Continue pain management  Follow cultures, monitor WBC and fever curve [facility] consulted, Dr. Matthias Hughs will see in consultation.  Appreciate GIs assistance.  Less likely duodenal ulcer with possible perforation on CT scan Reported on CT AP w contrast done on admission Last endoscopy 3 years ago, no evidence of ulcer, no use of NSAIDS Started on IV antibiotics empirically Blood cx x 2 ordered  Defer to GI  Hemoconcentration in the setting of acute pancreatitis  Hemoglobin 15.8 on presentation Continue IV fluid dehydration with hypokalemia.   Hypothyroidism.   Continue levothyroxine  Cognitive impairment/dementia Continue donepezil and duloxetine    DVT prophylaxis:  Enoxaparin sq daily Code Status:  full  Family Communication: Discussed with her husband via phone 08/31/19.   Disposition Plan:  Patient is from home.  Anticipate dc to home in 3-4 days.  Barrier to [employer] ongoing management for suspected acute gallstone pancreatitis, likely intraabdominal infection with less likely duodenal ulcer with possible perforation.  Consults called:  GI       Objective: Vitals:   08/30/19 1400 08/30/19 1612 08/30/19 2040 08/31/19 0544  BP: (!) 160/96 (!) 154/88 131/80 (!) 108/57  Pulse: 91 95 100 (!) 101  Resp: 17 20 18 18   Temp:  98.7 F (37.1 C) 97.7 F (  36.5 C) 97.9 F (36.6 C)  TempSrc:   Oral Oral  SpO2: 97% 99% 99% 97%  Weight:      Height:        Intake/Output Summary (Last 24 hours) at 08/31/2019 0935 Last data filed at 08/31/2019 1937 Gross per 24 hour  Intake 2057.73 ml  Output 800  ml  Net 1257.73 ml   Filed Weights   08/30/19 0802  Weight: 58.5 kg    Exam:  . General: 74 y.o. year-old female well developed well nourished in no acute distress.  Drowsy and minimally interactive. . Cardiovascular: Regular rate and rhythm with no rubs or gallops.  No thyromegaly or JVD noted.   Marland Kitchen Respiratory: Clear to auscultation with no wheezes or rales. Good inspiratory effort. . Abdomen: Soft tenderness diffusely with hypoactive bowel sounds. . Musculoskeletal: No lower extremity edema.  Marland Kitchen Psychiatry: Mood is appropriate for condition and setting   Data Reviewed: CBC: Recent Labs  Lab 08/30/19 0814 08/30/19 1604 08/31/19 0608  WBC 18.2* 31.1* 37.2*  NEUTROABS 15.7*  --  32.9*  HGB 15.8* 15.9* 14.3  HCT 47.7* 49.5* 45.3  MCV 88.0 91.8 92.1  PLT 393 299 902   Basic Metabolic Panel: Recent Labs  Lab 08/30/19 0814 08/30/19 1604 08/31/19 0608  NA 135  --  135  K 3.2*  --  4.9  CL 100  --  107  CO2 21*  --  20*  GLUCOSE 133*  --  123*  BUN 23  --  18  CREATININE 1.06* 0.98 0.93  CALCIUM 9.3  --  8.0*  PHOS  --   --  1.9*   GFR: Estimated Creatinine Clearance: 44.6 mL/min (by C-G formula based on SCr of 0.93 mg/dL). Liver Function Tests: Recent Labs  Lab 08/30/19 0814 08/31/19 0608  AST 44* 137*  ALT 35 103*  ALKPHOS 76 70  BILITOT 1.5* 1.1  PROT 7.2 5.5*  ALBUMIN 4.0 2.9*   Recent Labs  Lab 08/30/19 0814  LIPASE 1,512*   No results for input(s): AMMONIA in the last 168 hours. Coagulation Profile: No results for input(s): INR, PROTIME in the last 168 hours. Cardiac Enzymes: No results for input(s): CKTOTAL, CKMB, CKMBINDEX, TROPONINI in the last 168 hours. BNP (last 3 results) No results for input(s): PROBNP in the last 8760 hours. HbA1C: No results for input(s): HGBA1C in the last 72 hours. CBG: No results for input(s): GLUCAP in the last 168 hours. Lipid Profile: No results for input(s): CHOL, HDL, LDLCALC, TRIG, CHOLHDL, LDLDIRECT in  the last 72 hours. Thyroid Function Tests: No results for input(s): TSH, T4TOTAL, FREET4, T3FREE, THYROIDAB in the last 72 hours. Anemia Panel: No results for input(s): VITAMINB12, FOLATE, FERRITIN, TIBC, IRON, RETICCTPCT in the last 72 hours. Urine analysis:    Component Value Date/Time   COLORURINE YELLOW 08/31/2019 0548   APPEARANCEUR CLEAR 08/31/2019 0548   LABSPEC 1.043 (H) 08/31/2019 0548   PHURINE 6.0 08/31/2019 0548   GLUCOSEU NEGATIVE 08/31/2019 0548   HGBUR MODERATE (A) 08/31/2019 0548   BILIRUBINUR NEGATIVE 08/31/2019 0548   KETONESUR NEGATIVE 08/31/2019 0548   PROTEINUR 30 (A) 08/31/2019 0548   UROBILINOGEN 0.2 09/14/2014 2105   NITRITE NEGATIVE 08/31/2019 0548   LEUKOCYTESUR NEGATIVE 08/31/2019 0548   Sepsis Labs: @LABRCNTIP (procalcitonin:4,lacticidven:4)  ) Recent Results (from the past 240 hour(s))  Respiratory Panel by RT PCR (Flu A&B, Covid) - Nasopharyngeal Swab     Status: None   Collection Time: 08/30/19  9:32 AM   Specimen: Nasopharyngeal  Swab  Result Value Ref Range Status   SARS Coronavirus 2 by RT PCR NEGATIVE NEGATIVE Final    Comment: (NOTE) SARS-CoV-2 target nucleic acids are NOT DETECTED. The SARS-CoV-2 RNA is generally detectable in upper respiratoy specimens during the acute phase of infection. The lowest concentration of SARS-CoV-2 viral copies this assay can detect is 131 copies/mL. A negative result does not preclude SARS-Cov-2 infection and should not be used as the sole basis for treatment or other patient management decisions. A negative result may occur with  improper specimen collection/handling, submission of specimen other than nasopharyngeal swab, presence of viral mutation(s) within the areas targeted by this assay, and inadequate number of viral copies (<131 copies/mL). A negative result must be combined with clinical observations, patient history, and epidemiological information. The expected result is Negative. Fact Sheet for  Patients:  https://www.moore.com/ Fact Sheet for Healthcare Providers:  https://www.young.biz/ This test is not yet ap proved or cleared by the [place] FDA and  has been authorized for detection and/or diagnosis of SARS-CoV-2 by FDA under an Emergency Use Authorization (EUA). This EUA will remain  in effect (meaning this test can be used) for the duration of the COVID-19 declaration under Section 564(b)(1) of the Act, 21 U.S.C. section 360bbb-3(b)(1), unless the authorization is terminated or revoked sooner.    Influenza A by PCR NEGATIVE NEGATIVE Final   Influenza B by PCR NEGATIVE NEGATIVE Final    Comment: (NOTE) The Xpert Xpress SARS-CoV-2/FLU/RSV assay is intended as an aid in  the diagnosis of influenza from Nasopharyngeal swab specimens and  should not be used as a sole basis for treatment. Nasal washings and  aspirates are unacceptable for Xpert Xpress SARS-CoV-2/FLU/RSV  testing. Fact Sheet for Patients: https://www.moore.com/ Fact Sheet for Healthcare Providers: https://www.young.biz/ This test is not yet approved or cleared by the [place] FDA and  has been authorized for detection and/or diagnosis of SARS-CoV-2 by  FDA under an Emergency Use Authorization (EUA). This EUA will remain  in effect (meaning this test can be used) for the duration of the  Covid-19 declaration under Section 564(b)(1) of the Act, 21  U.S.C. section 360bbb-3(b)(1), unless the authorization is  terminated or revoked. Performed at [facility], [address] [place], [place] [address]       Studies: CT ABDOMEN PELVIS W CONTRAST  Result Date: 08/30/2019 CLINICAL DATA:  74 year old female with history of acute nonlocalized abdominal pain. EXAM: CT ABDOMEN AND PELVIS WITH CONTRAST TECHNIQUE: Multidetector CT imaging of the abdomen and pelvis was performed using the standard protocol following bolus  administration of intravenous contrast. CONTRAST:  OMNIPAQUE IOHEXOL 300 MG/ML  SOLN COMPARISON:  CT the abdomen and pelvis 09/15/2014. FINDINGS: Lower chest: Aortic atherosclerosis. Hepatobiliary: Diffuse low attenuation throughout the hepatic parenchyma, indicative of hepatic steatosis. No discrete cystic or solid hepatic lesions. No intra or extrahepatic biliary ductal dilatation. Status post cholecystectomy. Pancreas: Heterogeneous perfusion throughout the pancreas and diffuse surrounding peripancreatic inflammatory changes and small amount of peripancreatic fluid, compatible with acute pancreatitis. No well-defined peripancreatic fluid collections are identified at this time to suggest pancreatic pseudocyst. Spleen: Unremarkable. Adrenals/Urinary Tract: Subcentimeter low-attenuation lesions in both kidneys, too small to characterize, but statistically likely to represent tiny cysts. Bilateral adrenal glands are normal in appearance. No hydroureteronephrosis. Urinary bladder is normal in appearance. Stomach/Bowel: Normal appearance of the stomach. There are several areas of potential discontinuity in the wall of the duodenum involving second and third portions of the duodenum. This could  be artifactual related to hypoperfusion in these areas of the wall from adjacent inflammation associated with the groove pancreatitis or could be related to duodenal ulcer disease with or without perforation. Surrounding fluid is noted in the retroperitoneum. No pathologic dilatation of small bowel or colon. Numerous colonic diverticulae are noted, without definite surrounding inflammatory changes to clearly indicate an acute diverticulitis at this time. Vascular/Lymphatic: Aortic atherosclerosis, without evidence of aneurysm or dissection in the abdominal or pelvic vasculature. No lymphadenopathy noted in the abdomen or pelvis. Reproductive: 2 cm lesion in the anterior aspect of the uterine body, presumably a small  fibroid. Ovaries are a trophic. Other: Fluid throughout the retroperitoneum and small bowel mesentery. Trace volume of ascites. No pneumoperitoneum. Musculoskeletal: There are no aggressive appearing lytic or blastic lesions noted in the visualized portions of the skeleton. Posterior fixation hardware at the L4-L5 level. IMPRESSION: 1. Extensive inflammatory changes around the pancreas, compatible with acute pancreatitis. This is most evident in the head of the pancreas, and there are potential areas of discontinuity in the wall of the duodenum involving the second and third portions of the duodenum. This may all simply be related to severe groove pancreatitis, however, further evaluation with endoscopy should be considered to exclude the possibility of perforated duodenal ulcer(s). 2. Hepatic steatosis. 3. Aortic atherosclerosis. 4. Additional incidental findings, as above. Critical Value/emergent results were called by telephone at the time of interpretation on 08/30/2019 at 10:36 am to provider [facility] , who verbally acknowledged these results. Electronically Signed   By: Trudie Reed M.D.   On: 08/30/2019 10:36    Scheduled Meds: . donepezil  5 mg Oral QHS  . DULoxetine  80 mg Oral Daily  . enoxaparin (LOVENOX) injection  40 mg Subcutaneous Q24H  . levothyroxine  100 mcg Oral QAC breakfast  . pantoprazole (PROTONIX) IV  40 mg Intravenous Q24H    Continuous Infusions: . cefTRIAXone (ROCEPHIN)  IV 2 g (08/31/19 0649)  . dextrose 5% lactated ringers 75 mL/hr at 08/31/19 0642  . metronidazole 500 mg (08/31/19 0649)     LOS: 1 day     Darlin Drop, MD [facility] Pager [phone]  If 7PM-7AM, please contact night-coverage [email or web address] Password [facility] 08/31/2019, 9:35 AM

## 2019-09-01 ENCOUNTER — Telehealth: Payer: Self-pay | Admitting: Neurology

## 2019-09-01 LAB — CBC WITH DIFFERENTIAL/PLATELET
Abs Immature Granulocytes: 0.34 10*3/uL — ABNORMAL HIGH (ref 0.00–0.07)
Basophils Absolute: 0.1 10*3/uL (ref 0.0–0.1)
Basophils Relative: 0 %
Eosinophils Absolute: 0 10*3/uL (ref 0.0–0.5)
Eosinophils Relative: 0 %
HCT: 39.3 % (ref 36.0–46.0)
Hemoglobin: 12.8 g/dL (ref 12.0–15.0)
Immature Granulocytes: 1 %
Lymphocytes Relative: 2 %
Lymphs Abs: 0.7 10*3/uL (ref 0.7–4.0)
MCH: 29.8 pg (ref 26.0–34.0)
MCHC: 32.6 g/dL (ref 30.0–36.0)
MCV: 91.4 fL (ref 80.0–100.0)
Monocytes Absolute: 1.8 10*3/uL — ABNORMAL HIGH (ref 0.1–1.0)
Monocytes Relative: 6 %
Neutro Abs: 26.8 10*3/uL — ABNORMAL HIGH (ref 1.7–7.7)
Neutrophils Relative %: 91 %
Platelets: 211 10*3/uL (ref 150–400)
RBC: 4.3 MIL/uL (ref 3.87–5.11)
RDW: 15.5 % (ref 11.5–15.5)
WBC: 29.7 10*3/uL — ABNORMAL HIGH (ref 4.0–10.5)
nRBC: 0 % (ref 0.0–0.2)

## 2019-09-01 LAB — TSH: TSH: 2.229 u[IU]/mL (ref 0.350–4.500)

## 2019-09-01 LAB — COMPREHENSIVE METABOLIC PANEL
ALT: 125 U/L — ABNORMAL HIGH (ref 0–44)
AST: 140 U/L — ABNORMAL HIGH (ref 15–41)
Albumin: 2.6 g/dL — ABNORMAL LOW (ref 3.5–5.0)
Alkaline Phosphatase: 61 U/L (ref 38–126)
Anion gap: 8 (ref 5–15)
BUN: 18 mg/dL (ref 8–23)
CO2: 22 mmol/L (ref 22–32)
Calcium: 7.7 mg/dL — ABNORMAL LOW (ref 8.9–10.3)
Chloride: 106 mmol/L (ref 98–111)
Creatinine, Ser: 1.08 mg/dL — ABNORMAL HIGH (ref 0.44–1.00)
GFR calc Af Amer: 59 mL/min — ABNORMAL LOW (ref >60)
GFR calc non Af Amer: 51 mL/min — ABNORMAL LOW (ref >60)
Glucose, Bld: 99 mg/dL (ref 70–99)
Potassium: 4 mmol/L (ref 3.5–5.1)
Sodium: 136 mmol/L (ref 135–145)
Total Bilirubin: 0.8 mg/dL (ref 0.3–1.2)
Total Protein: 5.1 g/dL — ABNORMAL LOW (ref 6.5–8.1)

## 2019-09-01 LAB — LIPASE, BLOOD: Lipase: 171 U/L — ABNORMAL HIGH (ref 11–51)

## 2019-09-01 LAB — URINE CULTURE: Culture: NO GROWTH

## 2019-09-01 MED ORDER — LIP MEDEX EX OINT
TOPICAL_OINTMENT | CUTANEOUS | Status: AC
  Filled 2019-09-01: qty 7

## 2019-09-01 NOTE — Progress Notes (Signed)
PROGRESS NOTE  Jessica Whitney FKC:[id number] DOB: [birth year] DOA: 08/30/2019 PCP: [name], PA-C  HPI/Recap of past 24 hours: Jessica Whitney is a 74 y.o. female with medical history significant of  dementia, hypothyroidism, GERD, anxiety, fibromyalgia, post cholecystectomy who presented at [facility] with complaints of severe epigastric abdominal pain of 1 day duration.  Associated with nausea and vomiting.  Lipase greater than 1500.  CT AP with contrast showed evidence of acute pancreatitis and possible duodenal ulcer with perforation.  TRH asked to admit.  Transferred to [facility] for further evaluation and treatment..   CT scan concerning for possible duodenal ulcer with perforation.  GI Olga consulted.  Less likely duodenal ulcer with no prior history of ulcer or NSAID use.  Last EGD was 3 years ago.  Suspected gallstone pancreatitis.  MRCP did not show gallstone likely passed the stone.  Lipase improving.  Leukocytosis is also improving.  On IV antibiotics empirically.  Followed by her gastroenterologist Dr. [name].  09/01/19: Seen and examined reports of mild abdominal pain and intermittent nausea prior to antiemetics.  Seen by GI, will start on clear liquid diet.  Assessment/Plan: Principal Problem:   Acute pancreatitis Active Problems:   Dementia (HCC)   Hypothyroid   GERD (gastroesophageal reflux disease)   Fibromyalgia   Anxiety   Hypokalemia  Improving sepsis secondary to suspected gallstone pancreatitis with no evidence of pseudocyst or abscess.   Presented with leukocytosis, tachycardia, severe epigastric abdominal pain, nausea vomiting, evidence of acute pancreatitis with history of choledocholithiasis. Suspected gallstone pancreatitis.  MRCP did not show any evidence of gallstone, likely passed the stone.   Her gastroenterologist Dr. [name] following.   Leukocytosis improving from 37K to 29K.   Lipase level improving down to 171 from >1500 Continue antibiotics  empirically, Rocephin and IV Flagyl. Blood cultures negative to date.   Continue IV fluids  Continue pain management  Continue to follow cultures, monitor WBC and fever curve  Less likely duodenal ulcer with possible perforation on CT scan Reported on CT AP w contrast done on admission Last endoscopy 3 years ago, no evidence of ulcer, no use of NSAIDS No free air on CT scan Continue IV antibiotics empirically  Resolving hemoconcentration in the setting of acute pancreatitis  Hemoglobin 15.8 on presentation>> 12.8. Continue IV fluid dehydration   Hypothyroidism.   Continue levothyroxine  Cognitive impairment/dementia Continue donepezil and duloxetine    DVT prophylaxis:  Enoxaparin sq daily Code Status:  full  Family Communication: Discussed with her husband via phone 08/31/19.   Disposition Plan:  Patient is from home.  Anticipate discharge in the next 48 to 72 hours.  Barrier to [employer] ongoing: Ongoing management for presumptive gallstone pancreatitis.  Consults called:  GI       Objective: Vitals:   08/31/19 1805 08/31/19 2036 09/01/19 0544 09/01/19 1052  BP: (!) 156/82 121/60 112/61 132/64  Pulse: (!) 113 (!) 107 (!) 104 (!) 108  Resp: (!) 24 20 18 16   Temp: 97.8 F (36.6 C) 97.9 F (36.6 C) 97.7 F (36.5 C) 98.2 F (36.8 C)  TempSrc:  Oral Oral   SpO2: 97% 98% 96% 96%  Weight:      Height:        Intake/Output Summary (Last 24 hours) at 09/01/2019 1348 Last data filed at 09/01/2019 0852 Gross per 24 hour  Intake 1000.48 ml  Output --  Net 1000.48 ml   Filed Weights   08/30/19 0802  Weight: 58.5 kg  Exam:  . General: 74 y.o. year-old female well-developed well-nourished in no acute distress.  Minimally interactive.   . Cardiovascular: Regular rate and rhythm no rubs or gallops.   Marland Kitchen Respiratory: Clear auscultation no wheezes no rales.   . Abdomen: Soft mildly tender diffusely with palpation.  Bowel sounds present.   . Musculoskeletal: No lower  extremity edema bilaterally.   Marland Kitchen Psychiatry: Mood is appropriate for condition and setting.  Data Reviewed: CBC: Recent Labs  Lab 08/30/19 0814 08/30/19 1604 08/31/19 0608 09/01/19 0607  WBC 18.2* 31.1* 37.2* 29.7*  NEUTROABS 15.7*  --  32.9* 26.8*  HGB 15.8* 15.9* 14.3 12.8  HCT 47.7* 49.5* 45.3 39.3  MCV 88.0 91.8 92.1 91.4  PLT 393 299 264 211   Basic Metabolic Panel: Recent Labs  Lab 08/30/19 0814 08/30/19 1604 08/31/19 0554 08/31/19 0608 09/01/19 0607  NA 135  --   --  135 136  K 3.2*  --   --  4.9 4.0  CL 100  --   --  107 106  CO2 21*  --   --  20* 22  GLUCOSE 133*  --   --  123* 99  BUN 23  --   --  18 18  CREATININE 1.06* 0.98  --  0.93 1.08*  CALCIUM 9.3  --   --  8.0* 7.7*  MG  --   --  2.1  --   --   PHOS  --   --   --  1.9*  --    GFR: Estimated Creatinine Clearance: 38.4 mL/min (A) (by C-G formula based on SCr of 1.08 mg/dL (H)). Liver Function Tests: Recent Labs  Lab 08/30/19 0814 08/31/19 0608 09/01/19 0607  AST 44* 137* 140*  ALT 35 103* 125*  ALKPHOS 76 70 61  BILITOT 1.5* 1.1 0.8  PROT 7.2 5.5* 5.1*  ALBUMIN 4.0 2.9* 2.6*   Recent Labs  Lab 08/30/19 0814 08/31/19 0554 09/01/19 0607  LIPASE 1,512* 621* 171*   No results for input(s): AMMONIA in the last 168 hours. Coagulation Profile: No results for input(s): INR, PROTIME in the last 168 hours. Cardiac Enzymes: No results for input(s): CKTOTAL, CKMB, CKMBINDEX, TROPONINI in the last 168 hours. BNP (last 3 results) No results for input(s): PROBNP in the last 8760 hours. HbA1C: No results for input(s): HGBA1C in the last 72 hours. CBG: No results for input(s): GLUCAP in the last 168 hours. Lipid Profile: No results for input(s): CHOL, HDL, LDLCALC, TRIG, CHOLHDL, LDLDIRECT in the last 72 hours. Thyroid Function Tests: Recent Labs    09/01/19 0607  TSH 2.229   Anemia Panel: No results for input(s): VITAMINB12, FOLATE, FERRITIN, TIBC, IRON, RETICCTPCT in the last 72  hours. Urine analysis:    Component Value Date/Time   COLORURINE YELLOW 08/31/2019 0548   APPEARANCEUR CLEAR 08/31/2019 0548   LABSPEC 1.043 (H) 08/31/2019 0548   PHURINE 6.0 08/31/2019 0548   GLUCOSEU NEGATIVE 08/31/2019 0548   HGBUR MODERATE (A) 08/31/2019 0548   BILIRUBINUR NEGATIVE 08/31/2019 0548   KETONESUR NEGATIVE 08/31/2019 0548   PROTEINUR 30 (A) 08/31/2019 0548   UROBILINOGEN 0.2 09/14/2014 2105   NITRITE NEGATIVE 08/31/2019 0548   LEUKOCYTESUR NEGATIVE 08/31/2019 0548   Sepsis Labs: @LABRCNTIP (procalcitonin:4,lacticidven:4)  ) Recent Results (from the past 240 hour(s))  Respiratory Panel by RT PCR (Flu A&B, Covid) - Nasopharyngeal Swab     Status: None   Collection Time: 08/30/19  9:32 AM   Specimen: Nasopharyngeal Swab  Result Value Ref  Range Status   SARS Coronavirus 2 by RT PCR NEGATIVE NEGATIVE Final    Comment: (NOTE) SARS-CoV-2 target nucleic acids are NOT DETECTED. The SARS-CoV-2 RNA is generally detectable in upper respiratoy specimens during the acute phase of infection. The lowest concentration of SARS-CoV-2 viral copies this assay can detect is 131 copies/mL. A negative result does not preclude SARS-Cov-2 infection and should not be used as the sole basis for treatment or other patient management decisions. A negative result may occur with  improper specimen collection/handling, submission of specimen other than nasopharyngeal swab, presence of viral mutation(s) within the areas targeted by this assay, and inadequate number of viral copies (<131 copies/mL). A negative result must be combined with clinical observations, patient history, and epidemiological information. The expected result is Negative. Fact Sheet for Patients:  https://www.moore.com/ Fact Sheet for Healthcare Providers:  https://www.young.biz/ This test is not yet ap proved or cleared by the [place] FDA and  has been authorized for  detection and/or diagnosis of SARS-CoV-2 by FDA under an Emergency Use Authorization (EUA). This EUA will remain  in effect (meaning this test can be used) for the duration of the COVID-19 declaration under Section 564(b)(1) of the Act, 21 U.S.C. section 360bbb-3(b)(1), unless the authorization is terminated or revoked sooner.    Influenza A by PCR NEGATIVE NEGATIVE Final   Influenza B by PCR NEGATIVE NEGATIVE Final    Comment: (NOTE) The Xpert Xpress SARS-CoV-2/FLU/RSV assay is intended as an aid in  the diagnosis of influenza from Nasopharyngeal swab specimens and  should not be used as a sole basis for treatment. Nasal washings and  aspirates are unacceptable for Xpert Xpress SARS-CoV-2/FLU/RSV  testing. Fact Sheet for Patients: https://www.moore.com/ Fact Sheet for Healthcare Providers: https://www.young.biz/ This test is not yet approved or cleared by the [place] FDA and  has been authorized for detection and/or diagnosis of SARS-CoV-2 by  FDA under an Emergency Use Authorization (EUA). This EUA will remain  in effect (meaning this test can be used) for the duration of the  Covid-19 declaration under Section 564(b)(1) of the Act, 21  U.S.C. section 360bbb-3(b)(1), unless the authorization is  terminated or revoked. Performed at [facility], [address] [place], [place] [address]   Culture, Urine     Status: None   Collection Time: 08/31/19  5:48 AM   Specimen: Urine, Random  Result Value Ref Range Status   Specimen Description   Final    URINE, RANDOM Performed at [facility], [address] [place], [place] [address]    Special Requests   Final    NONE Performed at [facility], [address] [place], [place] [address]    Culture   Final    NO GROWTH Performed at [facility], [address] [place], [place] [address]    Report Status 09/01/2019 FINAL  Final   Culture, blood (routine x 2)     Status: None (Preliminary result)   Collection Time: 08/31/19  6:04 AM   Specimen: BLOOD  Result Value Ref Range Status   Specimen Description   Final    BLOOD RIGHT HAND Performed at [facility], [address], [place], [place] [address]    Special Requests   Final    BOTTLES DRAWN AEROBIC ONLY Blood Culture adequate volume Performed at [facility], [address] [place], [place] [address]    Culture   Final  NO GROWTH < 24 HOURS Performed at [facility], [address] [place], [place] [address]    Report Status PENDING  Incomplete  Culture, blood (routine x 2)     Status: None (Preliminary result)   Collection Time: 08/31/19  6:04 AM   Specimen: BLOOD  Result Value Ref Range Status   Specimen Description   Final    BLOOD RIGHT ASSIST CONTROL Performed at [facility], [address] [place], [place] [address]    Special Requests   Final    BOTTLES DRAWN AEROBIC ONLY Blood Culture adequate volume Performed at [facility], [address] [place], [place] [address]    Culture   Final    NO GROWTH < 24 HOURS Performed at [facility], [address] [place], [place] [address]    Report Status PENDING  Incomplete      Studies: MR ABDOMEN MRCP WO CONTRAST  Result Date: 08/31/2019 CLINICAL DATA:  Acute pancreatitis. Severe epigastric abdominal pain. Nausea and vomiting. EXAM: MRI ABDOMEN WITHOUT CONTRAST  (INCLUDING MRCP) TECHNIQUE: Multiplanar multisequence MR imaging of the abdomen was performed. Heavily T2-weighted images of the biliary and pancreatic ducts were obtained, and three-dimensional MRCP images were rendered by post processing. COMPARISON:  CT on 08/30/2019 FINDINGS: Lower chest: Tiny bilateral pleural effusions and bibasilar atelectasis. Hepatobiliary: No masses visualized on this unenhanced exam. Diffuse hepatic steatosis is  demonstrated. Prior cholecystectomy. No evidence of biliary ductal dilatation, with common bile duct measuring 6 mm. No evidence of choledocholithiasis. Pancreas: Diffuse pancreatic edema and moderate peripancreatic inflammatory changes are again seen, consistent with severe acute pancreatitis. No evidence of pancreatic necrosis or mass on this unenhanced exam. Small amount of retroperitoneal and intraperitoneal fluid is seen throughout the abdomen, however no pseudocysts are identified. Spleen:  Within normal limits in size. Adrenals/Urinary tract: Small right renal cyst noted. No evidence of nephrolithiasis or hydronephrosis. Stomach/Bowel: Visualized portion unremarkable. Vascular/Lymphatic: No pathologically enlarged lymph nodes identified. No evidence of abdominal aortic aneurysm. Other:  None. Musculoskeletal:  No suspicious bone lesions identified. IMPRESSION: 1. Severe acute pancreatitis. No evidence of pancreatic necrosis. 2. Small amount of retroperitoneal and intraperitoneal fluid throughout the abdomen. No pseudocysts identified. 3. Prior cholecystectomy. No evidence of biliary ductal dilatation or choledocholithiasis. 4. Diffuse hepatic steatosis. 5. Tiny bilateral pleural effusions and bibasilar atelectasis. Electronically Signed   By: Danae OrleansJohn A Stahl M.D.   On: 08/31/2019 18:23   MR 3D Recon At Scanner  Result Date: 08/31/2019 CLINICAL DATA:  Acute pancreatitis. Severe epigastric abdominal pain. Nausea and vomiting. EXAM: MRI ABDOMEN WITHOUT CONTRAST  (INCLUDING MRCP) TECHNIQUE: Multiplanar multisequence MR imaging of the abdomen was performed. Heavily T2-weighted images of the biliary and pancreatic ducts were obtained, and three-dimensional MRCP images were rendered by post processing. COMPARISON:  CT on 08/30/2019 FINDINGS: Lower chest: Tiny bilateral pleural effusions and bibasilar atelectasis. Hepatobiliary: No masses visualized on this unenhanced exam. Diffuse hepatic steatosis is demonstrated.  Prior cholecystectomy. No evidence of biliary ductal dilatation, with common bile duct measuring 6 mm. No evidence of choledocholithiasis. Pancreas: Diffuse pancreatic edema and moderate peripancreatic inflammatory changes are again seen, consistent with severe acute pancreatitis. No evidence of pancreatic necrosis or mass on this unenhanced exam. Small amount of retroperitoneal and intraperitoneal fluid is seen throughout the abdomen, however no pseudocysts are identified. Spleen:  Within normal limits in size. Adrenals/Urinary tract: Small right renal cyst noted. No evidence of nephrolithiasis or hydronephrosis. Stomach/Bowel: Visualized portion unremarkable. Vascular/Lymphatic: No pathologically  enlarged lymph nodes identified. No evidence of abdominal aortic aneurysm. Other:  None. Musculoskeletal:  No suspicious bone lesions identified. IMPRESSION: 1. Severe acute pancreatitis. No evidence of pancreatic necrosis. 2. Small amount of retroperitoneal and intraperitoneal fluid throughout the abdomen. No pseudocysts identified. 3. Prior cholecystectomy. No evidence of biliary ductal dilatation or choledocholithiasis. 4. Diffuse hepatic steatosis. 5. Tiny bilateral pleural effusions and bibasilar atelectasis. Electronically Signed   By: Danae Orleans M.D.   On: 08/31/2019 18:23    Scheduled Meds: . donepezil  5 mg Oral QHS  . DULoxetine  80 mg Oral Daily  . enoxaparin (LOVENOX) injection  40 mg Subcutaneous Q24H  . levothyroxine  100 mcg Oral QAC breakfast  . lip balm      . pantoprazole (PROTONIX) IV  40 mg Intravenous Q24H    Continuous Infusions: . cefTRIAXone (ROCEPHIN)  IV 2 g (09/01/19 0540)  . dextrose 5% lactated ringers 100 mL/hr at 09/01/19 1227  . metronidazole 500 mg (09/01/19 0616)     LOS: 2 days     Darlin Drop, MD [facility] Pager [phone]  If 7PM-7AM, please contact night-coverage [email or web address] Password [email or web address] 09/01/2019, 1:48 PM

## 2019-09-01 NOTE — Evaluation (Signed)
Physical Therapy Evaluation Patient Details Name: Jessica Whitney MRN: [id number] DOB: [birth year] Today's Date: 09/01/2019   History of Present Illness  Jessica Whitney is a 74 y.o. female with medical history significant of  dementia, hypothyroidism, GERD, anxiety, fibromyalgia, post cholecystectomy who presented at [facility] with complaints of severe epigastric abdominal pain of 1 day duration.  Associated with nausea and vomiting. .  CT AP with contrast showed evidence of acute pancreatitis and possible duodenal ulcer with perforation.  Clinical Impression  The patient presents wit confusion, insisting on walking across the street from where she just came. Patient not oriented to time , nor place, nor situation. Reports that he husband is somewhere else and she needs to talk to him.  Difficult  To redirect patient for safety. . The patient is mobilie with unsteady gait, decreased safety awareness. No family present to determine Dc plan and if patient has 24/7 assistance. Pt admitted with above diagnosis. Pt currently with functional limitations due to the deficits listed below (see PT Problem List). Pt will benefit from skilled PT to increase their independence and safety with mobility to allow discharge to the venue listed below.   Patient's HR  Up to 120 while mobilizing.     Follow Up Recommendations Home health PT;Supervision/Assistance - 24 hour    Equipment Recommendations  None recommended by PT    Recommendations for Other Services       Precautions / Restrictions Precautions Precautions: Fall Precaution Comments: quite confused, decreased ability to redirect.      Mobility  Bed Mobility Overal bed mobility: Needs Assistance Bed Mobility: Supine to Sit;Sit to Supine     Supine to sit: Supervision Sit to supine: Supervision      Transfers Overall transfer level: Needs assistance   Transfers: Sit to/from Stand Sit to Stand: Min guard         General transfer comment:  for safety as  patient is  somewhat impulsive, disregarding IV in arm and obstacles.  Ambulation/Gait Ambulation/Gait assistance: Min assist Gait Distance (Feet): 20 Feet   Gait Pattern/deviations: Step-to pattern;Step-through pattern;Staggering right;Staggering left     General Gait Details: patient impulsive, jumped up and required steady assistance. Redirected patient multiple times as patient wanting to "go across the street"  Stairs            Wheelchair Mobility    Modified Rankin (Stroke Patients Only)       Balance Overall balance assessment: Needs assistance Sitting-balance support: No upper extremity supported       Standing balance support: During functional activity;No upper extremity supported   Standing balance comment: staggering, reaching for objects to steady self                             Pertinent Vitals/Pain Pain Assessment: No/denies pain    Home Living Family/patient expects to be discharged to:: Private residence Living Arrangements: Spouse/significant other Available Help at Discharge: Family             Additional Comments: patient unable to provide indormation other thatn she ambulates    Prior Function           Comments: TBD     Hand Dominance        Extremity/Trunk Assessment        Lower Extremity Assessment Lower Extremity Assessment: Generalized weakness    Cervical / Trunk Assessment Cervical / Trunk Assessment: Normal  Communication   Communication:  No difficulties  Cognition Arousal/Alertness: Awake/alert Behavior During Therapy: Restless Overall Cognitive Status: No family/caregiver present to determine baseline cognitive functioning Area of Impairment: Attention;Memory;Following commands;Awareness                   Current Attention Level: Sustained Memory: Decreased recall of precautions;Decreased short-term memory Following Commands: Follows one step commands  inconsistently   Awareness: Intellectual   General Comments: patient  repeatedly states that she needs to goi find her husband, that she just walked actoss the street and needs to go back. Not oriented to place time and situation. much redirection  required.      General Comments      Exercises     Assessment/Plan    PT Assessment Patient needs continued PT services  PT Problem List Decreased strength;Decreased mobility;Decreased safety awareness;Decreased knowledge of precautions;Decreased activity tolerance;Decreased cognition;Decreased knowledge of use of DME       PT Treatment Interventions Gait training;Therapeutic activities;Cognitive remediation;Therapeutic exercise;Patient/family education;Functional mobility training    PT Goals (Current goals can be found in the Care Plan section)  Acute Rehab PT Goals Patient Stated Goal: to walk across the street PT Goal Formulation: Patient unable to participate in goal setting Time For Goal Achievement: 09/15/19    Frequency Min 3X/week   Barriers to discharge        Co-evaluation               AM-PAC PT "6 Clicks" Mobility  Outcome Measure Help needed turning from your back to your side while in a flat bed without using bedrails?: None Help needed moving from lying on your back to sitting on the side of a flat bed without using bedrails?: None Help needed moving to and from a bed to a chair (including a wheelchair)?: A Little Help needed standing up from a chair using your arms (e.g., wheelchair or bedside chair)?: A Little Help needed to walk in hospital room?: A Little Help needed climbing 3-5 steps with a railing? : A Lot 6 Click Score: 19    End of Session   Activity Tolerance: Patient tolerated treatment well(slightly anxious) Patient left: in bed;with call bell/phone within reach;with bed alarm set Nurse Communication: Mobility status PT Visit Diagnosis: Unsteadiness on feet (R26.81)    Time:  1962-2297 PT Time Calculation (min) (ACUTE ONLY): 14 min   Charges:   PT Evaluation $PT Eval Low Complexity: 1 Low          Blanchard Kelch PT Acute Rehabilitation Services Pager [phone] Office [phone]   Rada Hay 09/01/2019, 11:33 AM

## 2019-09-01 NOTE — Progress Notes (Addendum)
Patient, in the absence of her husband, seems less communicative than she was yesterday afternoon.  However, she is pleasant and in no distress, and asking when she will be able to go home.  On exam, she appears completely comfortable.  Abdomen is quiet, but soft and only mildly tender, interestingly more in the left mid abdominal area compared to the epigastric area.  She is afebrile.  Heart rate slightly rapid.  White count improved  37K -> 30K Hgb coming down with hydration (a good sign that third-spacing is being offset by hydration). Lipase markedly improved 1500-> 621-> 171.  Impression: Radiographically severe, clinically mild to moderate gallstone pancreatitis, resolving without apparent complication so far  Plan: Trial of clear liquids. Discussed with patient's husband.  Florencia Reasons, M.D. Pager [phone] If no answer or after 5 PM call [phone]

## 2019-09-01 NOTE — TOC Progression Note (Signed)
Transition of Care [facility]) - Progression Note    Patient Details  Name: RAIDEN YEARWOOD MRN: [id number] Date of Birth: [birth year]  Transition of Care [facility]) CM/SW Contact  Armanda Heritage, RN Phone Number: 09/01/2019, 12:49 PM  Clinical Narrative:    CM spoke with patient and spouse at bedside regarding recommendation for HHPT.  Patient set up with [facility] for HHPT. No DME needs.  MD please place [facility] order.    Expected Discharge Plan: Home w Home Health Services Barriers to Discharge: Continued Medical Work up  Expected Discharge Plan and Services Expected Discharge Plan: Home w Home Health Services   Discharge Planning Services: CM Consult Post Acute Care Choice: Home Health Living arrangements for the past 2 months: Single Family Home                 DME Arranged: N/A DME Agency: NA       HH Arranged: PT HH Agency: [facility] Date [facility] Agency Contacted: 09/01/19 Time HH Agency Contacted: 1249 Representative spoke with at [facility] Agency: Denyse Amass   Social Determinants of Health (SDOH) Interventions    Readmission Risk Interventions No flowsheet data found.

## 2019-09-01 NOTE — Telephone Encounter (Signed)
Thank you for letting me know

## 2019-09-01 NOTE — Telephone Encounter (Signed)
Ducey,Robert J(husband on DPR) has called to report that pt is in the hospital for acute pancreatitis.  Mr [place] states pt is on morphine. This is a Financial planner, husband has not asked for a call back

## 2019-09-02 ENCOUNTER — Ambulatory Visit: Payer: Medicare HMO

## 2019-09-02 LAB — COMPREHENSIVE METABOLIC PANEL
ALT: 80 U/L — ABNORMAL HIGH (ref 0–44)
AST: 70 U/L — ABNORMAL HIGH (ref 15–41)
Albumin: 2.6 g/dL — ABNORMAL LOW (ref 3.5–5.0)
Alkaline Phosphatase: 68 U/L (ref 38–126)
Anion gap: 6 (ref 5–15)
BUN: 11 mg/dL (ref 8–23)
CO2: 24 mmol/L (ref 22–32)
Calcium: 7.4 mg/dL — ABNORMAL LOW (ref 8.9–10.3)
Chloride: 105 mmol/L (ref 98–111)
Creatinine, Ser: 0.84 mg/dL (ref 0.44–1.00)
GFR calc Af Amer: >60 mL/min (ref >60)
GFR calc non Af Amer: >60 mL/min (ref >60)
Glucose, Bld: 110 mg/dL — ABNORMAL HIGH (ref 70–99)
Potassium: 3.4 mmol/L — ABNORMAL LOW (ref 3.5–5.1)
Sodium: 135 mmol/L (ref 135–145)
Total Bilirubin: 0.7 mg/dL (ref 0.3–1.2)
Total Protein: 5.1 g/dL — ABNORMAL LOW (ref 6.5–8.1)

## 2019-09-02 LAB — CBC WITH DIFFERENTIAL/PLATELET
Abs Immature Granulocytes: 0.07 10*3/uL (ref 0.00–0.07)
Basophils Absolute: 0 10*3/uL (ref 0.0–0.1)
Basophils Relative: 0 %
Eosinophils Absolute: 0 10*3/uL (ref 0.0–0.5)
Eosinophils Relative: 0 %
HCT: 36 % (ref 36.0–46.0)
Hemoglobin: 11.7 g/dL — ABNORMAL LOW (ref 12.0–15.0)
Immature Granulocytes: 0 %
Lymphocytes Relative: 3 %
Lymphs Abs: 0.6 10*3/uL — ABNORMAL LOW (ref 0.7–4.0)
MCH: 29.8 pg (ref 26.0–34.0)
MCHC: 32.5 g/dL (ref 30.0–36.0)
MCV: 91.8 fL (ref 80.0–100.0)
Monocytes Absolute: 1.4 10*3/uL — ABNORMAL HIGH (ref 0.1–1.0)
Monocytes Relative: 7 %
Neutro Abs: 17.9 10*3/uL — ABNORMAL HIGH (ref 1.7–7.7)
Neutrophils Relative %: 90 %
Platelets: 180 10*3/uL (ref 150–400)
RBC: 3.92 MIL/uL (ref 3.87–5.11)
RDW: 15.5 % (ref 11.5–15.5)
WBC: 20 10*3/uL — ABNORMAL HIGH (ref 4.0–10.5)
nRBC: 0 % (ref 0.0–0.2)

## 2019-09-02 LAB — LIPASE, BLOOD: Lipase: 58 U/L — ABNORMAL HIGH (ref 11–51)

## 2019-09-02 MED ORDER — POTASSIUM CHLORIDE CRYS ER 20 MEQ PO TBCR
20.0000 meq | EXTENDED_RELEASE_TABLET | Freq: Every day | ORAL | Status: DC
  Administered 2019-09-02 – 2019-09-05 (×4): 20 meq via ORAL
  Filled 2019-09-02 (×4): qty 1

## 2019-09-02 MED ORDER — TRAZODONE HCL 50 MG PO TABS
50.0000 mg | ORAL_TABLET | Freq: Every evening | ORAL | Status: AC | PRN
  Administered 2019-09-03: 50 mg via ORAL
  Filled 2019-09-02: qty 1

## 2019-09-02 MED ORDER — POTASSIUM CHLORIDE CRYS ER 20 MEQ PO TBCR
40.0000 meq | EXTENDED_RELEASE_TABLET | Freq: Once | ORAL | Status: AC
  Administered 2019-09-02: 40 meq via ORAL
  Filled 2019-09-02: qty 2

## 2019-09-02 NOTE — Progress Notes (Signed)
..    Vital Signs MEWS/VS Documentation      09/02/2019 0935 09/02/2019 1039 09/02/2019 1237 09/02/2019 1407   MEWS Score:  2  2  1  1    MEWS Score Color:  Yellow  Yellow  Green  Green   Resp:  --  (!) 24  20  16    Pulse:  --  (!) 108  (!) 107  (!) 109   BP:  --  139/77  133/71  (!) 146/86   Temp:  --  99.2 F (37.3 C)  99.1 F (37.3 C)  98.2 F (36.8 C)   O2 Device:  --  Room Air  Room Air  --   Level of Consciousness:  Alert  --  --  --     Patient in yellow MEWS d/t resp 24 and temp 96.0. Attending MD notified and ordered to reduced continuous IV fluid rate to 76ml/hr and continue ABX.(see order).      Jessica Whitney 09/02/2019,2:21 PM

## 2019-09-02 NOTE — Progress Notes (Signed)
Patient looks somewhat weary and washed out.  She is not in acute distress, but she does state "I do not feel good."  When pressed on what she means by that, it sounds as though she has some nausea and some ongoing abdominal pain.  She did take a small amount of Sprite earlier.    On exam, she looks somewhat pale.  She is mildly tachycardic.  Bowel sounds are quiet.  The abdomen has very mild diffuse tenderness in the upper abdomen, no significant guarding, no peritoneal findings, no severe tenderness.  Meanwhile, there is continued improvement in all labs.    White count continues to drop, currently 20,000.   Liver chemistries continue to decline, now close to normal in the 70-80 range..   Lipase has dropped significantly, currently 58, down from 1500 at time of admission.   Renal function remains normal and hemoglobin shows an appropriate progressive gradual decline, consistent with adequacy of hydration in the face of significant third spacing.  Impression: Gradual resolution of acute gallstone pancreatitis, without evidence of complication so far  Plan: Continue current management and continue to monitor labs.  Florencia Reasons, M.D. Pager [phone] If no answer or after 5 PM call [phone]

## 2019-09-02 NOTE — Care Management Important Message (Signed)
Important Message  Patient Details IM Letter given to [name] SW Case Manager to present to the Patient Name: Jessica Whitney MRN: [id number] Date of Birth: 1946/02/13   Medicare Important Message Given:  Yes     Caren Macadam 09/02/2019, 10:35 AM

## 2019-09-02 NOTE — Progress Notes (Signed)
PROGRESS NOTE  Jessica Whitney WCB:[id number] DOB: 1945-12-06 DOA: 08/30/2019 PCP: [name], PA-C  HPI/Recap of past 24 hours: Jessica Whitney is a 74 y.o. female with medical history significant of  dementia, hypothyroidism, GERD, anxiety, fibromyalgia, post cholecystectomy who presented at [facility] with complaints of severe epigastric abdominal pain of 1 day duration.  Associated with nausea and vomiting.  Lipase greater than 1500.  CT AP with contrast showed evidence of acute pancreatitis and possible duodenal ulcer with perforation.  TRH asked to admit.  Transferred to [facility] for further evaluation and treatment..   CT scan concerning for possible duodenal ulcer with perforation.  [facility] consulted.  Less likely duodenal ulcer with no prior history of ulcer or NSAID use.  Last EGD was 3 years ago.  Suspected gallstone pancreatitis.  MRCP did not show gallstone likely passed the stone per GI.  Lipase improving.  Leukocytosis is also improving.  On IV antibiotics empirically.  Followed by her gastroenterologist Dr. [name].  Started clear diet on 3/8 but minimal oral intake.  09/02/19:  Denies abd pain or nausea.  Admits to poor oral intake stating she is not hungry.  LFTs and lipase trending down.  Continue IV fluid due to poor oral intake.  Assessment/Plan: Principal Problem:   Acute pancreatitis Active Problems:   Dementia (HCC)   Hypothyroid   GERD (gastroesophageal reflux disease)   Fibromyalgia   Anxiety   Hypokalemia  Improving sepsis secondary to suspected gallstone pancreatitis with no evidence of pseudocyst or abscess.   Presented with leukocytosis, tachycardia, severe epigastric abdominal pain, nausea vomiting, evidence of acute pancreatitis with history of choledocholithiasis. Suspected gallstone pancreatitis.  MRCP did not show any evidence of gallstone, likely passed the stone.   Her gastroenterologist Dr. [name] following.   Leukocytosis improving from 37K to 29K>.  20K.   Lipase level improving down to 58 from 171 from >1500 Continue antibiotics empirically, Rocephin and IV Flagyl, day# 3. Blood cultures negative to date.   Continue IV fluids D%LR at 75 cc/hr until able to tolerate po Continue pain management PRN Continue to follow cultures, monitor WBC and fever curve  Hypokalemia K+ 3.4 Repleted Mg2+ 2.1  Less likely duodenal ulcer with possible perforation on CT scan Reported on CT AP w contrast done on admission Last endoscopy 3 years ago, no evidence of ulcer, no use of NSAIDS No free air on CT scan Continue IV antibiotics empirically  Resolving hemoconcentration in the setting of acute pancreatitis  Hemoglobin 15.8 on presentation>> 12.8. Continue IV fluid dehydration   Hypothyroidism.   Continue levothyroxine  Cognitive impairment/dementia Continue donepezil and duloxetine    DVT prophylaxis:  Enoxaparin sq daily Code Status:  full  Family Communication: Discussed with her husband via phone.   Disposition Plan:  Patient is from home.  Anticipate discharge in the next 48 to 72 hours.  Barrier to [employer] ongoing: Ongoing management for presumptive gallstone pancreatitis, GI sign off.  Consults called:  GI       Objective: Vitals:   09/01/19 1441 09/01/19 2111 09/02/19 0436 09/02/19 1039  BP: 130/67 129/74 135/74 139/77  Pulse: 96 100 (!) 108 (!) 108  Resp: 20 17 17  (!) 24  Temp: 98.2 F (36.8 C) 97.9 F (36.6 C) 97.6 F (36.4 C) 99.2 F (37.3 C)  TempSrc:  Oral Oral Oral  SpO2: 99% 94% 96% 96%  Weight:      Height:        Intake/Output Summary (Last 24 hours)  at 09/02/2019 1234 Last data filed at 09/02/2019 0300 Gross per 24 hour  Intake 1516.59 ml  Output --  Net 1516.59 ml   Filed Weights   08/30/19 0802  Weight: 58.5 kg    Exam:  . General: 74 y.o. year-old female Wd WN NAD alert and interactive . Cardiovascular: RRR no rubs or gallops . Respiratory: CTA no wheezes or rales . Abdomen: soft NT ND NBS   . Musculoskeletal: No LE edema . Psychiatry: Mood is appropriate.  Data Reviewed: CBC: Recent Labs  Lab 08/30/19 0814 08/30/19 1604 08/31/19 0608 09/01/19 0607 09/02/19 0639  WBC 18.2* 31.1* 37.2* 29.7* 20.0*  NEUTROABS 15.7*  --  32.9* 26.8* 17.9*  HGB 15.8* 15.9* 14.3 12.8 11.7*  HCT 47.7* 49.5* 45.3 39.3 36.0  MCV 88.0 91.8 92.1 91.4 91.8  PLT 393 299 264 211 180   Basic Metabolic Panel: Recent Labs  Lab 08/30/19 0814 08/30/19 1604 08/31/19 0554 08/31/19 0608 09/01/19 0607 09/02/19 0639  NA 135  --   --  135 136 135  K 3.2*  --   --  4.9 4.0 3.4*  CL 100  --   --  107 106 105  CO2 21*  --   --  20* 22 24  GLUCOSE 133*  --   --  123* 99 110*  BUN 23  --   --  18 18 11   CREATININE 1.06* 0.98  --  0.93 1.08* 0.84  CALCIUM 9.3  --   --  8.0* 7.7* 7.4*  MG  --   --  2.1  --   --   --   PHOS  --   --   --  1.9*  --   --    GFR: Estimated Creatinine Clearance: 49.3 mL/min (by C-G formula based on SCr of 0.84 mg/dL). Liver Function Tests: Recent Labs  Lab 08/30/19 0814 08/31/19 0608 09/01/19 0607 09/02/19 0639  AST 44* 137* 140* 70*  ALT 35 103* 125* 80*  ALKPHOS 76 70 61 68  BILITOT 1.5* 1.1 0.8 0.7  PROT 7.2 5.5* 5.1* 5.1*  ALBUMIN 4.0 2.9* 2.6* 2.6*   Recent Labs  Lab 08/30/19 0814 08/31/19 0554 09/01/19 0607 09/02/19 0639  LIPASE 1,512* 621* 171* 58*   No results for input(s): AMMONIA in the last 168 hours. Coagulation Profile: No results for input(s): INR, PROTIME in the last 168 hours. Cardiac Enzymes: No results for input(s): CKTOTAL, CKMB, CKMBINDEX, TROPONINI in the last 168 hours. BNP (last 3 results) No results for input(s): PROBNP in the last 8760 hours. HbA1C: No results for input(s): HGBA1C in the last 72 hours. CBG: No results for input(s): GLUCAP in the last 168 hours. Lipid Profile: No results for input(s): CHOL, HDL, LDLCALC, TRIG, CHOLHDL, LDLDIRECT in the last 72 hours. Thyroid Function Tests: Recent Labs    09/01/19 0607   TSH 2.229   Anemia Panel: No results for input(s): VITAMINB12, FOLATE, FERRITIN, TIBC, IRON, RETICCTPCT in the last 72 hours. Urine analysis:    Component Value Date/Time   COLORURINE YELLOW 08/31/2019 0548   APPEARANCEUR CLEAR 08/31/2019 0548   LABSPEC 1.043 (H) 08/31/2019 0548   PHURINE 6.0 08/31/2019 0548   GLUCOSEU NEGATIVE 08/31/2019 0548   HGBUR MODERATE (A) 08/31/2019 0548   BILIRUBINUR NEGATIVE 08/31/2019 0548   KETONESUR NEGATIVE 08/31/2019 0548   PROTEINUR 30 (A) 08/31/2019 0548   UROBILINOGEN 0.2 09/14/2014 2105   NITRITE NEGATIVE 08/31/2019 0548   LEUKOCYTESUR NEGATIVE 08/31/2019 0548   Sepsis Labs: @LABRCNTIP (procalcitonin:4,lacticidven:4)  )  Recent Results (from the past 240 hour(s))  Respiratory Panel by RT PCR (Flu A&B, Covid) - Nasopharyngeal Swab     Status: None   Collection Time: 08/30/19  9:32 AM   Specimen: Nasopharyngeal Swab  Result Value Ref Range Status   SARS Coronavirus 2 by RT PCR NEGATIVE NEGATIVE Final    Comment: (NOTE) SARS-CoV-2 target nucleic acids are NOT DETECTED. The SARS-CoV-2 RNA is generally detectable in upper respiratoy specimens during the acute phase of infection. The lowest concentration of SARS-CoV-2 viral copies this assay can detect is 131 copies/mL. A negative result does not preclude SARS-Cov-2 infection and should not be used as the sole basis for treatment or other patient management decisions. A negative result may occur with  improper specimen collection/handling, submission of specimen other than nasopharyngeal swab, presence of viral mutation(s) within the areas targeted by this assay, and inadequate number of viral copies (<131 copies/mL). A negative result must be combined with clinical observations, patient history, and epidemiological information. The expected result is Negative. Fact Sheet for Patients:  https://www.moore.com/ Fact Sheet for Healthcare Providers:   https://www.young.biz/ This test is not yet ap proved or cleared by the [place] FDA and  has been authorized for detection and/or diagnosis of SARS-CoV-2 by FDA under an Emergency Use Authorization (EUA). This EUA will remain  in effect (meaning this test can be used) for the duration of the COVID-19 declaration under Section 564(b)(1) of the Act, 21 U.S.C. section 360bbb-3(b)(1), unless the authorization is terminated or revoked sooner.    Influenza A by PCR NEGATIVE NEGATIVE Final   Influenza B by PCR NEGATIVE NEGATIVE Final    Comment: (NOTE) The Xpert Xpress SARS-CoV-2/FLU/RSV assay is intended as an aid in  the diagnosis of influenza from Nasopharyngeal swab specimens and  should not be used as a sole basis for treatment. Nasal washings and  aspirates are unacceptable for Xpert Xpress SARS-CoV-2/FLU/RSV  testing. Fact Sheet for Patients: https://www.moore.com/ Fact Sheet for Healthcare Providers: https://www.young.biz/ This test is not yet approved or cleared by the [place] FDA and  has been authorized for detection and/or diagnosis of SARS-CoV-2 by  FDA under an Emergency Use Authorization (EUA). This EUA will remain  in effect (meaning this test can be used) for the duration of the  Covid-19 declaration under Section 564(b)(1) of the Act, 21  U.S.C. section 360bbb-3(b)(1), unless the authorization is  terminated or revoked. Performed at [facility], [address] [place], [place] [address]   Culture, Urine     Status: None   Collection Time: 08/31/19  5:48 AM   Specimen: Urine, Random  Result Value Ref Range Status   Specimen Description   Final    URINE, RANDOM Performed at [facility], [address], [place], [place] [address]    Special Requests   Final    NONE Performed at [facility], [address], [place], [place] [address]    Culture    Final    NO GROWTH Performed at [facility], [address], [place], [place] [address]    Report Status 09/01/2019 FINAL  Final  Culture, blood (routine x 2)     Status: None (Preliminary result)   Collection Time: 08/31/19  6:04 AM   Specimen: BLOOD  Result Value Ref Range Status   Specimen Description   Final    BLOOD RIGHT HAND Performed at [facility], [address] [place], [place] [address]  Special Requests   Final    BOTTLES DRAWN AEROBIC ONLY Blood Culture adequate volume Performed at [facility], [address], [place], [place] [address]    Culture   Final    NO GROWTH < 24 HOURS Performed at [facility], [address] [place], [place] [address]    Report Status PENDING  Incomplete  Culture, blood (routine x 2)     Status: None (Preliminary result)   Collection Time: 08/31/19  6:04 AM   Specimen: BLOOD  Result Value Ref Range Status   Specimen Description   Final    BLOOD RIGHT ASSIST CONTROL Performed at [facility], [address] [place], [place] [address]    Special Requests   Final    BOTTLES DRAWN AEROBIC ONLY Blood Culture adequate volume Performed at [facility], [address], [place], [place] [address]    Culture   Final    NO GROWTH < 24 HOURS Performed at [facility], [address] [place], [place] [address]    Report Status PENDING  Incomplete      Studies: No results found.  Scheduled Meds: . donepezil  5 mg Oral QHS  . DULoxetine  80 mg Oral Daily  . enoxaparin (LOVENOX) injection  40 mg Subcutaneous Q24H  . levothyroxine  100 mcg Oral QAC breakfast  . pantoprazole (PROTONIX) IV  40 mg Intravenous Q24H  . potassium chloride  20 mEq Oral Daily    Continuous Infusions: . cefTRIAXone (ROCEPHIN)  IV 2 g (09/02/19 0510)  . dextrose 5% lactated ringers 100 mL/hr at 09/02/19 0300  . metronidazole 500 mg (09/02/19 0942)     LOS:  3 days     Darlin Drop, MD [facility] Pager [phone]  If 7PM-7AM, please contact night-coverage [email or web address] Password [facility] 09/02/2019, 12:34 PM

## 2019-09-02 NOTE — Evaluation (Signed)
Occupational Therapy Evaluation Patient Details Name: Jessica Whitney MRN: [id number] DOB: 11/19/1945 Today's Date: 09/02/2019    History of Present Illness NAJAT OLAZABAL is a 74 y.o. female with medical history significant of  dementia, hypothyroidism, GERD, anxiety, fibromyalgia, post cholecystectomy who presented at [facility] with complaints of severe epigastric abdominal pain of 1 day duration.  Associated with nausea and vomiting. .  CT AP with contrast showed evidence of acute pancreatitis and possible duodenal ulcer with perforation.   Clinical Impression   Pt admitted with the above. Pt currently with functional limitations due to the deficits listed below (see OT Problem List).  Pt will benefit from skilled OT to increase their safety and independence with ADL and functional mobility for ADL to facilitate discharge to venue listed below.      Follow Up Recommendations  Home health OT;Supervision/Assistance - 24 hour          Precautions / Restrictions Precautions Precautions: Fall Precaution Comments: quite confused, decreased ability to redirect.      Mobility Bed Mobility Overal bed mobility: Needs Assistance Bed Mobility: Supine to Sit;Sit to Supine     Supine to sit: Supervision Sit to supine: Supervision      Transfers Overall transfer level: Needs assistance   Transfers: Sit to/from Stand;Stand Pivot Transfers Sit to Stand: Min guard Stand pivot transfers: Min guard       General transfer comment: for safety as  patient is  somewhat impulsive, disregarding IV in arm and obstacles.    Balance Overall balance assessment: Needs assistance Sitting-balance support: No upper extremity supported       Standing balance support: During functional activity;No upper extremity supported   Standing balance comment: staggering, reaching for objects to steady self                           ADL either performed or assessed with clinical judgement   ADL  Overall ADL's : Needs assistance/impaired Eating/Feeding: Set up;Sitting   Grooming: Set up;Sitting   Upper Body Bathing: Set up;Sitting   Lower Body Bathing: Moderate assistance;Sit to/from stand;Cueing for compensatory techniques;Cueing for safety;Cueing for sequencing   Upper Body Dressing : Set up;Sitting   Lower Body Dressing: Moderate assistance;Cueing for safety;Cueing for sequencing;Cueing for compensatory techniques   Toilet Transfer: Minimal assistance;Stand-pivot;Cueing for safety;Cueing for sequencing   Toileting- Clothing Manipulation and Hygiene: Minimal assistance;Sit to/from stand;Cueing for safety;Cueing for sequencing         General ADL Comments: husband present. pt confused but agreeable to OOB and grooming     Vision Patient Visual Report: No change from baseline              Pertinent Vitals/Pain Pain Assessment: No/denies pain     Hand Dominance     Extremity/Trunk Assessment Upper Extremity Assessment Upper Extremity Assessment: Generalized weakness           Communication Communication Communication: No difficulties   Cognition Arousal/Alertness: Awake/alert Behavior During Therapy: Restless Overall Cognitive Status: No family/caregiver present to determine baseline cognitive functioning Area of Impairment: Attention;Memory;Following commands;Awareness                   Current Attention Level: Sustained Memory: Decreased recall of precautions;Decreased short-term memory Following Commands: Follows one step commands inconsistently   Awareness: Intellectual                  Home Living Family/patient expects to be discharged to:: Private residence  Living Arrangements: Spouse/significant other Available Help at Discharge: Family Type of Home: House       Home Layout: One level     Bathroom Shower/Tub: Occupational psychologist: Standard     Home Equipment: None   Additional Comments: patient unable  to provide indormation other thatn she ambulates               OT Problem List: Decreased strength;Decreased activity tolerance;Impaired vision/perception;Decreased cognition      OT Treatment/Interventions: Self-care/ADL training;Patient/family education;Therapeutic activities    OT Goals(Current goals can be found in the care plan section) Acute Rehab OT Goals Patient Stated Goal: to walk across the street OT Goal Formulation: With patient Time For Goal Achievement: 09/09/19 Potential to Achieve Goals: Good  OT Frequency: Min 2X/week   Barriers to D/C:            Co-evaluation              AM-PAC OT "6 Clicks" Daily Activity     Outcome Measure Help from another person eating meals?: A Little Help from another person taking care of personal grooming?: A Little Help from another person toileting, which includes using toliet, bedpan, or urinal?: A Little Help from another person bathing (including washing, rinsing, drying)?: A Little Help from another person to put on and taking off regular upper body clothing?: A Little Help from another person to put on and taking off regular lower body clothing?: A Little 6 Click Score: 18   End of Session Nurse Communication: Mobility status  Activity Tolerance: Patient tolerated treatment well Patient left: in chair;with family/visitor present  OT Visit Diagnosis: Unsteadiness on feet (R26.81);Muscle weakness (generalized) (M62.81);History of falling (Z91.81)                Time: 1220-1240 OT Time Calculation (min): 20 min Charges:  OT General Charges $OT Visit: 1 Visit OT Evaluation $OT Eval Moderate Complexity: 1 Mod  Kari Baars, OT Acute Rehabilitation Services [phone] Office- [phone], Edwena Felty D 09/02/2019, 3:37 PM

## 2019-09-03 DIAGNOSIS — E860 Dehydration: Secondary | ICD-10-CM

## 2019-09-03 DIAGNOSIS — R933 Abnormal findings on diagnostic imaging of other parts of digestive tract: Secondary | ICD-10-CM

## 2019-09-03 DIAGNOSIS — K859 Acute pancreatitis without necrosis or infection, unspecified: Secondary | ICD-10-CM

## 2019-09-03 LAB — CBC WITH DIFFERENTIAL/PLATELET
Abs Immature Granulocytes: 0.08 10*3/uL — ABNORMAL HIGH (ref 0.00–0.07)
Basophils Absolute: 0 10*3/uL (ref 0.0–0.1)
Basophils Relative: 0 %
Eosinophils Absolute: 0.1 10*3/uL (ref 0.0–0.5)
Eosinophils Relative: 0 %
HCT: 34.4 % — ABNORMAL LOW (ref 36.0–46.0)
Hemoglobin: 11.2 g/dL — ABNORMAL LOW (ref 12.0–15.0)
Immature Granulocytes: 1 %
Lymphocytes Relative: 5 %
Lymphs Abs: 0.8 10*3/uL (ref 0.7–4.0)
MCH: 29.6 pg (ref 26.0–34.0)
MCHC: 32.6 g/dL (ref 30.0–36.0)
MCV: 91 fL (ref 80.0–100.0)
Monocytes Absolute: 1.7 10*3/uL — ABNORMAL HIGH (ref 0.1–1.0)
Monocytes Relative: 10 %
Neutro Abs: 14 10*3/uL — ABNORMAL HIGH (ref 1.7–7.7)
Neutrophils Relative %: 84 %
Platelets: 168 10*3/uL (ref 150–400)
RBC: 3.78 MIL/uL — ABNORMAL LOW (ref 3.87–5.11)
RDW: 15.5 % (ref 11.5–15.5)
WBC: 16.7 10*3/uL — ABNORMAL HIGH (ref 4.0–10.5)
nRBC: 0 % (ref 0.0–0.2)

## 2019-09-03 LAB — COMPREHENSIVE METABOLIC PANEL
ALT: 52 U/L — ABNORMAL HIGH (ref 0–44)
AST: 37 U/L (ref 15–41)
Albumin: 2.4 g/dL — ABNORMAL LOW (ref 3.5–5.0)
Alkaline Phosphatase: 86 U/L (ref 38–126)
Anion gap: 4 — ABNORMAL LOW (ref 5–15)
BUN: 6 mg/dL — ABNORMAL LOW (ref 8–23)
CO2: 26 mmol/L (ref 22–32)
Calcium: 7.3 mg/dL — ABNORMAL LOW (ref 8.9–10.3)
Chloride: 106 mmol/L (ref 98–111)
Creatinine, Ser: 0.78 mg/dL (ref 0.44–1.00)
GFR calc Af Amer: >60 mL/min (ref >60)
GFR calc non Af Amer: >60 mL/min (ref >60)
Glucose, Bld: 131 mg/dL — ABNORMAL HIGH (ref 70–99)
Potassium: 4 mmol/L (ref 3.5–5.1)
Sodium: 136 mmol/L (ref 135–145)
Total Bilirubin: 0.9 mg/dL (ref 0.3–1.2)
Total Protein: 4.9 g/dL — ABNORMAL LOW (ref 6.5–8.1)

## 2019-09-03 LAB — LIPASE, BLOOD: Lipase: 40 U/L (ref 11–51)

## 2019-09-03 MED ORDER — ONDANSETRON HCL 4 MG/2ML IJ SOLN
4.0000 mg | Freq: Three times a day (TID) | INTRAMUSCULAR | Status: DC
  Administered 2019-09-03 – 2019-09-06 (×9): 4 mg via INTRAVENOUS
  Filled 2019-09-03 (×9): qty 2

## 2019-09-03 MED ORDER — HALOPERIDOL LACTATE 5 MG/ML IJ SOLN
0.5000 mg | Freq: Four times a day (QID) | INTRAMUSCULAR | Status: DC | PRN
  Administered 2019-09-03: 0.5 mg via INTRAVENOUS
  Filled 2019-09-03: qty 1

## 2019-09-03 NOTE — Progress Notes (Signed)
Physical Therapy Treatment Patient Details Name: Jessica Whitney MRN: [id number] DOB: [birth year] Today's Date: 09/03/2019    History of Present Illness Jessica Whitney is a 74 y.o. female with medical history significant of  dementia, hypothyroidism, GERD, anxiety, fibromyalgia, post cholecystectomy who presented at [facility] with complaints of severe epigastric abdominal pain of 1 day duration.  Associated with nausea and vomiting. .  CT AP with contrast showed evidence of acute pancreatitis and possible duodenal ulcer with perforation.    PT Comments    The patient is less confused. Spouse present and reports patient was independent PTA.  Patient is impulsive and will  Get up out of bed. Patient ambulated with HHA and holding to rail intermittentently. Plans are to return home.   Follow Up Recommendations  Home health PT;Supervision/Assistance - 24 hour     Equipment Recommendations  None recommended by PT    Recommendations for Other Services       Precautions / Restrictions Precautions Precautions: Fall Precaution Comments: stll impulsive but less confused    Mobility  Bed Mobility Overal bed mobility: Needs Assistance       Supine to sit: Supervision Sit to supine: Supervision   General bed mobility comments: for safety with pads on floor  Transfers Overall transfer level: Needs assistance Equipment used: 1 person hand held assist Transfers: Sit to/from Stand Sit to Stand: Supervision            Ambulation/Gait Ambulation/Gait assistance: Min assist Gait Distance (Feet): 150 Feet Assistive device: 1 person hand held assist Gait Pattern/deviations: Step-through pattern;Drifts right/left Gait velocity: decr   General Gait Details: steady assist and cues for safety with IV pole, tends to forget it's attached   Stairs             Wheelchair Mobility    Modified Rankin (Stroke Patients Only)       Balance Overall balance assessment: Needs  assistance Sitting-balance support: No upper extremity supported Sitting balance-Leahy Scale: Good     Standing balance support: During functional activity;No upper extremity supported Standing balance-Leahy Scale: Poor Standing balance comment: stagering, has had pain meds per spouse                            Cognition Arousal/Alertness: Awake/alert Behavior During Therapy: WFL for tasks assessed/performed Overall Cognitive Status: Impaired/Different from baseline Area of Impairment: Attention;Safety/judgement                   Current Attention Level: Selective Memory: Decreased recall of precautions Following Commands: Follows one step commands with increased time Safety/Judgement: Decreased awareness of safety Awareness: Emergent   General Comments: patient able to focus on conversation, able to recall much of her personal history, has a [place] history book and able to speak about things in her past.      Exercises      General Comments        Pertinent Vitals/Pain Pain Assessment: No/denies pain    Home Living                      Prior Function            PT Goals (current goals can now be found in the care plan section) Progress towards PT goals: Progressing toward goals    Frequency    Min 3X/week      PT Plan Current plan remains appropriate    Co-evaluation  AM-PAC PT "6 Clicks" Mobility   Outcome Measure  Help needed turning from your back to your side while in a flat bed without using bedrails?: None Help needed moving from lying on your back to sitting on the side of a flat bed without using bedrails?: None Help needed moving to and from a bed to a chair (including a wheelchair)?: A Little Help needed standing up from a chair using your arms (e.g., wheelchair or bedside chair)?: A Little Help needed to walk in hospital room?: A Little Help needed climbing 3-5 steps with a railing? : A  Little 6 Click Score: 20    End of Session   Activity Tolerance: Patient tolerated treatment well Patient left: in bed;with call bell/phone within reach;with bed alarm set;with family/visitor present Nurse Communication: Mobility status PT Visit Diagnosis: Unsteadiness on feet (R26.81)     Time: 6503-5465 PT Time Calculation (min) (ACUTE ONLY): 30 min  Charges:  $Gait Training: 8-22 mins $Self Care/Home Management: [place] Pager [phone] Office [phone]    Claretha Cooper 09/03/2019, 3:02 PM

## 2019-09-03 NOTE — Progress Notes (Signed)
Continued improvement in all lab parameters.  In fact, lipase is almost down to normal.  The patient's chief complaint at this time is nausea, and she is really not interested in eating.  She does not complain of significant abdominal pain.  On exam, she is in no distress and the abdomen is nontender, although bowel sounds are quiet.  Impression:  1.  Resolving gallstone pancreatitis 2.  Nausea and poor food intake  Recommendation:  Continue supportive care including antiemetic therapy.    Since the patient's dementia may prevent her from asking for her antinausea medication, I am going to change her Zofran from prn to a scheduled basis.  Consideration could be given to placing a feeding tube, but I am concerned that with her dementia, she might be inclined to pull it out.  Therefore, I would like to give her at least 1 more day to see if resolution of the pancreatitis is associated with improvement in oral intake.  Florencia Reasons, M.D. Pager [phone] If no answer or after 5 PM call [phone]

## 2019-09-03 NOTE — Progress Notes (Signed)
Patient trying to get out of bed agitated and confused. Attending notified and PRN Haldol ordered(see MAR). Will continue to monitor patient.

## 2019-09-03 NOTE — Progress Notes (Signed)
[facility]                                                                              Patient Demographics  Jessica Whitney, is a 74 y.o. female, DOB - 03/21/1946, ZOX:[id number]  Admit date - 08/30/2019   Admitting Physician Mauricio Annett Gulaaniel Arrien, MD  Outpatient Primary MD for the patient is Jarrett SohoWharton, Courtney, PA-C  Outpatient specialists:   LOS - 4  days   Medical records reviewed and are as summarized below:    Chief Complaint  Patient presents with  . Abdominal Pain       Brief summary   Patient is a 74 year old female with dementia, hypothyroidism, GERD, anxiety, fibromyalgia, post cholecystectomy [facility] with severe epigastric abdominal pain of 1 day duration, nausea and vomiting.  Lipase greater than 1500.  CT scan showed acute pancreatitis and possible duodenal ulcer with perforation.  [facility].  GI consulted.   Assessment & Plan    Principal problem Sepsis secondary to pancreatitis, resolving gallstone pancreatitis -Patient presented with leukocytosis, tachycardia, severe epigastric abdominal pain, nausea and vomiting with evidence of acute pancreatitis -MRCP did not show any evidence of gallstones, likely passed a stone.  GI was consulted. -Blood cultures negative till date.  Patient was placed on IV antibiotics empirically, IV Rocephin and Flagyl -Continue IV fluid hydration -Lipase level improving, 40 today,  was 1512 at the time of admission -WBC trended up to 37.2 at the time of admission, now improving, 16.7 -Not eating well, still having nausea, placed on scheduled Zofran, continue clear liquid diet  Hypokalemia Resolved  Less likely duodenal ulcer with possible perforation on CT scan Reported on CT AP w contrast done on admission. Last endoscopy 3 years ago, no evidence of ulcer, no use of NSAIDS.  No free air on CT. GI following, continue IV antibiotics   Hypothyroidism.   Continue Synthroid  Cognitive impairment/dementia Continue donepezil, duloxetine   Code Status: Full CODE STATUS DVT Prophylaxis: lovenox  Family Communication: Discussed all imaging results, lab results, explained to the patient   Disposition Plan: Patient from home, will likely discharge home with home health PT OT once tolerating diet, currently having nausea and not eating   Time Spent in minutes 35 minutes  Procedures:  None  Consultants:   Gastroenterology  Antimicrobials:   Anti-infectives (From admission, onward)   Start     Dose/Rate Route Frequency Ordered Stop   08/31/19 0600  cefTRIAXone (ROCEPHIN) 2 g in sodium chloride 0.9 % 100 mL IVPB     2 g 200 mL/hr over 30 Minutes Intravenous Every 24 hours 08/31/19 0556     08/31/19 0600  metroNIDAZOLE (FLAGYL) IVPB 500 mg     500 mg 100 mL/hr over 60 Minutes Intravenous Every 8 hours 08/31/19 0556            Medications  Scheduled Meds: . donepezil  5 mg Oral QHS  . DULoxetine  80 mg Oral Daily  . enoxaparin (LOVENOX) injection  40 mg Subcutaneous Q24H  . levothyroxine  100 mcg Oral QAC breakfast  . ondansetron (ZOFRAN)  IV  4 mg Intravenous Q8H  . pantoprazole (PROTONIX) IV  40 mg Intravenous Q24H  . potassium chloride  20 mEq Oral Daily   Continuous Infusions: . cefTRIAXone (ROCEPHIN)  IV 2 g (09/03/19 0610)  . dextrose 5% lactated ringers 75 mL/hr at 09/03/19 1305  . metronidazole 500 mg (09/03/19 1313)   PRN Meds:.acetaminophen **OR** acetaminophen, gadobutrol, haloperidol lactate, morphine injection      Subjective:   Jessica Whitney was seen and examined today. Difficulty obtaining review of system from the patient due to dementia.  No fevers, + nausea. No acute events overnight.    Objective:   Vitals:   09/02/19 1840 09/02/19 2143 09/03/19 0636 09/03/19 1351  BP: 137/81 137/87 116/79 123/70  Pulse: (!) 108 (!) 110 (!) 103 97  Resp: 16 18 18 16   Temp: 98.2 F (36.8 C) 98.1 F (36.7  C) 97.7 F (36.5 C) 97.8 F (36.6 C)  TempSrc: Oral  Oral Oral  SpO2: 94% 97% 96% 98%  Weight:      Height:        Intake/Output Summary (Last 24 hours) at 09/03/2019 1646 Last data filed at 09/03/2019 1500 Gross per 24 hour  Intake 540 ml  Output --  Net 540 ml     Wt Readings from Last 3 Encounters:  08/30/19 58.5 kg  07/24/19 60.3 kg  04/21/19 61.2 kg     Exam  General: Alert and oriented x self, NAD  Cardiovascular: S1 S2 auscultated, no murmurs, RRR  Respiratory: Clear to auscultation bilaterally, no wheezing, rales or rhonchi  Gastrointestinal: Soft, nontender, nondistended, + bowel sounds  Ext: no pedal edema bilaterally  Neuro: moving all 4 ext  Musculoskeletal: No digital cyanosis, clubbing  Skin: No rashes  Psych: dementia   Data Reviewed:  I have personally reviewed following labs and imaging studies  Micro Results Recent Results (from the past 240 hour(s))  Respiratory Panel by RT PCR (Flu A&B, Covid) - Nasopharyngeal Swab     Status: None   Collection Time: 08/30/19  9:32 AM   Specimen: Nasopharyngeal Swab  Result Value Ref Range Status   SARS Coronavirus 2 by RT PCR NEGATIVE NEGATIVE Final    Comment: (NOTE) SARS-CoV-2 target nucleic acids are NOT DETECTED. The SARS-CoV-2 RNA is generally detectable in upper respiratoy specimens during the acute phase of infection. The lowest concentration of SARS-CoV-2 viral copies this assay can detect is 131 copies/mL. A negative result does not preclude SARS-Cov-2 infection and should not be used as the sole basis for treatment or other patient management decisions. A negative result may occur with  improper specimen collection/handling, submission of specimen other than nasopharyngeal swab, presence of viral mutation(s) within the areas targeted by this assay, and inadequate number of viral copies (<131 copies/mL). A negative result must be combined with clinical observations, patient history, and  epidemiological information. The expected result is Negative. Fact Sheet for Patients:  PinkCheek.be Fact Sheet for Healthcare Providers:  GravelBags.it This test is not yet ap proved or cleared by the [place] FDA and  has been authorized for detection and/or diagnosis of SARS-CoV-2 by FDA under an Emergency Use Authorization (EUA). This EUA will remain  in effect (meaning this test can be used) for the duration of the COVID-19 declaration under Section 564(b)(1) of the Act, 21 U.S.C. section 360bbb-3(b)(1), unless the authorization is terminated or revoked sooner.    Influenza A by PCR NEGATIVE NEGATIVE Final   Influenza B by PCR NEGATIVE NEGATIVE Final  Comment: (NOTE) The Xpert Xpress SARS-CoV-2/FLU/RSV assay is intended as an aid in  the diagnosis of influenza from Nasopharyngeal swab specimens and  should not be used as a sole basis for treatment. Nasal washings and  aspirates are unacceptable for Xpert Xpress SARS-CoV-2/FLU/RSV  testing. Fact Sheet for Patients: https://www.moore.com/ Fact Sheet for Healthcare Providers: https://www.young.biz/ This test is not yet approved or cleared by the [place] FDA and  has been authorized for detection and/or diagnosis of SARS-CoV-2 by  FDA under an Emergency Use Authorization (EUA). This EUA will remain  in effect (meaning this test can be used) for the duration of the  Covid-19 declaration under Section 564(b)(1) of the Act, 21  U.S.C. section 360bbb-3(b)(1), unless the authorization is  terminated or revoked. Performed at [facility], [address] [place], [place] [address]   Culture, Urine     Status: None   Collection Time: 08/31/19  5:48 AM   Specimen: Urine, Random  Result Value Ref Range Status   Specimen Description   Final    URINE, RANDOM Performed at [facility], [address], [place], [place] [address]    Special Requests   Final    NONE Performed at [facility], [address], [place], [place] [address]    Culture   Final    NO GROWTH Performed at [facility], [address], [place], [place] [address]    Report Status 09/01/2019 FINAL  Final  Culture, blood (routine x 2)     Status: None (Preliminary result)   Collection Time: 08/31/19  6:04 AM   Specimen: BLOOD  Result Value Ref Range Status   Specimen Description   Final    BLOOD RIGHT HAND Performed at [facility], [address] [place], [place] [address]    Special Requests   Final    BOTTLES DRAWN AEROBIC ONLY Blood Culture adequate volume Performed at [facility], [address], [place], [place] [address]    Culture   Final    NO GROWTH 3 DAYS Performed at [facility], [address], [place], [place] [address]    Report Status PENDING  Incomplete  Culture, blood (routine x 2)     Status: None (Preliminary result)   Collection Time: 08/31/19  6:04 AM   Specimen: BLOOD  Result Value Ref Range Status   Specimen Description   Final    BLOOD RIGHT ASSIST CONTROL Performed at [facility], [address] [place], [place] [address]    Special Requests   Final    BOTTLES DRAWN AEROBIC ONLY Blood Culture adequate volume Performed at [facility], [address], [place], [place] [address]    Culture   Final    NO GROWTH 3 DAYS Performed at [facility], [address] [place], [place] [address]    Report Status PENDING  Incomplete    Radiology Reports CT ABDOMEN PELVIS W CONTRAST  Result Date: 08/30/2019 CLINICAL DATA:  74 year old female with history of acute nonlocalized abdominal pain. EXAM: CT ABDOMEN AND PELVIS WITH CONTRAST TECHNIQUE: Multidetector CT imaging of the abdomen and pelvis was performed using the standard protocol following bolus  administration of intravenous contrast. CONTRAST:  OMNIPAQUE IOHEXOL 300 MG/ML  SOLN COMPARISON:  CT the abdomen and pelvis 09/15/2014. FINDINGS: Lower chest: Aortic atherosclerosis. Hepatobiliary: Diffuse low attenuation throughout the hepatic parenchyma, indicative of hepatic steatosis. No  discrete cystic or solid hepatic lesions. No intra or extrahepatic biliary ductal dilatation. Status post cholecystectomy. Pancreas: Heterogeneous perfusion throughout the pancreas and diffuse surrounding peripancreatic inflammatory changes and small amount of peripancreatic fluid, compatible with acute pancreatitis. No well-defined peripancreatic fluid collections are identified at this time to suggest pancreatic pseudocyst. Spleen: Unremarkable. Adrenals/Urinary Tract: Subcentimeter low-attenuation lesions in both kidneys, too small to characterize, but statistically likely to represent tiny cysts. Bilateral adrenal glands are normal in appearance. No hydroureteronephrosis. Urinary bladder is normal in appearance. Stomach/Bowel: Normal appearance of the stomach. There are several areas of potential discontinuity in the wall of the duodenum involving second and third portions of the duodenum. This could be artifactual related to hypoperfusion in these areas of the wall from adjacent inflammation associated with the groove pancreatitis or could be related to duodenal ulcer disease with or without perforation. Surrounding fluid is noted in the retroperitoneum. No pathologic dilatation of small bowel or colon. Numerous colonic diverticulae are noted, without definite surrounding inflammatory changes to clearly indicate an acute diverticulitis at this time. Vascular/Lymphatic: Aortic atherosclerosis, without evidence of aneurysm or dissection in the abdominal or pelvic vasculature. No lymphadenopathy noted in the abdomen or pelvis. Reproductive: 2 cm lesion in the anterior aspect of the uterine body, presumably a small  fibroid. Ovaries are a trophic. Other: Fluid throughout the retroperitoneum and small bowel mesentery. Trace volume of ascites. No pneumoperitoneum. Musculoskeletal: There are no aggressive appearing lytic or blastic lesions noted in the visualized portions of the skeleton. Posterior fixation hardware at the L4-L5 level. IMPRESSION: 1. Extensive inflammatory changes around the pancreas, compatible with acute pancreatitis. This is most evident in the head of the pancreas, and there are potential areas of discontinuity in the wall of the duodenum involving the second and third portions of the duodenum. This may all simply be related to severe groove pancreatitis, however, further evaluation with endoscopy should be considered to exclude the possibility of perforated duodenal ulcer(s). 2. Hepatic steatosis. 3. Aortic atherosclerosis. 4. Additional incidental findings, as above. Critical Value/emergent results were called by telephone at the time of interpretation on 08/30/2019 at 10:36 am to provider [facility] , who verbally acknowledged these results. Electronically Signed   By: Trudie Reed M.D.   On: 08/30/2019 10:36   MR ABDOMEN MRCP WO CONTRAST  Result Date: 08/31/2019 CLINICAL DATA:  Acute pancreatitis. Severe epigastric abdominal pain. Nausea and vomiting. EXAM: MRI ABDOMEN WITHOUT CONTRAST  (INCLUDING MRCP) TECHNIQUE: Multiplanar multisequence MR imaging of the abdomen was performed. Heavily T2-weighted images of the biliary and pancreatic ducts were obtained, and three-dimensional MRCP images were rendered by post processing. COMPARISON:  CT on 08/30/2019 FINDINGS: Lower chest: Tiny bilateral pleural effusions and bibasilar atelectasis. Hepatobiliary: No masses visualized on this unenhanced exam. Diffuse hepatic steatosis is demonstrated. Prior cholecystectomy. No evidence of biliary ductal dilatation, with common bile duct measuring 6 mm. No evidence of choledocholithiasis. Pancreas: Diffuse  pancreatic edema and moderate peripancreatic inflammatory changes are again seen, consistent with severe acute pancreatitis. No evidence of pancreatic necrosis or mass on this unenhanced exam. Small amount of retroperitoneal and intraperitoneal fluid is seen throughout the abdomen, however no pseudocysts are identified. Spleen:  Within normal limits in size. Adrenals/Urinary tract: Small right renal cyst noted. No evidence of nephrolithiasis or hydronephrosis. Stomach/Bowel: Visualized portion unremarkable. Vascular/Lymphatic: No pathologically enlarged lymph nodes identified. No evidence of abdominal aortic aneurysm. Other:  None. Musculoskeletal:  No suspicious bone lesions identified. IMPRESSION: 1. Severe acute pancreatitis. No evidence of pancreatic necrosis.  2. Small amount of retroperitoneal and intraperitoneal fluid throughout the abdomen. No pseudocysts identified. 3. Prior cholecystectomy. No evidence of biliary ductal dilatation or choledocholithiasis. 4. Diffuse hepatic steatosis. 5. Tiny bilateral pleural effusions and bibasilar atelectasis. Electronically Signed   By: Danae Orleans M.D.   On: 08/31/2019 18:23   MR 3D Recon At Scanner  Result Date: 08/31/2019 CLINICAL DATA:  Acute pancreatitis. Severe epigastric abdominal pain. Nausea and vomiting. EXAM: MRI ABDOMEN WITHOUT CONTRAST  (INCLUDING MRCP) TECHNIQUE: Multiplanar multisequence MR imaging of the abdomen was performed. Heavily T2-weighted images of the biliary and pancreatic ducts were obtained, and three-dimensional MRCP images were rendered by post processing. COMPARISON:  CT on 08/30/2019 FINDINGS: Lower chest: Tiny bilateral pleural effusions and bibasilar atelectasis. Hepatobiliary: No masses visualized on this unenhanced exam. Diffuse hepatic steatosis is demonstrated. Prior cholecystectomy. No evidence of biliary ductal dilatation, with common bile duct measuring 6 mm. No evidence of choledocholithiasis. Pancreas: Diffuse pancreatic  edema and moderate peripancreatic inflammatory changes are again seen, consistent with severe acute pancreatitis. No evidence of pancreatic necrosis or mass on this unenhanced exam. Small amount of retroperitoneal and intraperitoneal fluid is seen throughout the abdomen, however no pseudocysts are identified. Spleen:  Within normal limits in size. Adrenals/Urinary tract: Small right renal cyst noted. No evidence of nephrolithiasis or hydronephrosis. Stomach/Bowel: Visualized portion unremarkable. Vascular/Lymphatic: No pathologically enlarged lymph nodes identified. No evidence of abdominal aortic aneurysm. Other:  None. Musculoskeletal:  No suspicious bone lesions identified. IMPRESSION: 1. Severe acute pancreatitis. No evidence of pancreatic necrosis. 2. Small amount of retroperitoneal and intraperitoneal fluid throughout the abdomen. No pseudocysts identified. 3. Prior cholecystectomy. No evidence of biliary ductal dilatation or choledocholithiasis. 4. Diffuse hepatic steatosis. 5. Tiny bilateral pleural effusions and bibasilar atelectasis. Electronically Signed   By: Danae Orleans M.D.   On: 08/31/2019 18:23    Lab Data:  CBC: Recent Labs  Lab 08/30/19 0814 08/30/19 0814 08/30/19 1604 08/31/19 0608 09/01/19 0607 09/02/19 0639 09/03/19 0628  WBC 18.2*   < > 31.1* 37.2* 29.7* 20.0* 16.7*  NEUTROABS 15.7*  --   --  32.9* 26.8* 17.9* 14.0*  HGB 15.8*   < > 15.9* 14.3 12.8 11.7* 11.2*  HCT 47.7*   < > 49.5* 45.3 39.3 36.0 34.4*  MCV 88.0   < > 91.8 92.1 91.4 91.8 91.0  PLT 393   < > 299 264 211 180 168   < > = values in this interval not displayed.   Basic Metabolic Panel: Recent Labs  Lab 08/30/19 0814 08/30/19 0814 08/30/19 1604 08/31/19 0554 08/31/19 9379 09/01/19 0607 09/02/19 0639 09/03/19 0628  NA 135  --   --   --  135 136 135 136  K 3.2*  --   --   --  4.9 4.0 3.4* 4.0  CL 100  --   --   --  107 106 105 106  CO2 21*  --   --   --  20* 22 24 26   GLUCOSE 133*  --   --   --   123* 99 110* 131*  BUN 23  --   --   --  18 18 11  6*  CREATININE 1.06*   < > 0.98  --  0.93 1.08* 0.84 0.78  CALCIUM 9.3  --   --   --  8.0* 7.7* 7.4* 7.3*  MG  --   --   --  2.1  --   --   --   --  PHOS  --   --   --   --  1.9*  --   --   --    < > = values in this interval not displayed.   GFR: Estimated Creatinine Clearance: 51.8 mL/min (by C-G formula based on SCr of 0.78 mg/dL). Liver Function Tests: Recent Labs  Lab 08/30/19 0814 08/31/19 0608 09/01/19 0607 09/02/19 0639 09/03/19 0628  AST 44* 137* 140* 70* 37  ALT 35 103* 125* 80* 52*  ALKPHOS 76 70 61 68 86  BILITOT 1.5* 1.1 0.8 0.7 0.9  PROT 7.2 5.5* 5.1* 5.1* 4.9*  ALBUMIN 4.0 2.9* 2.6* 2.6* 2.4*   Recent Labs  Lab 08/30/19 0814 08/31/19 0554 09/01/19 0607 09/02/19 0639 09/03/19 0628  LIPASE 1,512* 621* 171* 58* 40   No results for input(s): AMMONIA in the last 168 hours. Coagulation Profile: No results for input(s): INR, PROTIME in the last 168 hours. Cardiac Enzymes: No results for input(s): CKTOTAL, CKMB, CKMBINDEX, TROPONINI in the last 168 hours. BNP (last 3 results) No results for input(s): PROBNP in the last 8760 hours. HbA1C: No results for input(s): HGBA1C in the last 72 hours. CBG: No results for input(s): GLUCAP in the last 168 hours. Lipid Profile: No results for input(s): CHOL, HDL, LDLCALC, TRIG, CHOLHDL, LDLDIRECT in the last 72 hours. Thyroid Function Tests: Recent Labs    09/01/19 0607  TSH 2.229   Anemia Panel: No results for input(s): VITAMINB12, FOLATE, FERRITIN, TIBC, IRON, RETICCTPCT in the last 72 hours. Urine analysis:    Component Value Date/Time   COLORURINE YELLOW 08/31/2019 0548   APPEARANCEUR CLEAR 08/31/2019 0548   LABSPEC 1.043 (H) 08/31/2019 0548   PHURINE 6.0 08/31/2019 0548   GLUCOSEU NEGATIVE 08/31/2019 0548   HGBUR MODERATE (A) 08/31/2019 0548   BILIRUBINUR NEGATIVE 08/31/2019 0548   KETONESUR NEGATIVE 08/31/2019 0548   PROTEINUR 30 (A) 08/31/2019 0548    UROBILINOGEN 0.2 09/14/2014 2105   NITRITE NEGATIVE 08/31/2019 0548   LEUKOCYTESUR NEGATIVE 08/31/2019 0548     Makalynn Berwanger M.D. [facility] 09/03/2019, 4:47 PM   Call night coverage person covering after 7pm

## 2019-09-04 LAB — CBC WITH DIFFERENTIAL/PLATELET
Abs Immature Granulocytes: 0.11 10*3/uL — ABNORMAL HIGH (ref 0.00–0.07)
Basophils Absolute: 0 10*3/uL (ref 0.0–0.1)
Basophils Relative: 0 %
Eosinophils Absolute: 0.1 10*3/uL (ref 0.0–0.5)
Eosinophils Relative: 0 %
HCT: 33.9 % — ABNORMAL LOW (ref 36.0–46.0)
Hemoglobin: 11 g/dL — ABNORMAL LOW (ref 12.0–15.0)
Immature Granulocytes: 1 %
Lymphocytes Relative: 6 %
Lymphs Abs: 0.9 10*3/uL (ref 0.7–4.0)
MCH: 29.6 pg (ref 26.0–34.0)
MCHC: 32.4 g/dL (ref 30.0–36.0)
MCV: 91.1 fL (ref 80.0–100.0)
Monocytes Absolute: 2.1 10*3/uL — ABNORMAL HIGH (ref 0.1–1.0)
Monocytes Relative: 14 %
Neutro Abs: 12.1 10*3/uL — ABNORMAL HIGH (ref 1.7–7.7)
Neutrophils Relative %: 79 %
Platelets: 167 10*3/uL (ref 150–400)
RBC: 3.72 MIL/uL — ABNORMAL LOW (ref 3.87–5.11)
RDW: 15.6 % — ABNORMAL HIGH (ref 11.5–15.5)
WBC: 15.2 10*3/uL — ABNORMAL HIGH (ref 4.0–10.5)
nRBC: 0 % (ref 0.0–0.2)

## 2019-09-04 LAB — COMPREHENSIVE METABOLIC PANEL
ALT: 42 U/L (ref 0–44)
AST: 51 U/L — ABNORMAL HIGH (ref 15–41)
Albumin: 2.5 g/dL — ABNORMAL LOW (ref 3.5–5.0)
Alkaline Phosphatase: 146 U/L — ABNORMAL HIGH (ref 38–126)
Anion gap: 5 (ref 5–15)
BUN: 7 mg/dL — ABNORMAL LOW (ref 8–23)
CO2: 23 mmol/L (ref 22–32)
Calcium: 7 mg/dL — ABNORMAL LOW (ref 8.9–10.3)
Chloride: 104 mmol/L (ref 98–111)
Creatinine, Ser: 0.85 mg/dL (ref 0.44–1.00)
GFR calc Af Amer: >60 mL/min (ref >60)
GFR calc non Af Amer: >60 mL/min (ref >60)
Glucose, Bld: 110 mg/dL — ABNORMAL HIGH (ref 70–99)
Potassium: 3.9 mmol/L (ref 3.5–5.1)
Sodium: 132 mmol/L — ABNORMAL LOW (ref 135–145)
Total Bilirubin: 0.7 mg/dL (ref 0.3–1.2)
Total Protein: 5 g/dL — ABNORMAL LOW (ref 6.5–8.1)

## 2019-09-04 LAB — LIPASE, BLOOD: Lipase: 38 U/L (ref 11–51)

## 2019-09-04 MED ORDER — MORPHINE SULFATE (PF) 2 MG/ML IV SOLN: 1.0000 mg | INTRAVENOUS | Status: DC | PRN

## 2019-09-04 MED ORDER — LIDOCAINE 5 % EX PTCH
1.0000 | MEDICATED_PATCH | Freq: Every day | CUTANEOUS | Status: DC
  Administered 2019-09-04 – 2019-09-06 (×3): 1 via TRANSDERMAL
  Filled 2019-09-04 (×3): qty 1

## 2019-09-04 MED ORDER — DONEPEZIL HCL 10 MG PO TABS
10.0000 mg | ORAL_TABLET | Freq: Every day | ORAL | Status: DC
  Administered 2019-09-04 – 2019-09-05 (×2): 10 mg via ORAL
  Filled 2019-09-04 (×2): qty 1

## 2019-09-04 MED ORDER — SODIUM CHLORIDE 0.9 % IV SOLN: INTRAVENOUS | Status: DC

## 2019-09-04 NOTE — Care Management Important Message (Signed)
Important Message  Patient Details IM Letter given to [name] SW Case Manager to present to the Patient Name: Jessica Whitney MRN: [id number] Date of Birth: [birth year]   Medicare Important Message Given:  Yes     Caren Macadam 09/04/2019, 10:49 AM

## 2019-09-04 NOTE — Progress Notes (Addendum)
Pt looks much more chipper today.  Less nausea, taking small amounts of clear liquids.  Lipase remains normal, as it was yesterday.  White count continues to improve, currently 15,200, compared to previous value of 37,000 when checked 4 days ago.  LFT's almost normal, slight upward fluctuation from yesterday.  Scheduled Zofran dosing was initiated yesterday.  EXAM: sitting up on side of bed, husband at bedside. Heart rate less rapid, around 85. Chest clear at both bases (no clinical pleural effusion).  Bowels sounds are finally present (first time I've heard them this admission); mild upper/RUQ abd tenderness w/out guarding/rigidity.  IMPR: resolving pancreatitis  PLAN:  1. Consider TF if no improvement in PO intake in next 1-2 days.   However, I think we are on the verge of "turning the corner"  2. Have d/c'd antbx (no clear indication; want to reduce risk of C diff, plus metronidazole make increase nausea)  Florencia Reasons, M.D. Pager [phone] If no answer or after 5 PM call [phone]

## 2019-09-04 NOTE — Progress Notes (Signed)
[facility]                                                                              Patient Demographics  Jessica Whitney, is a 74 y.o. female, DOB - 24-Apr-1946, QIO:[id number]  Admit date - 08/30/2019   Admitting Physician Mauricio Annett Gula, MD  Outpatient Primary MD for the patient is Jarrett Soho, PA-C  Outpatient specialists:   LOS - 5  days   Medical records reviewed and are as summarized below:    Chief Complaint  Patient presents with  . Abdominal Pain       Brief summary   Patient is a 74 year old female with dementia, hypothyroidism, GERD, anxiety, fibromyalgia, post cholecystectomy presented to [facility] with severe epigastric abdominal pain of 1 day duration, nausea and vomiting.  Lipase greater than 1500.  CT scan showed acute pancreatitis and possible duodenal ulcer with perforation.  Patient was transferred to [facility] for further treatment.  GI consulted.   Assessment & Plan    Principal problem Sepsis secondary to pancreatitis, resolving gallstone pancreatitis -Patient presented with leukocytosis, tachycardia, severe epigastric abdominal pain, nausea and vomiting with evidence of acute pancreatitis -MRCP did not show any evidence of gallstones, likely passed a stone.  GI was consulted. -Blood cultures negative till date.  Patient was placed on IV antibiotics empirically, IV Rocephin and Flagyl -Continue IV fluid hydration, lipase improving, 38, was 1512 at the time of admission WBC is improving, 15.2 -Advance diet to full liquids, if eating better, will advance to soft diet in a.m.  Hypokalemia Resolved  Less likely duodenal ulcer with possible perforation on CT scan Reported on CT AP w contrast done on admission. Last endoscopy 3 years ago, no evidence of ulcer, no use of NSAIDS.  No free air on CT. -GI following, continue antibiotics   Hypothyroidism.  Continue Synthroid  Cognitive impairment/dementia Continue donepezil,  duloxetine  Back pain, chronic -Added Lidoderm patch, decreased morphine (per husband at the bedside, has been getting confused with pain medication)   Code Status: Full CODE STATUS DVT Prophylaxis: lovenox  Family Communication: Discussed all imaging results, lab results, explained to the patient and patient's husband on the phone  Disposition Plan: Patient from home, will likely discharge home with home health PT OT once tolerating diet   Time Spent in minutes 25 minutes  Procedures:  None  Consultants:   Gastroenterology  Antimicrobials:   Anti-infectives (From admission, onward)   Start     Dose/Rate Route Frequency Ordered Stop   08/31/19 0600  cefTRIAXone (ROCEPHIN) 2 g in sodium chloride 0.9 % 100 mL IVPB  Status:  Discontinued     2 g 200 mL/hr over 30 Minutes Intravenous Every 24 hours 08/31/19 0556 09/04/19 1204   08/31/19 0600  metroNIDAZOLE (FLAGYL) IVPB 500 mg  Status:  Discontinued     500 mg 100 mL/hr over 60 Minutes Intravenous Every 8 hours 08/31/19 0556 09/04/19 1204         Medications  Scheduled Meds: . donepezil  10 mg Oral QHS  . DULoxetine  80 mg Oral Daily  . enoxaparin (LOVENOX) injection  40 mg  Subcutaneous Q24H  . levothyroxine  100 mcg Oral QAC breakfast  . lidocaine  1 patch Transdermal Daily  . ondansetron (ZOFRAN) IV  4 mg Intravenous Q8H  . pantoprazole (PROTONIX) IV  40 mg Intravenous Q24H  . potassium chloride  20 mEq Oral Daily   Continuous Infusions: . dextrose 5% lactated ringers 75 mL/hr at 09/04/19 1617   PRN Meds:.acetaminophen **OR** acetaminophen, gadobutrol, haloperidol lactate, morphine injection      Subjective:   Jessica Whitney was seen and examined today.  Complaining of back pain today more than the abdominal pain.  Per husband at the bedside, she feels somewhat confused after IV pain medication.  Nausea improving.  No acute events overnight.  Afebrile.  No chest pain or shortness of breath.    Objective:    Vitals:   09/03/19 0636 09/03/19 1351 09/03/19 2113 09/04/19 0555  BP: 116/79 123/70 113/63 130/79  Pulse: (!) 103 97 (!) 102 (!) 106  Resp: 18 16 18 18   Temp: 97.7 F (36.5 C) 97.8 F (36.6 C) 97.7 F (36.5 C) 97.9 F (36.6 C)  TempSrc: Oral Oral Oral Oral  SpO2: 96% 98% 96% 95%  Weight:      Height:        Intake/Output Summary (Last 24 hours) at 09/04/2019 1656 Last data filed at 09/04/2019 1400 Gross per 24 hour  Intake 1546 ml  Output --  Net 1546 ml     Wt Readings from Last 3 Encounters:  08/30/19 58.5 kg  07/24/19 60.3 kg  04/21/19 61.2 kg   Physical Exam  General: Alert and oriented x self and place, uncomfortable due to back pain  Cardiovascular: S1 S2 clear, RRR. No pedal edema b/l  Respiratory: CTAB, no wheezing, rales or rhonchi  Gastrointestinal: Soft, minimal tenderness,  nondistended, NBS  Ext: no pedal edema bilaterally  Neuro: no new deficits  Musculoskeletal: No cyanosis, clubbing  Skin: No rashes  Psych: Dementia     Data Reviewed:  I have personally reviewed following labs and imaging studies  Micro Results Recent Results (from the past 240 hour(s))  Respiratory Panel by RT PCR (Flu A&B, Covid) - Nasopharyngeal Swab     Status: None   Collection Time: 08/30/19  9:32 AM   Specimen: Nasopharyngeal Swab  Result Value Ref Range Status   SARS Coronavirus 2 by RT PCR NEGATIVE NEGATIVE Final    Comment: (NOTE) SARS-CoV-2 target nucleic acids are NOT DETECTED. The SARS-CoV-2 RNA is generally detectable in upper respiratoy specimens during the acute phase of infection. The lowest concentration of SARS-CoV-2 viral copies this assay can detect is 131 copies/mL. A negative result does not preclude SARS-Cov-2 infection and should not be used as the sole basis for treatment or other patient management decisions. A negative result may occur with  improper specimen collection/handling, submission of specimen other than nasopharyngeal swab,  presence of viral mutation(s) within the areas targeted by this assay, and inadequate number of viral copies (<131 copies/mL). A negative result must be combined with clinical observations, patient history, and epidemiological information. The expected result is Negative. Fact Sheet for Patients:  10/30/19 Fact Sheet for Healthcare Providers:  https://www.moore.com/ This test is not yet ap proved or cleared by the https://www.young.biz/ FDA and  has been authorized for detection and/or diagnosis of SARS-CoV-2 by FDA under an Emergency Use Authorization (EUA). This EUA will remain  in effect (meaning this test can be used) for the duration of the COVID-19 declaration under Section 564(b)(1) of the  Act, 21 U.S.C. section 360bbb-3(b)(1), unless the authorization is terminated or revoked sooner.    Influenza A by PCR NEGATIVE NEGATIVE Final   Influenza B by PCR NEGATIVE NEGATIVE Final    Comment: (NOTE) The Xpert Xpress SARS-CoV-2/FLU/RSV assay is intended as an aid in  the diagnosis of influenza from Nasopharyngeal swab specimens and  should not be used as a sole basis for treatment. Nasal washings and  aspirates are unacceptable for Xpert Xpress SARS-CoV-2/FLU/RSV  testing. Fact Sheet for Patients: PinkCheek.be Fact Sheet for Healthcare Providers: GravelBags.it This test is not yet approved or cleared by the [place] FDA and  has been authorized for detection and/or diagnosis of SARS-CoV-2 by  FDA under an Emergency Use Authorization (EUA). This EUA will remain  in effect (meaning this test can be used) for the duration of the  Covid-19 declaration under Section 564(b)(1) of the Act, 21  U.S.C. section 360bbb-3(b)(1), unless the authorization is  terminated or revoked. Performed at [facility], [address] [place], [place] [address]   Culture, Urine     Status:  None   Collection Time: 08/31/19  5:48 AM   Specimen: Urine, Random  Result Value Ref Range Status   Specimen Description   Final    URINE, RANDOM Performed at [facility], [place] [address], [place], [place] [address]    Special Requests   Final    NONE Performed at [facility], [place] [address] [place], [place] [address]    Culture   Final    NO GROWTH Performed at [facility], [place] [address] [place], [place] [address]    Report Status 09/01/2019 FINAL  Final  Culture, blood (routine x 2)     Status: None (Preliminary result)   Collection Time: 08/31/19  6:04 AM   Specimen: BLOOD  Result Value Ref Range Status   Specimen Description   Final    BLOOD RIGHT HAND Performed at [place] [address] [place], [place] [address]    Special Requests   Final    BOTTLES DRAWN AEROBIC ONLY Blood Culture adequate volume Performed at [place] [address] [place], [place] [address]    Culture   Final    NO GROWTH 4 DAYS Performed at [facility], [place] [address], [place], [place] [address]    Report Status PENDING  Incomplete  Culture, blood (routine x 2)     Status: None (Preliminary result)   Collection Time: 08/31/19  6:04 AM   Specimen: BLOOD  Result Value Ref Range Status   Specimen Description   Final    BLOOD RIGHT ASSIST CONTROL Performed at [place] [address] [place], [place] [address]    Special Requests   Final    BOTTLES DRAWN AEROBIC ONLY Blood Culture adequate volume Performed at [place] [address], [place], [place] [address]    Culture   Final    NO GROWTH 4 DAYS Performed at [facility], [place] [address] [place], [place] [address]    Report Status PENDING  Incomplete    Radiology Reports CT ABDOMEN PELVIS W CONTRAST  Result Date: 08/30/2019 CLINICAL DATA:  74 year old female with history of  acute nonlocalized abdominal pain. EXAM: CT ABDOMEN AND PELVIS WITH CONTRAST TECHNIQUE: Multidetector CT imaging of the abdomen and pelvis was performed using the standard protocol following bolus administration of intravenous contrast.  CONTRAST:  OMNIPAQUE IOHEXOL 300 MG/ML  SOLN COMPARISON:  CT the abdomen and pelvis 09/15/2014. FINDINGS: Lower chest: Aortic atherosclerosis. Hepatobiliary: Diffuse low attenuation throughout the hepatic parenchyma, indicative of hepatic steatosis. No discrete cystic or solid hepatic lesions. No intra or extrahepatic biliary ductal dilatation. Status post cholecystectomy. Pancreas: Heterogeneous perfusion throughout the pancreas and diffuse surrounding peripancreatic inflammatory changes and small amount of peripancreatic fluid, compatible with acute pancreatitis. No well-defined peripancreatic fluid collections are identified at this time to suggest pancreatic pseudocyst. Spleen: Unremarkable. Adrenals/Urinary Tract: Subcentimeter low-attenuation lesions in both kidneys, too small to characterize, but statistically likely to represent tiny cysts. Bilateral adrenal glands are normal in appearance. No hydroureteronephrosis. Urinary bladder is normal in appearance. Stomach/Bowel: Normal appearance of the stomach. There are several areas of potential discontinuity in the wall of the duodenum involving second and third portions of the duodenum. This could be artifactual related to hypoperfusion in these areas of the wall from adjacent inflammation associated with the groove pancreatitis or could be related to duodenal ulcer disease with or without perforation. Surrounding fluid is noted in the retroperitoneum. No pathologic dilatation of small bowel or colon. Numerous colonic diverticulae are noted, without definite surrounding inflammatory changes to clearly indicate an acute diverticulitis at this time. Vascular/Lymphatic: Aortic atherosclerosis, without evidence of aneurysm or  dissection in the abdominal or pelvic vasculature. No lymphadenopathy noted in the abdomen or pelvis. Reproductive: 2 cm lesion in the anterior aspect of the uterine body, presumably a small fibroid. Ovaries are a trophic. Other: Fluid throughout the retroperitoneum and small bowel mesentery. Trace volume of ascites. No pneumoperitoneum. Musculoskeletal: There are no aggressive appearing lytic or blastic lesions noted in the visualized portions of the skeleton. Posterior fixation hardware at the L4-L5 level. IMPRESSION: 1. Extensive inflammatory changes around the pancreas, compatible with acute pancreatitis. This is most evident in the head of the pancreas, and there are potential areas of discontinuity in the wall of the duodenum involving the second and third portions of the duodenum. This may all simply be related to severe groove pancreatitis, however, further evaluation with endoscopy should be considered to exclude the possibility of perforated duodenal ulcer(s). 2. Hepatic steatosis. 3. Aortic atherosclerosis. 4. Additional incidental findings, as above. Critical Value/emergent results were called by telephone at the time of interpretation on 08/30/2019 at 10:36 am to provider [facility] , who verbally acknowledged these results. Electronically Signed   By: Trudie Reed M.D.   On: 08/30/2019 10:36   MR ABDOMEN MRCP WO CONTRAST  Result Date: 08/31/2019 CLINICAL DATA:  Acute pancreatitis. Severe epigastric abdominal pain. Nausea and vomiting. EXAM: MRI ABDOMEN WITHOUT CONTRAST  (INCLUDING MRCP) TECHNIQUE: Multiplanar multisequence MR imaging of the abdomen was performed. Heavily T2-weighted images of the biliary and pancreatic ducts were obtained, and three-dimensional MRCP images were rendered by post processing. COMPARISON:  CT on 08/30/2019 FINDINGS: Lower chest: Tiny bilateral pleural effusions and bibasilar atelectasis. Hepatobiliary: No masses visualized on this unenhanced exam. Diffuse hepatic  steatosis is demonstrated. Prior cholecystectomy. No evidence of biliary ductal dilatation, with common bile duct measuring 6 mm. No evidence of choledocholithiasis. Pancreas: Diffuse pancreatic edema and moderate peripancreatic inflammatory changes are again seen, consistent with severe acute pancreatitis. No evidence of pancreatic necrosis or mass on this unenhanced exam. Small amount of retroperitoneal and intraperitoneal fluid is seen throughout the abdomen, however no pseudocysts are identified. Spleen:  Within normal limits in size. Adrenals/Urinary tract: Small right renal cyst noted. No evidence of nephrolithiasis or hydronephrosis. Stomach/Bowel: Visualized  portion unremarkable. Vascular/Lymphatic: No pathologically enlarged lymph nodes identified. No evidence of abdominal aortic aneurysm. Other:  None. Musculoskeletal:  No suspicious bone lesions identified. IMPRESSION: 1. Severe acute pancreatitis. No evidence of pancreatic necrosis. 2. Small amount of retroperitoneal and intraperitoneal fluid throughout the abdomen. No pseudocysts identified. 3. Prior cholecystectomy. No evidence of biliary ductal dilatation or choledocholithiasis. 4. Diffuse hepatic steatosis. 5. Tiny bilateral pleural effusions and bibasilar atelectasis. Electronically Signed   By: Danae Orleans M.D.   On: 08/31/2019 18:23   MR 3D Recon At Scanner  Result Date: 08/31/2019 CLINICAL DATA:  Acute pancreatitis. Severe epigastric abdominal pain. Nausea and vomiting. EXAM: MRI ABDOMEN WITHOUT CONTRAST  (INCLUDING MRCP) TECHNIQUE: Multiplanar multisequence MR imaging of the abdomen was performed. Heavily T2-weighted images of the biliary and pancreatic ducts were obtained, and three-dimensional MRCP images were rendered by post processing. COMPARISON:  CT on 08/30/2019 FINDINGS: Lower chest: Tiny bilateral pleural effusions and bibasilar atelectasis. Hepatobiliary: No masses visualized on this unenhanced exam. Diffuse hepatic steatosis is  demonstrated. Prior cholecystectomy. No evidence of biliary ductal dilatation, with common bile duct measuring 6 mm. No evidence of choledocholithiasis. Pancreas: Diffuse pancreatic edema and moderate peripancreatic inflammatory changes are again seen, consistent with severe acute pancreatitis. No evidence of pancreatic necrosis or mass on this unenhanced exam. Small amount of retroperitoneal and intraperitoneal fluid is seen throughout the abdomen, however no pseudocysts are identified. Spleen:  Within normal limits in size. Adrenals/Urinary tract: Small right renal cyst noted. No evidence of nephrolithiasis or hydronephrosis. Stomach/Bowel: Visualized portion unremarkable. Vascular/Lymphatic: No pathologically enlarged lymph nodes identified. No evidence of abdominal aortic aneurysm. Other:  None. Musculoskeletal:  No suspicious bone lesions identified. IMPRESSION: 1. Severe acute pancreatitis. No evidence of pancreatic necrosis. 2. Small amount of retroperitoneal and intraperitoneal fluid throughout the abdomen. No pseudocysts identified. 3. Prior cholecystectomy. No evidence of biliary ductal dilatation or choledocholithiasis. 4. Diffuse hepatic steatosis. 5. Tiny bilateral pleural effusions and bibasilar atelectasis. Electronically Signed   By: Danae Orleans M.D.   On: 08/31/2019 18:23    Lab Data:  CBC: Recent Labs  Lab 08/31/19 0608 09/01/19 0607 09/02/19 0639 09/03/19 0628 09/04/19 0543  WBC 37.2* 29.7* 20.0* 16.7* 15.2*  NEUTROABS 32.9* 26.8* 17.9* 14.0* 12.1*  HGB 14.3 12.8 11.7* 11.2* 11.0*  HCT 45.3 39.3 36.0 34.4* 33.9*  MCV 92.1 91.4 91.8 91.0 91.1  PLT 264 211 180 168 167   Basic Metabolic Panel: Recent Labs  Lab 08/30/19 0814 08/31/19 0554 08/31/19 0608 09/01/19 0607 09/02/19 0639 09/03/19 0628 09/04/19 0543  NA   < >  --  135 136 135 136 132*  K   < >  --  4.9 4.0 3.4* 4.0 3.9  CL   < >  --  107 106 105 106 104  CO2   < >  --  20* 22 24 26 23   GLUCOSE   < >  --  123*  99 110* 131* 110*  BUN   < >  --  18 18 11  6* 7*  CREATININE   < >  --  0.93 1.08* 0.84 0.78 0.85  CALCIUM   < >  --  8.0* 7.7* 7.4* 7.3* 7.0*  MG  --  2.1  --   --   --   --   --   PHOS  --   --  1.9*  --   --   --   --    < > = values in this interval not displayed.  GFR: Estimated Creatinine Clearance: 48.8 mL/min (by C-G formula based on SCr of 0.85 mg/dL). Liver Function Tests: Recent Labs  Lab 08/31/19 0608 09/01/19 0607 09/02/19 0639 09/03/19 0628 09/04/19 0543  AST 137* 140* 70* 37 51*  ALT 103* 125* 80* 52* 42  ALKPHOS 70 61 68 86 146*  BILITOT 1.1 0.8 0.7 0.9 0.7  PROT 5.5* 5.1* 5.1* 4.9* 5.0*  ALBUMIN 2.9* 2.6* 2.6* 2.4* 2.5*   Recent Labs  Lab 08/31/19 0554 09/01/19 0607 09/02/19 0639 09/03/19 0628 09/04/19 0543  LIPASE 621* 171* 58* 40 38   No results for input(s): AMMONIA in the last 168 hours. Coagulation Profile: No results for input(s): INR, PROTIME in the last 168 hours. Cardiac Enzymes: No results for input(s): CKTOTAL, CKMB, CKMBINDEX, TROPONINI in the last 168 hours. BNP (last 3 results) No results for input(s): PROBNP in the last 8760 hours. HbA1C: No results for input(s): HGBA1C in the last 72 hours. CBG: No results for input(s): GLUCAP in the last 168 hours. Lipid Profile: No results for input(s): CHOL, HDL, LDLCALC, TRIG, CHOLHDL, LDLDIRECT in the last 72 hours. Thyroid Function Tests: No results for input(s): TSH, T4TOTAL, FREET4, T3FREE, THYROIDAB in the last 72 hours. Anemia Panel: No results for input(s): VITAMINB12, FOLATE, FERRITIN, TIBC, IRON, RETICCTPCT in the last 72 hours. Urine analysis:    Component Value Date/Time   COLORURINE YELLOW 08/31/2019 0548   APPEARANCEUR CLEAR 08/31/2019 0548   LABSPEC 1.043 (H) 08/31/2019 0548   PHURINE 6.0 08/31/2019 0548   GLUCOSEU NEGATIVE 08/31/2019 0548   HGBUR MODERATE (A) 08/31/2019 0548   BILIRUBINUR NEGATIVE 08/31/2019 0548   KETONESUR NEGATIVE 08/31/2019 0548   PROTEINUR 30 (A)  08/31/2019 0548   UROBILINOGEN 0.2 09/14/2014 2105   NITRITE NEGATIVE 08/31/2019 0548   LEUKOCYTESUR NEGATIVE 08/31/2019 0548     Valina Maes M.D. [facility] 09/04/2019, 4:56 PM   Call night coverage person covering after 7pm

## 2019-09-04 NOTE — Progress Notes (Signed)
Occupational Therapy Treatment Patient Details Name: Jessica Whitney MRN: [id number] DOB: 02/21/1946 Today's Date: 09/04/2019    History of present illness Jessica Whitney is a 74 y.o. female with medical history significant of  dementia, hypothyroidism, GERD, anxiety, fibromyalgia, post cholecystectomy who presented at [facility] with complaints of severe epigastric abdominal pain of 1 day duration.  Associated with nausea and vomiting. .  CT AP with contrast showed evidence of acute pancreatitis and possible duodenal ulcer with perforation.   OT comments  Pt agreeable to OT and OOB.  Pt more clear this day.  Husband not present  Follow Up Recommendations  Home health OT;Supervision/Assistance - 24 hour          Precautions / Restrictions Precautions Precautions: Fall       Mobility Bed Mobility Overal bed mobility: Needs Assistance       Supine to sit: Supervision Sit to supine: Supervision   General bed mobility comments: for safety with pads on floor  Transfers Overall transfer level: Needs assistance Equipment used: 1 person hand held assist Transfers: Sit to/from Stand Sit to Stand: Supervision              Balance Overall balance assessment: Needs assistance Sitting-balance support: No upper extremity supported Sitting balance-Leahy Scale: Good     Standing balance support: During functional activity;Bilateral upper extremity supported Standing balance-Leahy Scale: Fair                             ADL either performed or assessed with clinical judgement   ADL Overall ADL's : Needs assistance/impaired Eating/Feeding: Set up;Sitting   Grooming: Set up;Sitting;Wash/dry hands;Oral care;Brushing hair                   Toilet Transfer: Stand-pivot;Cueing for safety;Cueing for sequencing;Min guard   Toileting- Clothing Manipulation and Hygiene: Minimal assistance;Sit to/from stand;Cueing for safety;Cueing for sequencing         General  ADL Comments: pt with less confusion this day     Vision Patient Visual Report: No change from baseline            Cognition Arousal/Alertness: Awake/alert Behavior During Therapy: WFL for tasks assessed/performed Overall Cognitive Status: Impaired/Different from baseline(pt stated ' i know i have been confused but i am better and wanna go home'')                                 General Comments: patient able to focus on conversation, able to recall much of her personal history, has a [employer] history book and able to speak about things in her past.                   Pertinent Vitals/ Pain       Pain Assessment: 0-10 Pain Score: 4  Pain Location: back Pain Descriptors / Indicators: Dull;Discomfort Pain Intervention(s): Repositioned;Monitored during session         Frequency  Min 2X/week        Progress Toward Goals  OT Goals(current goals can now be found in the care plan section)  Progress towards OT goals: Progressing toward goals     Plan Discharge plan remains appropriate       AM-PAC OT "6 Clicks" Daily Activity     Outcome Measure   Help from another person eating meals?: A Little Help from another person taking  care of personal grooming?: A Little Help from another person toileting, which includes using toliet, bedpan, or urinal?: A Little Help from another person bathing (including washing, rinsing, drying)?: A Little Help from another person to put on and taking off regular upper body clothing?: A Little Help from another person to put on and taking off regular lower body clothing?: A Little 6 Click Score: 18    End of Session Equipment Utilized During Treatment: Gait belt;Rolling walker  OT Visit Diagnosis: Unsteadiness on feet (R26.81);Muscle weakness (generalized) (M62.81);History of falling (Z91.81)   Activity Tolerance Patient tolerated treatment well   Patient Left in chair;with family/visitor present   Nurse  Communication Mobility status        Time: 8406-9861 OT Time Calculation (min): 18 min  Charges: OT General Charges $OT Visit: 1 Visit OT Treatments $Self Care/Home Management : 8-22 mins  Lise Auer, OT Acute Rehabilitation Services [phone] Office- [phone]      Vanity Larsson, Karin Golden D 09/04/2019, 2:42 PM

## 2019-09-04 NOTE — Plan of Care (Signed)

## 2019-09-05 LAB — CBC WITH DIFFERENTIAL/PLATELET
Abs Immature Granulocytes: 0.1 10*3/uL — ABNORMAL HIGH (ref 0.00–0.07)
Basophils Absolute: 0 10*3/uL (ref 0.0–0.1)
Basophils Relative: 0 %
Eosinophils Absolute: 0.2 10*3/uL (ref 0.0–0.5)
Eosinophils Relative: 1 %
HCT: 34.6 % — ABNORMAL LOW (ref 36.0–46.0)
Hemoglobin: 11.3 g/dL — ABNORMAL LOW (ref 12.0–15.0)
Immature Granulocytes: 1 %
Lymphocytes Relative: 7 %
Lymphs Abs: 1 10*3/uL (ref 0.7–4.0)
MCH: 29.6 pg (ref 26.0–34.0)
MCHC: 32.7 g/dL (ref 30.0–36.0)
MCV: 90.6 fL (ref 80.0–100.0)
Monocytes Absolute: 2 10*3/uL — ABNORMAL HIGH (ref 0.1–1.0)
Monocytes Relative: 15 %
Neutro Abs: 10.3 10*3/uL — ABNORMAL HIGH (ref 1.7–7.7)
Neutrophils Relative %: 76 %
Platelets: 180 10*3/uL (ref 150–400)
RBC: 3.82 MIL/uL — ABNORMAL LOW (ref 3.87–5.11)
RDW: 15.8 % — ABNORMAL HIGH (ref 11.5–15.5)
WBC: 13.5 10*3/uL — ABNORMAL HIGH (ref 4.0–10.5)
nRBC: 0 % (ref 0.0–0.2)

## 2019-09-05 LAB — COMPREHENSIVE METABOLIC PANEL
ALT: 36 U/L (ref 0–44)
AST: 48 U/L — ABNORMAL HIGH (ref 15–41)
Albumin: 2.3 g/dL — ABNORMAL LOW (ref 3.5–5.0)
Alkaline Phosphatase: 179 U/L — ABNORMAL HIGH (ref 38–126)
Anion gap: 9 (ref 5–15)
BUN: 6 mg/dL — ABNORMAL LOW (ref 8–23)
CO2: 23 mmol/L (ref 22–32)
Calcium: 7.3 mg/dL — ABNORMAL LOW (ref 8.9–10.3)
Chloride: 104 mmol/L (ref 98–111)
Creatinine, Ser: 0.75 mg/dL (ref 0.44–1.00)
GFR calc Af Amer: >60 mL/min (ref >60)
GFR calc non Af Amer: >60 mL/min (ref >60)
Glucose, Bld: 101 mg/dL — ABNORMAL HIGH (ref 70–99)
Potassium: 3.9 mmol/L (ref 3.5–5.1)
Sodium: 136 mmol/L (ref 135–145)
Total Bilirubin: 0.9 mg/dL (ref 0.3–1.2)
Total Protein: 4.9 g/dL — ABNORMAL LOW (ref 6.5–8.1)

## 2019-09-05 LAB — CULTURE, BLOOD (ROUTINE X 2)
Culture: NO GROWTH
Culture: NO GROWTH
Special Requests: ADEQUATE
Special Requests: ADEQUATE

## 2019-09-05 LAB — LIPASE, BLOOD: Lipase: 38 U/L (ref 11–51)

## 2019-09-05 MED ORDER — PROCHLORPERAZINE EDISYLATE 10 MG/2ML IJ SOLN
10.0000 mg | Freq: Once | INTRAMUSCULAR | Status: AC
  Administered 2019-09-05: 10 mg via INTRAVENOUS
  Filled 2019-09-05: qty 2

## 2019-09-05 MED ORDER — ENSURE ENLIVE PO LIQD
237.0000 mL | Freq: Two times a day (BID) | ORAL | Status: DC
  Administered 2019-09-05 – 2019-09-06 (×2): 237 mL via ORAL

## 2019-09-05 MED ORDER — POTASSIUM CHLORIDE 20 MEQ PO PACK
20.0000 meq | PACK | Freq: Once | ORAL | Status: DC
  Filled 2019-09-05: qty 1

## 2019-09-05 MED ORDER — METOPROLOL TARTRATE 12.5 MG HALF TABLET
12.5000 mg | ORAL_TABLET | Freq: Two times a day (BID) | ORAL | Status: DC
  Administered 2019-09-05 – 2019-09-06 (×3): 12.5 mg via ORAL
  Filled 2019-09-05 (×3): qty 1

## 2019-09-05 NOTE — Progress Notes (Addendum)
Physical Therapy Treatment Patient Details Name: Jessica Whitney MRN: [id number] DOB: 09-11-1945 Today's Date: 09/05/2019    History of Present Illness CHIFFON KITTLESON is a 74 y.o. female with medical history significant of  dementia, hypothyroidism, GERD, anxiety, fibromyalgia, post cholecystectomy who presented at [facility] with complaints of severe epigastric abdominal pain of 1 day duration.  Associated with nausea and vomiting. .  CT AP with contrast showed evidence of acute pancreatitis and possible duodenal ulcer with perforation.    PT Comments    Pt requesting restroom upon PT arrival; pt impulsively attempts to exit bed without shoes/socks, educated on importance on non-slip footwear and agrees to allow PT to place socks on pt. Pt impulsive, pushes RW out of way, ambulates to restroom and able to complete toileting with PT standing at doorway. Pt frustrated at phone ringing while washing hands causing her to impulsively stop washing hands, walk quickly back to bed and answer phone. After call completed, therapist offered exercises and ambulation but pt refuses. Therapist educated pt on safety with mobility and importance of strengthening, increasing endurance, and reducing risk for falls, but pt continues to pleasantly decline offer and prefers to wait for her spouse to arrive with her clothes. Patient will benefit from continued physical therapy in hospital and recommended venue below to increase strength, balance, endurance for safe ADLs and gait.   Follow Up Recommendations  Home health PT;Supervision/Assistance - 24 hour     Equipment Recommendations  None recommended by PT    Recommendations for Other Services       Precautions / Restrictions Precautions Precautions: Fall Precaution Comments: stll impulsive but less confused    Mobility  Bed Mobility Overal bed mobility: Needs Assistance       Supine to sit: Supervision Sit to supine: Supervision   General bed mobility  comments: verbal cues for attention to task and decreased speed, for safety with pads on floor  Transfers Overall transfer level: Needs assistance Equipment used: Rolling walker (2 wheeled);None Transfers: Sit to/from Stand Sit to Stand: Supervision         General transfer comment: attempted RW, but pt pushed out of way  Ambulation/Gait Ambulation/Gait assistance: Min guard Gait Distance (Feet): 25 Feet Assistive device: None Gait Pattern/deviations: Step-through pattern Gait velocity: decreased   General Gait Details: impulsive, pushes RW out of way and ambulates to restroom and around room without AD, no loss of balance, staggering or drifting noted, but passes obstacles too closely despite verbal cues   Stairs             Wheelchair Mobility    Modified Rankin (Stroke Patients Only)       Balance Overall balance assessment: Needs assistance Sitting-balance support: No upper extremity supported Sitting balance-Leahy Scale: Good     Standing balance support: During functional activity;No upper extremity supported Standing balance-Leahy Scale: Good Standing balance comment: short distances in room                            Cognition Arousal/Alertness: Awake/alert Behavior During Therapy: WFL for tasks assessed/performed Overall Cognitive Status: Impaired/Different from baseline Area of Impairment: Attention;Safety/judgement                   Current Attention Level: Selective     Safety/Judgement: Decreased awareness of safety     General Comments: Pt impulsive with therapy, attempted to stand without socks/shoes and not understanding importance. Pt rushing to complete  toileting, pushing RW out of way to walk quickly back to bed to answer phone.      Exercises      General Comments        Pertinent Vitals/Pain Pain Assessment: No/denies pain    Home Living                      Prior Function            PT  Goals (current goals can now be found in the care plan section) Progress towards PT goals: Progressing toward goals    Frequency    Min 3X/week      PT Plan Current plan remains appropriate    Co-evaluation              AM-PAC PT "6 Clicks" Mobility   Outcome Measure  Help needed turning from your back to your side while in a flat bed without using bedrails?: None Help needed moving from lying on your back to sitting on the side of a flat bed without using bedrails?: None Help needed moving to and from a bed to a chair (including a wheelchair)?: A Little Help needed standing up from a chair using your arms (e.g., wheelchair or bedside chair)?: A Little Help needed to walk in hospital room?: A Little Help needed climbing 3-5 steps with a railing? : A Little 6 Click Score: 20    End of Session Equipment Utilized During Treatment: Gait belt(telesitter) Activity Tolerance: Patient tolerated treatment well Patient left: in bed;with call bell/phone within reach;with bed alarm set Nurse Communication: Mobility status;Other (comment)(toileting) PT Visit Diagnosis: Unsteadiness on feet (R26.81)     Time: 4627-0350 PT Time Calculation (min) (ACUTE ONLY): 10 min  Charges:  $Therapeutic Activity: 8-22 mins                     Domenick Bookbinder PT, DPT 09/05/19, 12:58 PM [phone]

## 2019-09-05 NOTE — Progress Notes (Signed)
Acute gallstone pancreatitis with negative MRCP.  Continued biochemical improvement.  White count almost normal at 13,000, compared to 37,000 several days ago.  No real complaints of nausea or pain at this point, but just is not hungry and is eating very small amounts, perhaps a handful of food per day.  Antibiotics were stopped yesterday; the patient apparently had one loose stool earlier today, with incontinence.  Exam: No distress, sitting up in the window seat.  Unfortunately, has dementia.  Chest is clear at both bases, heart rate normal at about 75, bowel sounds are absent but the abdomen is soft and essentially completely nontender.  Impression: Main problem now seems to be anorexia or lack of interest in eating.  Plan:  1.  Continue observation off antibiotics  2.  Continue scheduled Zofran for now  3.  I think discharge home is medically safe at any time; the main concern at this time is whether the patient would improve her oral intake once she is in the home environment, taking into account her dementia problem.  I would feel better if she proved to [place] that she could at least tolerate a half sized meal before she goes home.  4.  Watch for further diarrhea.  Consider checking for C. difficile and/or using Florastor probiotic if diarrhea persists.    Dr. Marca Ancona is rounding for [place] this weekend.  Jessica Whitney, M.D. Pager [phone] If no answer or after 5 PM call [phone]

## 2019-09-05 NOTE — Progress Notes (Signed)
[facility]                                                                              Patient Demographics  Milon ScoreMarsha Whitney, is a 74 y.o. female, DOB - 1946/01/22, ZOX:[id number]  Admit date - 08/30/2019   Admitting Physician Mauricio Annett Gulaaniel Arrien, MD  Outpatient Primary MD for the patient is Jarrett SohoWharton, Courtney, PA-C  Outpatient specialists:   LOS - 6  days   Medical records reviewed and are as summarized below:    Chief Complaint  Patient presents with  . Abdominal Pain       Brief summary   Patient is a 74 year old female with dementia, hypothyroidism, GERD, anxiety, fibromyalgia, post cholecystectomy [facility] with severe epigastric abdominal pain of 1 day duration, nausea and vomiting.  Lipase greater than 1500.  CT scan showed acute pancreatitis and possible duodenal ulcer with perforation.  [facility].  GI consulted.   Assessment & Plan    Principal problem Sepsis secondary to pancreatitis, resolving gallstone pancreatitis -Patient presented with leukocytosis, tachycardia, severe epigastric abdominal pain, nausea and vomiting with evidence of acute pancreatitis -Sepsis physiology has resolved -MRCP did not show any evidence of gallstones, likely passed a stone.  GI was consulted. -Blood cultures negative till date.  DC IV antibiotics.   -Lipase is improving 38, was 1512 at the time of admission -Advanced diet to soft solids, discontinue IV fluids, leukocytosis improving  Hypokalemia Resolved  Less likely duodenal ulcer with possible perforation on CT scan Reported on CT AP w contrast done on admission. Last endoscopy 3 years ago, no evidence of ulcer, no use of NSAIDS.  No free air on CT. -DC IV antibiotics  Hypothyroidism.  Continue Synthroid  Cognitive impairment/dementia Continue donepezil, duloxetine  Back pain, chronic -Added Lidoderm patch, decreased morphine (per husband at the  bedside, has been getting confused with pain medication)  Essential hypertension, tachycardia -BP has been elevated, not on antihypertensives at home -Placed on Lopressor 12.5 mg twice daily   Code Status: Full CODE STATUS DVT Prophylaxis: lovenox  Family Communication: Discussed all imaging results, lab results, explained to the patient and and patient's husband on the phone  Disposition Plan: Patient from home, will likely discharge home in a.m. with home health.  PT evaluation done, recommending home health PT.  Discussed in detail with the patient's husband, will motivate her to eat today   Time Spent in minutes 25 minutes  Procedures:  None  Consultants:   Gastroenterology  Antimicrobials:   Anti-infectives (From admission, onward)   Start     Dose/Rate Route Frequency Ordered Stop   08/31/19 0600  cefTRIAXone (ROCEPHIN) 2 g in sodium chloride 0.9 % 100 mL IVPB  Status:  Discontinued     2 g 200 mL/hr over 30 Minutes Intravenous Every 24 hours 08/31/19 0556 09/04/19 1204   08/31/19 0600  metroNIDAZOLE (FLAGYL) IVPB 500 mg  Status:  Discontinued     500 mg 100 mL/hr over 60 Minutes Intravenous Every 8 hours 08/31/19 0556 09/04/19 1204         Medications  Scheduled  Meds: . donepezil  10 mg Oral QHS  . DULoxetine  80 mg Oral Daily  . enoxaparin (LOVENOX) injection  40 mg Subcutaneous Q24H  . levothyroxine  100 mcg Oral QAC breakfast  . lidocaine  1 patch Transdermal Daily  . ondansetron (ZOFRAN) IV  4 mg Intravenous Q8H  . pantoprazole (PROTONIX) IV  40 mg Intravenous Q24H  . potassium chloride  20 mEq Oral Once  . prochlorperazine  10 mg Intravenous Once   Continuous Infusions: . sodium chloride 50 mL/hr at 09/04/19 1802   PRN Meds:.acetaminophen **OR** acetaminophen, gadobutrol, haloperidol lactate, morphine injection      Subjective:   Jessica Whitney was seen and examined today.  Back pain better however complaining of nausea and does not feel like  eating.  No abdominal pain.  Afebrile.  No chest pain or shortness of breath.  Objective:   Vitals:   09/04/19 0555 09/04/19 1408 09/04/19 2159 09/05/19 0519  BP: 130/79 121/81 131/73 110/67  Pulse: (!) 106 (!) 104 99 97  Resp: 18 20 18 18   Temp: 97.9 F (36.6 C) 98 F (36.7 C) 99.3 F (37.4 C) 97.8 F (36.6 C)  TempSrc: Oral Oral Oral Oral  SpO2: 95% 97% 96% 96%  Weight:      Height:        Intake/Output Summary (Last 24 hours) at 09/05/2019 1256 Last data filed at 09/05/2019 0934 Gross per 24 hour  Intake 1026.63 ml  Output --  Net 1026.63 ml     Wt Readings from Last 3 Encounters:  08/30/19 58.5 kg  07/24/19 60.3 kg  04/21/19 61.2 kg   Physical Exam  General: Alert and oriented x self and place, pleasant and cooperative  Cardiovascular: S1 S2 clear, RRR. No pedal edema b/l  Respiratory: CTAB, no wheezing, rales or rhonchi  Gastrointestinal: Soft, nontender, nondistended  Ext: no pedal edema bilaterally  Neuro: no new deficits  Musculoskeletal: No cyanosis, clubbing  Skin: No rashes  Psych: Has underlying dementia      Data Reviewed:  I have personally reviewed following labs and imaging studies  Micro Results Recent Results (from the past 240 hour(s))  Respiratory Panel by RT PCR (Flu A&B, Covid) - Nasopharyngeal Swab     Status: None   Collection Time: 08/30/19  9:32 AM   Specimen: Nasopharyngeal Swab  Result Value Ref Range Status   SARS Coronavirus 2 by RT PCR NEGATIVE NEGATIVE Final    Comment: (NOTE) SARS-CoV-2 target nucleic acids are NOT DETECTED. The SARS-CoV-2 RNA is generally detectable in upper respiratoy specimens during the acute phase of infection. The lowest concentration of SARS-CoV-2 viral copies this assay can detect is 131 copies/mL. A negative result does not preclude SARS-Cov-2 infection and should not be used as the sole basis for treatment or other patient management decisions. A negative result may occur with   improper specimen collection/handling, submission of specimen other than nasopharyngeal swab, presence of viral mutation(s) within the areas targeted by this assay, and inadequate number of viral copies (<131 copies/mL). A negative result must be combined with clinical observations, patient history, and epidemiological information. The expected result is Negative. Fact Sheet for Patients:  10/30/19 Fact Sheet for Healthcare Providers:  https://www.moore.com/ This test is not yet ap proved or cleared by the https://www.young.biz/ FDA and  has been authorized for detection and/or diagnosis of SARS-CoV-2 by FDA under an Emergency Use Authorization (EUA). This EUA will remain  in effect (meaning this test can be used) for the  duration of the COVID-19 declaration under Section 564(b)(1) of the Act, 21 U.S.C. section 360bbb-3(b)(1), unless the authorization is terminated or revoked sooner.    Influenza A by PCR NEGATIVE NEGATIVE Final   Influenza B by PCR NEGATIVE NEGATIVE Final    Comment: (NOTE) The Xpert Xpress SARS-CoV-2/FLU/RSV assay is intended as an aid in  the diagnosis of influenza from Nasopharyngeal swab specimens and  should not be used as a sole basis for treatment. Nasal washings and  aspirates are unacceptable for Xpert Xpress SARS-CoV-2/FLU/RSV  testing. Fact Sheet for Patients: PinkCheek.be Fact Sheet for Healthcare Providers: GravelBags.it This test is not yet approved or cleared by the [place] FDA and  has been authorized for detection and/or diagnosis of SARS-CoV-2 by  FDA under an Emergency Use Authorization (EUA). This EUA will remain  in effect (meaning this test can be used) for the duration of the  Covid-19 declaration under Section 564(b)(1) of the Act, 21  U.S.C. section 360bbb-3(b)(1), unless the authorization is  terminated or revoked. Performed at  [facility], [address] [place], [place] [address]   Culture, Urine     Status: None   Collection Time: 08/31/19  5:48 AM   Specimen: Urine, Random  Result Value Ref Range Status   Specimen Description   Final    URINE, RANDOM Performed at [facility], [place] [address], [place], [place] [address]    Special Requests   Final    NONE Performed at [facility], [place] [address] [place], [place] [address]    Culture   Final    NO GROWTH Performed at [facility], [place] [address], [place], [place] [address]    Report Status 09/01/2019 FINAL  Final  Culture, blood (routine x 2)     Status: None   Collection Time: 08/31/19  6:04 AM   Specimen: BLOOD  Result Value Ref Range Status   Specimen Description   Final    BLOOD RIGHT HAND Performed at [place] [address], [place], [place] [address]    Special Requests   Final    BOTTLES DRAWN AEROBIC ONLY Blood Culture adequate volume Performed at [place] [address], [place], [place] [address]    Culture   Final    NO GROWTH 5 DAYS Performed at [facility], [place] [address] [place], [place] [address]    Report Status 09/05/2019 FINAL  Final  Culture, blood (routine x 2)     Status: None   Collection Time: 08/31/19  6:04 AM   Specimen: BLOOD  Result Value Ref Range Status   Specimen Description   Final    BLOOD RIGHT ASSIST CONTROL Performed at [place] [address] [place], [place] [address]    Special Requests   Final    BOTTLES DRAWN AEROBIC ONLY Blood Culture adequate volume Performed at [place] [address] [place], [place] [address]    Culture   Final    NO GROWTH 5 DAYS Performed at [facility], [place] [address], [place], [place] [address]    Report Status 09/05/2019 FINAL  Final    Radiology Reports CT ABDOMEN PELVIS W CONTRAST  Result  Date: 08/30/2019 CLINICAL DATA:  74 year old female with history of acute nonlocalized abdominal pain. EXAM: CT ABDOMEN AND PELVIS WITH CONTRAST TECHNIQUE: Multidetector CT imaging of the abdomen and pelvis was performed using the  standard protocol following bolus administration of intravenous contrast. CONTRAST:  OMNIPAQUE IOHEXOL 300 MG/ML  SOLN COMPARISON:  CT the abdomen and pelvis 09/15/2014. FINDINGS: Lower chest: Aortic atherosclerosis. Hepatobiliary: Diffuse low attenuation throughout the hepatic parenchyma, indicative of hepatic steatosis. No discrete cystic or solid hepatic lesions. No intra or extrahepatic biliary ductal dilatation. Status post cholecystectomy. Pancreas: Heterogeneous perfusion throughout the pancreas and diffuse surrounding peripancreatic inflammatory changes and small amount of peripancreatic fluid, compatible with acute pancreatitis. No well-defined peripancreatic fluid collections are identified at this time to suggest pancreatic pseudocyst. Spleen: Unremarkable. Adrenals/Urinary Tract: Subcentimeter low-attenuation lesions in both kidneys, too small to characterize, but statistically likely to represent tiny cysts. Bilateral adrenal glands are normal in appearance. No hydroureteronephrosis. Urinary bladder is normal in appearance. Stomach/Bowel: Normal appearance of the stomach. There are several areas of potential discontinuity in the wall of the duodenum involving second and third portions of the duodenum. This could be artifactual related to hypoperfusion in these areas of the wall from adjacent inflammation associated with the groove pancreatitis or could be related to duodenal ulcer disease with or without perforation. Surrounding fluid is noted in the retroperitoneum. No pathologic dilatation of small bowel or colon. Numerous colonic diverticulae are noted, without definite surrounding inflammatory changes to clearly indicate an acute diverticulitis at this time.  Vascular/Lymphatic: Aortic atherosclerosis, without evidence of aneurysm or dissection in the abdominal or pelvic vasculature. No lymphadenopathy noted in the abdomen or pelvis. Reproductive: 2 cm lesion in the anterior aspect of the uterine body, presumably a small fibroid. Ovaries are a trophic. Other: Fluid throughout the retroperitoneum and small bowel mesentery. Trace volume of ascites. No pneumoperitoneum. Musculoskeletal: There are no aggressive appearing lytic or blastic lesions noted in the visualized portions of the skeleton. Posterior fixation hardware at the L4-L5 level. IMPRESSION: 1. Extensive inflammatory changes around the pancreas, compatible with acute pancreatitis. This is most evident in the head of the pancreas, and there are potential areas of discontinuity in the wall of the duodenum involving the second and third portions of the duodenum. This may all simply be related to severe groove pancreatitis, however, further evaluation with endoscopy should be considered to exclude the possibility of perforated duodenal ulcer(s). 2. Hepatic steatosis. 3. Aortic atherosclerosis. 4. Additional incidental findings, as above. Critical Value/emergent results were called by telephone at the time of interpretation on 08/30/2019 at 10:36 am to provider [facility] , who verbally acknowledged these results. Electronically Signed   By: Trudie Reed M.D.   On: 08/30/2019 10:36   MR ABDOMEN MRCP WO CONTRAST  Result Date: 08/31/2019 CLINICAL DATA:  Acute pancreatitis. Severe epigastric abdominal pain. Nausea and vomiting. EXAM: MRI ABDOMEN WITHOUT CONTRAST  (INCLUDING MRCP) TECHNIQUE: Multiplanar multisequence MR imaging of the abdomen was performed. Heavily T2-weighted images of the biliary and pancreatic ducts were obtained, and three-dimensional MRCP images were rendered by post processing. COMPARISON:  CT on 08/30/2019 FINDINGS: Lower chest: Tiny bilateral pleural effusions and bibasilar atelectasis.  Hepatobiliary: No masses visualized on this unenhanced exam. Diffuse hepatic steatosis is demonstrated. Prior cholecystectomy. No evidence of biliary ductal dilatation, with common bile duct measuring 6 mm. No evidence of choledocholithiasis. Pancreas: Diffuse pancreatic edema and moderate peripancreatic inflammatory changes are again seen, consistent with severe acute pancreatitis. No evidence of pancreatic necrosis or mass on this unenhanced exam. Small amount of retroperitoneal and intraperitoneal fluid is seen throughout the abdomen, however no pseudocysts are identified. Spleen:  Within normal limits in size. Adrenals/Urinary tract: Small right renal cyst noted.  No evidence of nephrolithiasis or hydronephrosis. Stomach/Bowel: Visualized portion unremarkable. Vascular/Lymphatic: No pathologically enlarged lymph nodes identified. No evidence of abdominal aortic aneurysm. Other:  None. Musculoskeletal:  No suspicious bone lesions identified. IMPRESSION: 1. Severe acute pancreatitis. No evidence of pancreatic necrosis. 2. Small amount of retroperitoneal and intraperitoneal fluid throughout the abdomen. No pseudocysts identified. 3. Prior cholecystectomy. No evidence of biliary ductal dilatation or choledocholithiasis. 4. Diffuse hepatic steatosis. 5. Tiny bilateral pleural effusions and bibasilar atelectasis. Electronically Signed   By: Danae Orleans M.D.   On: 08/31/2019 18:23   MR 3D Recon At Scanner  Result Date: 08/31/2019 CLINICAL DATA:  Acute pancreatitis. Severe epigastric abdominal pain. Nausea and vomiting. EXAM: MRI ABDOMEN WITHOUT CONTRAST  (INCLUDING MRCP) TECHNIQUE: Multiplanar multisequence MR imaging of the abdomen was performed. Heavily T2-weighted images of the biliary and pancreatic ducts were obtained, and three-dimensional MRCP images were rendered by post processing. COMPARISON:  CT on 08/30/2019 FINDINGS: Lower chest: Tiny bilateral pleural effusions and bibasilar atelectasis. Hepatobiliary:  No masses visualized on this unenhanced exam. Diffuse hepatic steatosis is demonstrated. Prior cholecystectomy. No evidence of biliary ductal dilatation, with common bile duct measuring 6 mm. No evidence of choledocholithiasis. Pancreas: Diffuse pancreatic edema and moderate peripancreatic inflammatory changes are again seen, consistent with severe acute pancreatitis. No evidence of pancreatic necrosis or mass on this unenhanced exam. Small amount of retroperitoneal and intraperitoneal fluid is seen throughout the abdomen, however no pseudocysts are identified. Spleen:  Within normal limits in size. Adrenals/Urinary tract: Small right renal cyst noted. No evidence of nephrolithiasis or hydronephrosis. Stomach/Bowel: Visualized portion unremarkable. Vascular/Lymphatic: No pathologically enlarged lymph nodes identified. No evidence of abdominal aortic aneurysm. Other:  None. Musculoskeletal:  No suspicious bone lesions identified. IMPRESSION: 1. Severe acute pancreatitis. No evidence of pancreatic necrosis. 2. Small amount of retroperitoneal and intraperitoneal fluid throughout the abdomen. No pseudocysts identified. 3. Prior cholecystectomy. No evidence of biliary ductal dilatation or choledocholithiasis. 4. Diffuse hepatic steatosis. 5. Tiny bilateral pleural effusions and bibasilar atelectasis. Electronically Signed   By: Danae Orleans M.D.   On: 08/31/2019 18:23    Lab Data:  CBC: Recent Labs  Lab 09/01/19 0607 09/02/19 0639 09/03/19 0628 09/04/19 0543 09/05/19 0517  WBC 29.7* 20.0* 16.7* 15.2* 13.5*  NEUTROABS 26.8* 17.9* 14.0* 12.1* 10.3*  HGB 12.8 11.7* 11.2* 11.0* 11.3*  HCT 39.3 36.0 34.4* 33.9* 34.6*  MCV 91.4 91.8 91.0 91.1 90.6  PLT 211 180 168 167 180   Basic Metabolic Panel: Recent Labs  Lab 08/30/19 0814 08/31/19 0554 08/31/19 0608 08/31/19 6226 09/01/19 0607 09/02/19 0639 09/03/19 0628 09/04/19 0543 09/05/19 0517  NA   < >  --  135   < > 136 135 136 132* 136  K   < >  --   4.9   < > 4.0 3.4* 4.0 3.9 3.9  CL   < >  --  107   < > 106 105 106 104 104  CO2   < >  --  20*   < > 22 24 26 23 23   GLUCOSE   < >  --  123*   < > 99 110* 131* 110* 101*  BUN   < >  --  18   < > 18 11 6* 7* 6*  CREATININE   < >  --  0.93   < > 1.08* 0.84 0.78 0.85 0.75  CALCIUM   < >  --  8.0*   < > 7.7* 7.4* 7.3* 7.0* 7.3*  MG  --  2.1  --   --   --   --   --   --   --   PHOS  --   --  1.9*  --   --   --   --   --   --    < > = values in this interval not displayed.   GFR: Estimated Creatinine Clearance: 51.8 mL/min (by C-G formula based on SCr of 0.75 mg/dL). Liver Function Tests: Recent Labs  Lab 09/01/19 0607 09/02/19 0639 09/03/19 0628 09/04/19 0543 09/05/19 0517  AST 140* 70* 37 51* 48*  ALT 125* 80* 52* 42 36  ALKPHOS 61 68 86 146* 179*  BILITOT 0.8 0.7 0.9 0.7 0.9  PROT 5.1* 5.1* 4.9* 5.0* 4.9*  ALBUMIN 2.6* 2.6* 2.4* 2.5* 2.3*   Recent Labs  Lab 09/01/19 0607 09/02/19 0639 09/03/19 0628 09/04/19 0543 09/05/19 0517  LIPASE 171* 58* 40 38 38   No results for input(s): AMMONIA in the last 168 hours. Coagulation Profile: No results for input(s): INR, PROTIME in the last 168 hours. Cardiac Enzymes: No results for input(s): CKTOTAL, CKMB, CKMBINDEX, TROPONINI in the last 168 hours. BNP (last 3 results) No results for input(s): PROBNP in the last 8760 hours. HbA1C: No results for input(s): HGBA1C in the last 72 hours. CBG: No results for input(s): GLUCAP in the last 168 hours. Lipid Profile: No results for input(s): CHOL, HDL, LDLCALC, TRIG, CHOLHDL, LDLDIRECT in the last 72 hours. Thyroid Function Tests: No results for input(s): TSH, T4TOTAL, FREET4, T3FREE, THYROIDAB in the last 72 hours. Anemia Panel: No results for input(s): VITAMINB12, FOLATE, FERRITIN, TIBC, IRON, RETICCTPCT in the last 72 hours. Urine analysis:    Component Value Date/Time   COLORURINE YELLOW 08/31/2019 0548   APPEARANCEUR CLEAR 08/31/2019 0548   LABSPEC 1.043 (H) 08/31/2019 0548    PHURINE 6.0 08/31/2019 0548   GLUCOSEU NEGATIVE 08/31/2019 0548   HGBUR MODERATE (A) 08/31/2019 0548   BILIRUBINUR NEGATIVE 08/31/2019 0548   KETONESUR NEGATIVE 08/31/2019 0548   PROTEINUR 30 (A) 08/31/2019 0548   UROBILINOGEN 0.2 09/14/2014 2105   NITRITE NEGATIVE 08/31/2019 0548   LEUKOCYTESUR NEGATIVE 08/31/2019 0548     Drenda Sobecki M.D. [facility] 09/05/2019, 12:56 PM   Call night coverage person covering after 7pm

## 2019-09-06 LAB — BASIC METABOLIC PANEL
Anion gap: 8 (ref 5–15)
BUN: 6 mg/dL — ABNORMAL LOW (ref 8–23)
CO2: 23 mmol/L (ref 22–32)
Calcium: 7.4 mg/dL — ABNORMAL LOW (ref 8.9–10.3)
Chloride: 103 mmol/L (ref 98–111)
Creatinine, Ser: 0.71 mg/dL (ref 0.44–1.00)
GFR calc Af Amer: >60 mL/min (ref >60)
GFR calc non Af Amer: >60 mL/min (ref >60)
Glucose, Bld: 109 mg/dL — ABNORMAL HIGH (ref 70–99)
Potassium: 4.2 mmol/L (ref 3.5–5.1)
Sodium: 134 mmol/L — ABNORMAL LOW (ref 135–145)

## 2019-09-06 LAB — CBC
HCT: 34.1 % — ABNORMAL LOW (ref 36.0–46.0)
Hemoglobin: 11.2 g/dL — ABNORMAL LOW (ref 12.0–15.0)
MCH: 29.1 pg (ref 26.0–34.0)
MCHC: 32.8 g/dL (ref 30.0–36.0)
MCV: 88.6 fL (ref 80.0–100.0)
Platelets: 242 10*3/uL (ref 150–400)
RBC: 3.85 MIL/uL — ABNORMAL LOW (ref 3.87–5.11)
RDW: 15.9 % — ABNORMAL HIGH (ref 11.5–15.5)
WBC: 17.8 10*3/uL — ABNORMAL HIGH (ref 4.0–10.5)
nRBC: 0 % (ref 0.0–0.2)

## 2019-09-06 MED ORDER — ONDANSETRON 4 MG PO TBDP: 4.0000 mg | ORAL_TABLET | Freq: Three times a day (TID) | ORAL | 0 refills | Status: DC | PRN

## 2019-09-06 MED ORDER — LIDOCAINE 5 % EX PTCH: 1.0000 | MEDICATED_PATCH | Freq: Every day | CUTANEOUS | 0 refills | Status: DC

## 2019-09-06 MED ORDER — METOPROLOL TARTRATE 25 MG PO TABS: 12.5000 mg | ORAL_TABLET | Freq: Two times a day (BID) | ORAL | 2 refills | Status: DC

## 2019-09-06 MED ORDER — DONEPEZIL HCL 10 MG PO TABS: 10.0000 mg | ORAL_TABLET | Freq: Every day | ORAL | Status: DC

## 2019-09-06 NOTE — Progress Notes (Signed)
Subjective: The patient was seen and examined at bedside.  Patient states she has been eating little but without nausea or vomiting.  She states she had a formed bowel movement today. She is requesting to go home.  Objective: Vital signs in last 24 hours: Temp:  [98.2 F (36.8 C)-99.1 F (37.3 C)] 98.2 F (36.8 C) (03/13 0533) Pulse Rate:  [85-92] 92 (03/13 0533) Resp:  [18] 18 (03/13 0533) BP: (107-132)/(68-77) 130/69 (03/13 0533) SpO2:  [96 %-99 %] 96 % (03/13 0533) Weight change:  Last BM Date: 09/01/19  PE: Frail appearing, no pallor, no icterus GENERAL: Not in distress, alert, able to tell me the right month and the place correctly ABDOMEN: Mild epigastric tenderness, otherwise soft with normoactive bowel sounds, no rigidity, no guarding EXTREMITIES: No deformity  Lab Results: Results for orders placed or performed during the hospital encounter of 08/30/19 (from the past 48 hour(s))  Lipase, blood     Status: None   Collection Time: 09/05/19  5:17 AM  Result Value Ref Range   Lipase 38 11 - 51 U/L    Comment: Performed at [facility], [address] [place], [place] [address]  Comprehensive metabolic panel     Status: Abnormal   Collection Time: 09/05/19  5:17 AM  Result Value Ref Range   Sodium 136 135 - 145 mmol/L   Potassium 3.9 3.5 - 5.1 mmol/L   Chloride 104 98 - 111 mmol/L   CO2 23 22 - 32 mmol/L   Glucose, Bld 101 (H) 70 - 99 mg/dL    Comment: Glucose reference range applies only to samples taken after fasting for at least 8 hours.   BUN 6 (L) 8 - 23 mg/dL   Creatinine, Ser 4.69 0.44 - 1.00 mg/dL   Calcium 7.3 (L) 8.9 - 10.3 mg/dL   Total Protein 4.9 (L) 6.5 - 8.1 g/dL   Albumin 2.3 (L) 3.5 - 5.0 g/dL   AST 48 (H) 15 - 41 U/L   ALT 36 0 - 44 U/L   Alkaline Phosphatase 179 (H) 38 - 126 U/L   Total Bilirubin 0.9 0.3 - 1.2 mg/dL   GFR calc non Af Amer >60 >60 mL/min   GFR calc Af Amer >60 >60 mL/min   Anion gap 9 5 - 15    Comment:  Performed at [facility], [address], [place], [place] [address]  CBC with Differential/Platelet     Status: Abnormal   Collection Time: 09/05/19  5:17 AM  Result Value Ref Range   WBC 13.5 (H) 4.0 - 10.5 K/uL   RBC 3.82 (L) 3.87 - 5.11 MIL/uL   Hemoglobin 11.3 (L) 12.0 - 15.0 g/dL   HCT 84.1 (L) 32.4 - 40.1 %   MCV 90.6 80.0 - 100.0 fL   MCH 29.6 26.0 - 34.0 pg   MCHC 32.7 30.0 - 36.0 g/dL   RDW 02.7 (H) 25.3 - 66.4 %   Platelets 180 150 - 400 K/uL   nRBC 0.0 0.0 - 0.2 %   Neutrophils Relative % 76 %   Neutro Abs 10.3 (H) 1.7 - 7.7 K/uL   Lymphocytes Relative 7 %   Lymphs Abs 1.0 0.7 - 4.0 K/uL   Monocytes Relative 15 %   Monocytes Absolute 2.0 (H) 0.1 - 1.0 K/uL   Eosinophils Relative 1 %   Eosinophils Absolute 0.2 0.0 - 0.5 K/uL   Basophils Relative 0 %   Basophils Absolute 0.0 0.0 - 0.1 K/uL  Immature Granulocytes 1 %   Abs Immature Granulocytes 0.10 (H) 0.00 - 0.07 K/uL    Comment: Performed at [facility] [address], [place], [place] [address]  Basic metabolic panel     Status: Abnormal   Collection Time: 09/06/19  4:44 AM  Result Value Ref Range   Sodium 134 (L) 135 - 145 mmol/L   Potassium 4.2 3.5 - 5.1 mmol/L   Chloride 103 98 - 111 mmol/L   CO2 23 22 - 32 mmol/L   Glucose, Bld 109 (H) 70 - 99 mg/dL    Comment: Glucose reference range applies only to samples taken after fasting for at least 8 hours.   BUN 6 (L) 8 - 23 mg/dL   Creatinine, Ser 0.71 0.44 - 1.00 mg/dL   Calcium 7.4 (L) 8.9 - 10.3 mg/dL   GFR calc non Af Amer >60 >60 mL/min   GFR calc Af Amer >60 >60 mL/min   Anion gap 8 5 - 15    Comment: Performed at [facility] [place] [address], [place], [place] [address]  CBC     Status: Abnormal   Collection Time: 09/06/19  4:44 AM  Result Value Ref Range   WBC 17.8 (H) 4.0 - 10.5 K/uL   RBC 3.85 (L) 3.87 - 5.11 MIL/uL   Hemoglobin 11.2 (L) 12.0 - 15.0 g/dL   HCT 34.1 (L) 36.0 - 46.0 %    MCV 88.6 80.0 - 100.0 fL   MCH 29.1 26.0 - 34.0 pg   MCHC 32.8 30.0 - 36.0 g/dL   RDW 15.9 (H) 11.5 - 15.5 %   Platelets 242 150 - 400 K/uL   nRBC 0.0 0.0 - 0.2 %    Comment: Performed at [facility], [place] [address], [place], [place] [address]    Studies/Results: No results found.  Medications: I have reviewed the patient's current medications.  Assessment: Acute pancreatitis-abnormal CAT scan from 08/30/2019, postcholecystectomy with no intrahepatic or extrahepatic biliary ductal dilatation MRCP from 08/31/2019 showed severe acute pancreatitis without evidence of necrosis and no pseudocysts Hepatic steatosis Elevated WBC count Normocytic anemia Normal renal function Normal LFTs  Plan: Tolerating soft diet, antibiotics have been discontinued, no diarrhea reported Although possibility of duodenal ulcer was noted on CAT scan, MRI showed normal bowels, however would recommend discharging home on PPI once a day for at least 4 weeks. Okay to DC home from GI standpoint.  Ronnette Juniper, MD 09/06/2019, 8:35 AM

## 2019-09-06 NOTE — Discharge Summary (Signed)
Physician Discharge Summary   Patient ID: Jessica GingerMarsha C Mcmichael MRN: [id number] DOB/AGE: 74-Nov-1947 74 y.o.  Admit date: 08/30/2019 Discharge date: 09/06/2019  Primary Care Physician:  Jarrett SohoWharton, Courtney, PA-C   Recommendations for Outpatient Follow-up:  1. Follow up with PCP in 1-2 weeks 2. Follow-up with Dr. Tristan SchroederPiccini in 2 weeks  Home Health: Home health PT OT Equipment/Devices:   Discharge Condition: stable  CODE STATUS: FULL Diet recommendation: Heart healthy soft diet   Discharge Diagnoses:     Sepsis secondary to pancreatitis Hypokalemia Hypothyroidism Mild dementia Chronic back pain Essential hypertension   Consults: Gastroenterology Dr. Matthias HughsBuccini    Allergies:   Allergies  Allergen Reactions  . Sulfa Antibiotics Other (See Comments)    Caused mouth sores     DISCHARGE MEDICATIONS: Allergies as of 09/06/2019      Reactions   Sulfa Antibiotics Other (See Comments)   Caused mouth sores      Medication List    STOP taking these medications   doxycycline 100 MG tablet Commonly known as: VIBRA-TABS     TAKE these medications   donepezil 10 MG tablet Commonly known as: ARICEPT Take 1 tablet (10 mg total) by mouth at bedtime.   DULoxetine HCl 40 MG Cpep Take 1 tablet by mouth in the morning and at bedtime. 2 tablets per day   levothyroxine 100 MCG tablet Commonly known as: SYNTHROID Take 100 mcg by mouth daily before breakfast.   lidocaine 5 % Commonly known as: LIDODERM Place 1 patch onto the skin daily. Remove & Discard patch within 12 hours or as directed by MD. Lower back Start taking on: September 07, 2019   metoprolol tartrate 25 MG tablet Commonly known as: LOPRESSOR Take 0.5 tablets (12.5 mg total) by mouth 2 (two) times daily.   ondansetron 4 MG disintegrating tablet Commonly known as: Zofran ODT Take 1 tablet (4 mg total) by mouth every 8 (eight) hours as needed for nausea or vomiting.   pantoprazole 40 MG tablet Commonly known as:  PROTONIX Take 40 mg by mouth every morning.   vitamin B-12 1000 MCG tablet Commonly known as: CYANOCOBALAMIN Take 1,000 mcg by mouth daily.        Brief H and P: For complete details please refer to admission H and P, but in brief  Patient is a 74 year old female with dementia, hypothyroidism, GERD, anxiety, fibromyalgia, post cholecystectomy [facility] with severe epigastric abdominal pain of 1 day duration, nausea and vomiting.  Lipase greater than 1500.  CT scan showed acute pancreatitis and possible duodenal ulcer with perforation.  [facility].  GI was consulted and patient was admitted for further work-up.   Hospital Course:   Sepsis secondary to pancreatitis, possible gallstone pancreatitis -Patient presented with leukocytosis, tachycardia, severe epigastric abdominal pain, nausea and vomiting with evidence of acute pancreatitis -Sepsis physiology has resolved -MRCP did not show any evidence of gallstones, likely passed a stone. -CT abdomen pelvis reported extensive inflammatory changes around the pancreas, duodenal ulcer with possible perforation.  GI was consulted, felt less likely to be any perforation or duodenal ulcer. Last endoscopy 3 years ago, no evidence of ulcer, no use of NSAIDS.  No free air on CT. antibiotics were discontinued. -DC IV antibioticsonsulted. -Blood cultures negative till date.  Antibiotics were discontinued. -Lipase is improving 38 at discharge, was 1512 at the time of admission -Tolerating solid diet without any difficulty.  No acute issues overnight, no fever chills worsening abdominal  pain. - Patient was seen by GI, was cleared to be discharged home.  Outpatient follow-up with Dr. Matthias Hughs  Hypokalemia Potassium was replaced   Hypothyroidism.  Continue Synthroid  Cognitive impairment/dementia Continue donepezil, duloxetine  Back pain, chronic -Added Lidoderm patch  Essential  hypertension, tachycardia -BP has been elevated, not on antihypertensives at home -Placed on Lopressor 12.5 mg twice daily    Day of Discharge S: No acute complaints, no fever chills, vomiting or worsening abdominal pain.  Does not like the food.  Hoping to go home today.  BP 130/69 (BP Location: Left Arm)   Pulse 92   Temp 98.2 F (36.8 C) (Oral)   Resp 18   Ht 5\' 3"  (1.6 m)   Wt 58.5 kg   SpO2 96%   BMI 22.85 kg/m   Physical Exam: General: Alert and awake oriented x3 not in any acute distress. HEENT: anicteric sclera, pupils reactive to light and accommodation CVS: S1-S2 clear no murmur rubs or gallops Chest: clear to auscultation bilaterally, no wheezing rales or rhonchi Abdomen: soft nontender, nondistended, normal bowel sounds Extremities: no cyanosis, clubbing or edema noted bilaterally Neuro: Cranial nerves II-XII intact, no focal neurological deficits    Get Medicines reviewed and adjusted: Please take all your medications with you for your next visit with your Primary MD  Please request your Primary MD to go over all hospital tests and procedure/radiological results at the follow up. Please ask your Primary MD to get all Hospital records sent to his/her office.  If you experience worsening of your admission symptoms, develop shortness of breath, life threatening emergency, suicidal or homicidal thoughts you must seek medical attention immediately by calling 911 or calling your MD immediately  if symptoms less severe.  You must read complete instructions/literature along with all the possible adverse reactions/side effects for all the Medicines you take and that have been prescribed to you. Take any new Medicines after you have completely understood and accept all the possible adverse reactions/side effects.   Do not drive when taking pain medications.   Do not take more than prescribed Pain, Sleep and Anxiety Medications  Special Instructions: If you have smoked  or chewed Tobacco  in the last 2 yrs please stop smoking, stop any regular Alcohol  and or any Recreational drug use.  Wear Seat belts while driving.  Please note  You were cared for by a hospitalist during your hospital stay. Once you are discharged, your primary care physician will handle any further medical issues. Please note that NO REFILLS for any discharge medications will be authorized once you are discharged, as it is imperative that you return to your primary care physician (or establish a relationship with a primary care physician if you do not have one) for your aftercare needs so that they can reassess your need for medications and monitor your lab values.   The results of significant diagnostics from this hospitalization (including imaging, microbiology, ancillary and laboratory) are listed below for reference.      Procedures/Studies:  CT ABDOMEN PELVIS W CONTRAST  Result Date: 08/30/2019 CLINICAL DATA:  74 year old female with history of acute nonlocalized abdominal pain. EXAM: CT ABDOMEN AND PELVIS WITH CONTRAST TECHNIQUE: Multidetector CT imaging of the abdomen and pelvis was performed using the standard protocol following bolus administration of intravenous contrast. CONTRAST:  65 OMNIPAQUE IOHEXOL 300 MG/ML  SOLN COMPARISON:  CT the abdomen and pelvis 09/15/2014. FINDINGS: Lower chest: Aortic atherosclerosis. Hepatobiliary: Diffuse low attenuation throughout the hepatic parenchyma, indicative  of hepatic steatosis. No discrete cystic or solid hepatic lesions. No intra or extrahepatic biliary ductal dilatation. Status post cholecystectomy. Pancreas: Heterogeneous perfusion throughout the pancreas and diffuse surrounding peripancreatic inflammatory changes and small amount of peripancreatic fluid, compatible with acute pancreatitis. No well-defined peripancreatic fluid collections are identified at this time to suggest pancreatic pseudocyst. Spleen: Unremarkable. Adrenals/Urinary  Tract: Subcentimeter low-attenuation lesions in both kidneys, too small to characterize, but statistically likely to represent tiny cysts. Bilateral adrenal glands are normal in appearance. No hydroureteronephrosis. Urinary bladder is normal in appearance. Stomach/Bowel: Normal appearance of the stomach. There are several areas of potential discontinuity in the wall of the duodenum involving second and third portions of the duodenum. This could be artifactual related to hypoperfusion in these areas of the wall from adjacent inflammation associated with the groove pancreatitis or could be related to duodenal ulcer disease with or without perforation. Surrounding fluid is noted in the retroperitoneum. No pathologic dilatation of small bowel or colon. Numerous colonic diverticulae are noted, without definite surrounding inflammatory changes to clearly indicate an acute diverticulitis at this time. Vascular/Lymphatic: Aortic atherosclerosis, without evidence of aneurysm or dissection in the abdominal or pelvic vasculature. No lymphadenopathy noted in the abdomen or pelvis. Reproductive: 2 cm lesion in the anterior aspect of the uterine body, presumably a small fibroid. Ovaries are a trophic. Other: Fluid throughout the retroperitoneum and small bowel mesentery. Trace volume of ascites. No pneumoperitoneum. Musculoskeletal: There are no aggressive appearing lytic or blastic lesions noted in the visualized portions of the skeleton. Posterior fixation hardware at the L4-L5 level. IMPRESSION: 1. Extensive inflammatory changes around the pancreas, compatible with acute pancreatitis. This is most evident in the head of the pancreas, and there are potential areas of discontinuity in the wall of the duodenum involving the second and third portions of the duodenum. This may all simply be related to severe groove pancreatitis, however, further evaluation with endoscopy should be considered to exclude the possibility of perforated  duodenal ulcer(s). 2. Hepatic steatosis. 3. Aortic atherosclerosis. 4. Additional incidental findings, as above. Critical Value/emergent results were called by telephone at the time of interpretation on 08/30/2019 at 10:36 am to provider [facility] , who verbally acknowledged these results. Electronically Signed   By: Trudie Reed M.D.   On: 08/30/2019 10:36   MR ABDOMEN MRCP WO CONTRAST  Result Date: 08/31/2019 CLINICAL DATA:  Acute pancreatitis. Severe epigastric abdominal pain. Nausea and vomiting. EXAM: MRI ABDOMEN WITHOUT CONTRAST  (INCLUDING MRCP) TECHNIQUE: Multiplanar multisequence MR imaging of the abdomen was performed. Heavily T2-weighted images of the biliary and pancreatic ducts were obtained, and three-dimensional MRCP images were rendered by post processing. COMPARISON:  CT on 08/30/2019 FINDINGS: Lower chest: Tiny bilateral pleural effusions and bibasilar atelectasis. Hepatobiliary: No masses visualized on this unenhanced exam. Diffuse hepatic steatosis is demonstrated. Prior cholecystectomy. No evidence of biliary ductal dilatation, with common bile duct measuring 6 mm. No evidence of choledocholithiasis. Pancreas: Diffuse pancreatic edema and moderate peripancreatic inflammatory changes are again seen, consistent with severe acute pancreatitis. No evidence of pancreatic necrosis or mass on this unenhanced exam. Small amount of retroperitoneal and intraperitoneal fluid is seen throughout the abdomen, however no pseudocysts are identified. Spleen:  Within normal limits in size. Adrenals/Urinary tract: Small right renal cyst noted. No evidence of nephrolithiasis or hydronephrosis. Stomach/Bowel: Visualized portion unremarkable. Vascular/Lymphatic: No pathologically enlarged lymph nodes identified. No evidence of abdominal aortic aneurysm. Other:  None. Musculoskeletal:  No suspicious bone lesions identified. IMPRESSION: 1. Severe acute pancreatitis. No  evidence of pancreatic necrosis. 2.  Small amount of retroperitoneal and intraperitoneal fluid throughout the abdomen. No pseudocysts identified. 3. Prior cholecystectomy. No evidence of biliary ductal dilatation or choledocholithiasis. 4. Diffuse hepatic steatosis. 5. Tiny bilateral pleural effusions and bibasilar atelectasis. Electronically Signed   By: Marlaine Hind M.D.   On: 08/31/2019 18:23   MR 3D Recon At Scanner  Result Date: 08/31/2019 CLINICAL DATA:  Acute pancreatitis. Severe epigastric abdominal pain. Nausea and vomiting. EXAM: MRI ABDOMEN WITHOUT CONTRAST  (INCLUDING MRCP) TECHNIQUE: Multiplanar multisequence MR imaging of the abdomen was performed. Heavily T2-weighted images of the biliary and pancreatic ducts were obtained, and three-dimensional MRCP images were rendered by post processing. COMPARISON:  CT on 08/30/2019 FINDINGS: Lower chest: Tiny bilateral pleural effusions and bibasilar atelectasis. Hepatobiliary: No masses visualized on this unenhanced exam. Diffuse hepatic steatosis is demonstrated. Prior cholecystectomy. No evidence of biliary ductal dilatation, with common bile duct measuring 6 mm. No evidence of choledocholithiasis. Pancreas: Diffuse pancreatic edema and moderate peripancreatic inflammatory changes are again seen, consistent with severe acute pancreatitis. No evidence of pancreatic necrosis or mass on this unenhanced exam. Small amount of retroperitoneal and intraperitoneal fluid is seen throughout the abdomen, however no pseudocysts are identified. Spleen:  Within normal limits in size. Adrenals/Urinary tract: Small right renal cyst noted. No evidence of nephrolithiasis or hydronephrosis. Stomach/Bowel: Visualized portion unremarkable. Vascular/Lymphatic: No pathologically enlarged lymph nodes identified. No evidence of abdominal aortic aneurysm. Other:  None. Musculoskeletal:  No suspicious bone lesions identified. IMPRESSION: 1. Severe acute pancreatitis. No evidence of pancreatic necrosis. 2. Small amount  of retroperitoneal and intraperitoneal fluid throughout the abdomen. No pseudocysts identified. 3. Prior cholecystectomy. No evidence of biliary ductal dilatation or choledocholithiasis. 4. Diffuse hepatic steatosis. 5. Tiny bilateral pleural effusions and bibasilar atelectasis. Electronically Signed   By: Marlaine Hind M.D.   On: 08/31/2019 18:23       LAB RESULTS: Basic Metabolic Panel: Recent Labs  Lab 08/30/19 1604 08/31/19 0554 08/31/19 0608 09/01/19 0607 09/05/19 0517 09/06/19 0444  NA  --   --  135   < > 136 134*  K  --   --  4.9   < > 3.9 4.2  CL  --   --  107   < > 104 103  CO2  --   --  20*   < > 23 23  GLUCOSE  --   --  123*   < > 101* 109*  BUN  --   --  18   < > 6* 6*  CREATININE   < >  --  0.93   < > 0.75 0.71  CALCIUM  --   --  8.0*   < > 7.3* 7.4*  MG  --  2.1  --   --   --   --   PHOS  --   --  1.9*  --   --   --    < > = values in this interval not displayed.   Liver Function Tests: Recent Labs  Lab 09/04/19 0543 09/05/19 0517  AST 51* 48*  ALT 42 36  ALKPHOS 146* 179*  BILITOT 0.7 0.9  PROT 5.0* 4.9*  ALBUMIN 2.5* 2.3*   Recent Labs  Lab 09/04/19 0543 09/05/19 0517  LIPASE 38 38   No results for input(s): AMMONIA in the last 168 hours. CBC: Recent Labs  Lab 09/05/19 0517 09/05/19 0517 09/06/19 0444  WBC 13.5*  --  17.8*  NEUTROABS 10.3*  --   --  HGB 11.3*  --  11.2*  HCT 34.6*  --  34.1*  MCV 90.6   < > 88.6  PLT 180  --  242   < > = values in this interval not displayed.   Cardiac Enzymes: No results for input(s): CKTOTAL, CKMB, CKMBINDEX, TROPONINI in the last 168 hours. BNP: Invalid input(s): POCBNP CBG: No results for input(s): GLUCAP in the last 168 hours.     Disposition and Follow-up: Discharge Instructions    Diet - low sodium heart healthy   Complete by: As directed    Discharge instructions   Complete by: As directed    SOFT diet.   Increase activity slowly   Complete by: As directed        DISPOSITION:  Home  DISCHARGE FOLLOW-UP Follow-up Information    [facility] Follow up.   Specialty: Home Health Services Why: agency will provide home health physical therapy. agency will call you to schedule first visit. Contact information: [address] [place] [address] [phone]        Jarrett Soho, PA-C. Schedule an appointment as soon as possible for a visit in 2 week(s).   Specialty: Family Medicine Contact information: [address] [place] [address] [phone]        Bernette Redbird, MD. Schedule an appointment as soon as possible for a visit in 2 week(s).   Specialty: Gastroenterology Contact information: [address] [place] [address] [phone]            Time coordinating discharge:  35 minutes  Signed:   Thad Ranger M.D. [facility] 09/06/2019, 10:38 AM

## 2019-09-06 NOTE — Progress Notes (Signed)
Nsg Discharge Note  Admit Date:  08/30/2019 Discharge date: 09/06/2019   Jessica Whitney to be D/C'd Home per MD order.  AVS completed.  Copy for chart, and copy for patient signed, and dated. Patient/caregiver able to verbalize understanding.  Discharge Medication: Allergies as of 09/06/2019      Reactions   Sulfa Antibiotics Other (See Comments)   Caused mouth sores      Medication List    STOP taking these medications   doxycycline 100 MG tablet Commonly known as: VIBRA-TABS     TAKE these medications   donepezil 10 MG tablet Commonly known as: ARICEPT Take 1 tablet (10 mg total) by mouth at bedtime.   DULoxetine HCl 40 MG Cpep Take 1 tablet by mouth in the morning and at bedtime. 2 tablets per day   levothyroxine 100 MCG tablet Commonly known as: SYNTHROID Take 100 mcg by mouth daily before breakfast.   lidocaine 5 % Commonly known as: LIDODERM Place 1 patch onto the skin daily. Remove & Discard patch within 12 hours or as directed by MD. Lower back Start taking on: September 07, 2019   metoprolol tartrate 25 MG tablet Commonly known as: LOPRESSOR Take 0.5 tablets (12.5 mg total) by mouth 2 (two) times daily.   ondansetron 4 MG disintegrating tablet Commonly known as: Zofran ODT Take 1 tablet (4 mg total) by mouth every 8 (eight) hours as needed for nausea or vomiting.   pantoprazole 40 MG tablet Commonly known as: PROTONIX Take 40 mg by mouth every morning.   vitamin B-12 1000 MCG tablet Commonly known as: CYANOCOBALAMIN Take 1,000 mcg by mouth daily.       Discharge Assessment: Vitals:   09/05/19 2155 09/06/19 0533  BP: 122/73 130/69  Pulse: 91 92  Resp:  18  Temp:  98.2 F (36.8 C)  SpO2:  96%   Skin clean, dry and intact without evidence of skin break down, no evidence of skin tears noted. IV catheter discontinued intact. Site without signs and symptoms of complications - no redness or edema noted at insertion site, patient denies c/o pain - only  slight tenderness at site.  Dressing with slight pressure applied.  D/c Instructions-Education: Discharge instructions given to patient/family with verbalized understanding. D/c education completed with patient/family including follow up instructions, medication list, d/c activities limitations if indicated, with other d/c instructions as indicated by MD - patient able to verbalize understanding, all questions fully answered. Patient instructed to return to ED, call 911, or call MD for any changes in condition.  Patient escorted via WC, and D/C home via private auto.  Robert Bellow, RN 09/06/2019 10:26 AM

## 2019-09-09 DIAGNOSIS — E860 Dehydration: Secondary | ICD-10-CM | POA: Diagnosis not present

## 2019-09-09 DIAGNOSIS — G8929 Other chronic pain: Secondary | ICD-10-CM | POA: Diagnosis not present

## 2019-09-09 DIAGNOSIS — I951 Orthostatic hypotension: Secondary | ICD-10-CM | POA: Diagnosis not present

## 2019-09-09 DIAGNOSIS — M797 Fibromyalgia: Secondary | ICD-10-CM | POA: Diagnosis not present

## 2019-09-09 DIAGNOSIS — K859 Acute pancreatitis without necrosis or infection, unspecified: Secondary | ICD-10-CM | POA: Diagnosis not present

## 2019-09-09 DIAGNOSIS — I10 Essential (primary) hypertension: Secondary | ICD-10-CM | POA: Diagnosis not present

## 2019-09-09 DIAGNOSIS — F0390 Unspecified dementia without behavioral disturbance: Secondary | ICD-10-CM | POA: Diagnosis not present

## 2019-09-09 DIAGNOSIS — F419 Anxiety disorder, unspecified: Secondary | ICD-10-CM | POA: Diagnosis not present

## 2019-09-09 DIAGNOSIS — E876 Hypokalemia: Secondary | ICD-10-CM | POA: Diagnosis not present

## 2019-09-09 DIAGNOSIS — M479 Spondylosis, unspecified: Secondary | ICD-10-CM | POA: Diagnosis not present

## 2019-09-10 DIAGNOSIS — F0390 Unspecified dementia without behavioral disturbance: Secondary | ICD-10-CM | POA: Diagnosis not present

## 2019-09-10 DIAGNOSIS — G8929 Other chronic pain: Secondary | ICD-10-CM | POA: Diagnosis not present

## 2019-09-10 DIAGNOSIS — I951 Orthostatic hypotension: Secondary | ICD-10-CM | POA: Diagnosis not present

## 2019-09-10 DIAGNOSIS — M797 Fibromyalgia: Secondary | ICD-10-CM | POA: Diagnosis not present

## 2019-09-10 DIAGNOSIS — E860 Dehydration: Secondary | ICD-10-CM | POA: Diagnosis not present

## 2019-09-10 DIAGNOSIS — K859 Acute pancreatitis without necrosis or infection, unspecified: Secondary | ICD-10-CM | POA: Diagnosis not present

## 2019-09-10 DIAGNOSIS — M479 Spondylosis, unspecified: Secondary | ICD-10-CM | POA: Diagnosis not present

## 2019-09-10 DIAGNOSIS — I10 Essential (primary) hypertension: Secondary | ICD-10-CM | POA: Diagnosis not present

## 2019-09-10 DIAGNOSIS — F419 Anxiety disorder, unspecified: Secondary | ICD-10-CM | POA: Diagnosis not present

## 2019-09-11 DIAGNOSIS — E860 Dehydration: Secondary | ICD-10-CM | POA: Diagnosis not present

## 2019-09-11 DIAGNOSIS — K859 Acute pancreatitis without necrosis or infection, unspecified: Secondary | ICD-10-CM | POA: Diagnosis not present

## 2019-09-11 DIAGNOSIS — F419 Anxiety disorder, unspecified: Secondary | ICD-10-CM | POA: Diagnosis not present

## 2019-09-11 DIAGNOSIS — I10 Essential (primary) hypertension: Secondary | ICD-10-CM | POA: Diagnosis not present

## 2019-09-11 DIAGNOSIS — M479 Spondylosis, unspecified: Secondary | ICD-10-CM | POA: Diagnosis not present

## 2019-09-11 DIAGNOSIS — I951 Orthostatic hypotension: Secondary | ICD-10-CM | POA: Diagnosis not present

## 2019-09-11 DIAGNOSIS — G8929 Other chronic pain: Secondary | ICD-10-CM | POA: Diagnosis not present

## 2019-09-11 DIAGNOSIS — F0390 Unspecified dementia without behavioral disturbance: Secondary | ICD-10-CM | POA: Diagnosis not present

## 2019-09-11 DIAGNOSIS — M797 Fibromyalgia: Secondary | ICD-10-CM | POA: Diagnosis not present

## 2019-09-13 DIAGNOSIS — H109 Unspecified conjunctivitis: Secondary | ICD-10-CM | POA: Diagnosis not present

## 2019-09-15 DIAGNOSIS — E860 Dehydration: Secondary | ICD-10-CM | POA: Diagnosis not present

## 2019-09-15 DIAGNOSIS — G8929 Other chronic pain: Secondary | ICD-10-CM | POA: Diagnosis not present

## 2019-09-15 DIAGNOSIS — K859 Acute pancreatitis without necrosis or infection, unspecified: Secondary | ICD-10-CM | POA: Diagnosis not present

## 2019-09-15 DIAGNOSIS — F419 Anxiety disorder, unspecified: Secondary | ICD-10-CM | POA: Diagnosis not present

## 2019-09-15 DIAGNOSIS — M479 Spondylosis, unspecified: Secondary | ICD-10-CM | POA: Diagnosis not present

## 2019-09-15 DIAGNOSIS — I951 Orthostatic hypotension: Secondary | ICD-10-CM | POA: Diagnosis not present

## 2019-09-15 DIAGNOSIS — F0390 Unspecified dementia without behavioral disturbance: Secondary | ICD-10-CM | POA: Diagnosis not present

## 2019-09-15 DIAGNOSIS — I10 Essential (primary) hypertension: Secondary | ICD-10-CM | POA: Diagnosis not present

## 2019-09-15 DIAGNOSIS — M797 Fibromyalgia: Secondary | ICD-10-CM | POA: Diagnosis not present

## 2019-09-16 DIAGNOSIS — R638 Other symptoms and signs concerning food and fluid intake: Secondary | ICD-10-CM | POA: Diagnosis not present

## 2019-09-16 DIAGNOSIS — R1013 Epigastric pain: Secondary | ICD-10-CM | POA: Diagnosis not present

## 2019-09-16 DIAGNOSIS — R112 Nausea with vomiting, unspecified: Secondary | ICD-10-CM | POA: Diagnosis not present

## 2019-09-16 DIAGNOSIS — K859 Acute pancreatitis without necrosis or infection, unspecified: Secondary | ICD-10-CM | POA: Diagnosis not present

## 2019-09-17 ENCOUNTER — Telehealth: Payer: Self-pay | Admitting: Neurology

## 2019-09-17 NOTE — Telephone Encounter (Signed)
Noted  

## 2019-09-17 NOTE — Telephone Encounter (Signed)
Pt's husband came by today wanting to let us know that Mrs. Koller just got out of the hospital and has been having a hard time eating since she was in the hospital. He also stated that her memory has gone down as well and she doesn't remember that she needs to eat. He states he is just wanting [place] to be aware before she comes for her next appointment on 04/28. FYI

## 2019-09-17 NOTE — Telephone Encounter (Signed)
Noted, thank you

## 2019-09-18 DIAGNOSIS — K859 Acute pancreatitis without necrosis or infection, unspecified: Secondary | ICD-10-CM | POA: Diagnosis not present

## 2019-09-18 DIAGNOSIS — H5711 Ocular pain, right eye: Secondary | ICD-10-CM | POA: Diagnosis not present

## 2019-09-18 DIAGNOSIS — E860 Dehydration: Secondary | ICD-10-CM | POA: Diagnosis not present

## 2019-09-18 DIAGNOSIS — G8929 Other chronic pain: Secondary | ICD-10-CM | POA: Diagnosis not present

## 2019-09-18 DIAGNOSIS — I951 Orthostatic hypotension: Secondary | ICD-10-CM | POA: Diagnosis not present

## 2019-09-18 DIAGNOSIS — H18221 Idiopathic corneal edema, right eye: Secondary | ICD-10-CM | POA: Diagnosis not present

## 2019-09-18 DIAGNOSIS — M797 Fibromyalgia: Secondary | ICD-10-CM | POA: Diagnosis not present

## 2019-09-18 DIAGNOSIS — F419 Anxiety disorder, unspecified: Secondary | ICD-10-CM | POA: Diagnosis not present

## 2019-09-18 DIAGNOSIS — M479 Spondylosis, unspecified: Secondary | ICD-10-CM | POA: Diagnosis not present

## 2019-09-18 DIAGNOSIS — I10 Essential (primary) hypertension: Secondary | ICD-10-CM | POA: Diagnosis not present

## 2019-09-18 DIAGNOSIS — F0390 Unspecified dementia without behavioral disturbance: Secondary | ICD-10-CM | POA: Diagnosis not present

## 2019-09-22 ENCOUNTER — Other Ambulatory Visit: Payer: Self-pay

## 2019-09-22 ENCOUNTER — Ambulatory Visit (INDEPENDENT_AMBULATORY_CARE_PROVIDER_SITE_OTHER): Payer: Medicare HMO | Admitting: Psychology

## 2019-09-22 ENCOUNTER — Encounter: Payer: Self-pay | Admitting: Psychology

## 2019-09-22 ENCOUNTER — Ambulatory Visit: Payer: Medicare HMO | Admitting: Psychology

## 2019-09-22 DIAGNOSIS — F0280 Dementia in other diseases classified elsewhere without behavioral disturbance: Secondary | ICD-10-CM

## 2019-09-22 DIAGNOSIS — R4189 Other symptoms and signs involving cognitive functions and awareness: Secondary | ICD-10-CM

## 2019-09-22 DIAGNOSIS — F0150 Vascular dementia without behavioral disturbance: Secondary | ICD-10-CM

## 2019-09-22 DIAGNOSIS — R413 Other amnesia: Secondary | ICD-10-CM

## 2019-09-22 DIAGNOSIS — F0390 Unspecified dementia without behavioral disturbance: Secondary | ICD-10-CM

## 2019-09-22 HISTORY — DX: Dementia in other diseases classified elsewhere, unspecified severity, without behavioral disturbance, psychotic disturbance, mood disturbance, and anxiety: F02.80

## 2019-09-22 HISTORY — DX: Vascular dementia without behavioral disturbance: F01.50

## 2019-09-22 HISTORY — DX: Unspecified dementia without behavioral disturbance: F03.90

## 2019-09-22 NOTE — Progress Notes (Signed)
NEUROPSYCHOLOGICAL EVALUATION [place]. [facility] [place] Department of Neurology  Reason for Referral:   Jessica Whitney is a 74 y.o. right-handed Caucasian female referred by Huston Foley, M.D., to characterize her current cognitive functioning and assist with diagnostic clarity and treatment planning in the context of subjective cognitive decline and concerns surrounding a neurodegenerative illness.  Assessment and Plan:   Clinical Impression(s): Since her hospitalization for acute pancreatitis from 08/30/19 to 09/06/19, Jessica Whitney's husband reported that she has yet to return to normal eating patterns and does not hydrate as she should. She has remained in a "weakened" state since her discharge, which is likely influencing her overall presentation. During testing, Jessica Whitney fatigued quickly. She also reported emerging symptoms of nausea and seeing "black spots" in her visual field. When left briefly by the psychometrist to check in with myself, she was found lying her head on the examination desk due to symptoms of fatigue and her not feeling well upon the psychometrist's return. Due to obvious symptoms of fatigue and discomfort, the evaluation was discontinued and postponed to a later date. Completed test scores are provided for the sake of record completion. However, no interpretation was performed due to current evaluation brevity.   Jessica Whitney was rescheduled to complete her neuropsychological evaluation on 10/17/2019 at 1:00pm.   Review of Records:   Most recently, Jessica Whitney was admitted to the hospital on 08/30/2019 with leukocytosis, tachycardia, severe epigastric abdominal pain, and nausea and vomiting with evidence of acute pancreatitis. Upon discharge, sepsis physiology had resolved. MRCP did not show any evidence of gallstones and it was suspected that she likely passed a stone. Abdomen CT revealed extensive inflammatory changes around the pancreas. A duodenal ulcer with  possible perforation was suspected; however, GI was consulted and felt it less likely to be any perforation or duodenal ulcer. She was ultimately discharged home on 09/06/2019.   Jessica Whitney was seen by [facility] Huston Foley, M.D.) on 07/24/2019 for follow-up of memory loss. Performance on a brief cognitive screening instrument (MMSE) was 20/30 in October 2020. At the time of her meeting with Dr. Frances Furbish, Jessica Whitney had recent blood work through her primary care office and was noted to have a highly elevated TSH; her Synthroid was subsequently increased. She was also noted to have a low vitamin B12 and was started on B12 injections. Brain MRI was also ordered at that time. Regarding memory loss, her husband reported her becoming more forgetful where she repeats herself often and forgets details of prior events (e.g., going to the doctor and having been checked out for a problem that she continues to feel is a problem). Memory loss was said to be ongoing for at least the past two years. She was started on donepezil. Ultimately, Jessica Whitney was referred for a comprehensive neuropsychological evaluation to characterize her cognitive abilities and to assist with diagnostic clarity and treatment planning.   Brain MRI on 06/01/2019 revealed mild generalized cortical atrophy most noticeable in the insular cortex, moderate chronic microvascular ischemic changes, and a single chronic microhemorrhage in the right temporal lobe, thought to unlikely be clinically significant.  Past Medical History:  Diagnosis Date  . Acute pancreatitis 08/30/2019  . Arthritis   . Diverticulitis   . Fibromyalgia   . GERD (gastroesophageal reflux disease)    bucchini  . Hypokalemia 08/30/2019  . Hypothyroidism 08/30/2019  . Major depressive disorder 08/30/2019  . Major neurocognitive disorder 09/22/2019  . PONV (postoperative nausea and vomiting)   .  Spinal stenosis   . Vitamin B12 deficiency   . Vitamin D deficiency       Past Surgical History:  Procedure Laterality Date  . APPENDECTOMY    . BACK SURGERY    . bone spur shoulder Bilateral   . BREAST EXCISIONAL BIOPSY Right   . BREAST SURGERY Right    lumpectomy non cancerous  . CHOLECYSTECTOMY    . COLON SURGERY     foot of colon removed - diverticulitis  . EYE SURGERY Left    cross-eye fixed  . JOINT REPLACEMENT Right    knee  . knee arthroscopic Right   . LUMBAR LAMINECTOMY/DECOMPRESSION MICRODISCECTOMY N/A 04/03/2013   Procedure: Lumbar Four-Five Lumbar diskectomy with Coflex;  Surgeon: Reinaldo Meeker, MD;  Location: [facility];  Service: Neurosurgery;  Laterality: N/A;  L4-5 Lumbar diskectomy with Coflex  . TRIGGER FINGER RELEASE Right    x2  . TUBAL LIGATION      Current Outpatient Medications:  .  donepezil (ARICEPT) 10 MG tablet, Take 1 tablet (10 mg total) by mouth at bedtime., Disp: , Rfl:  .  DULoxetine HCl 40 MG CPEP, Take 1 tablet by mouth in the morning and at bedtime. 2 tablets per day, Disp: , Rfl:  .  levothyroxine (SYNTHROID) 100 MCG tablet, Take 100 mcg by mouth daily before breakfast., Disp: , Rfl:  .  lidocaine (LIDODERM) 5 %, Place 1 patch onto the skin daily. Remove & Discard patch within 12 hours or as directed by MD. Lower back, Disp: 30 patch, Rfl: 0 .  metoprolol tartrate (LOPRESSOR) 25 MG tablet, Take 0.5 tablets (12.5 mg total) by mouth 2 (two) times daily., Disp: 60 tablet, Rfl: 2 .  ondansetron (ZOFRAN ODT) 4 MG disintegrating tablet, Take 1 tablet (4 mg total) by mouth every 8 (eight) hours as needed for nausea or vomiting., Disp: 30 tablet, Rfl: 0 .  pantoprazole (PROTONIX) 40 MG tablet, Take 40 mg by mouth every morning., Disp: , Rfl:  .  vitamin B-12 (CYANOCOBALAMIN) 1000 MCG tablet, Take 1,000 mcg by mouth daily., Disp: , Rfl:   Clinical Interview:   Cognitive Symptoms: Decreased short-term memory: Endorsed. Jessica Whitney described deficits in all aspects of memory and was largely unable to provide any examples.  Her husband reported that memory difficulties have been present for the past several years and have gradually worsened over time. They were noted to have exhibited a far more rapid decline over the past two months surrounding her being started on donepezil and her hospitalization for acute pancreatitis. Examples included repeating herself often, trouble remembering details of previous conversations, trouble remembering the names of familiar individuals, and trouble remembering upcoming appointments.  Decreased long-term memory: Denied. Decreased attention/concentration: Endorsed. She reported trouble staying focused and increased ease of distractibility. She also acknowledged that she has trouble paying attention when people are speaking to her at times.  Reduced processing speed: Endorsed. Difficulties with executive functions: Endorsed. Her husband noted prominent changes in her level of organization, detailing a history of advanced organizational abilities which have deteriorated considerably. Trouble with complex planning was also reported. She denied instances of acting impulsivity and both her and her husband denied overt personality changes.  Difficulties with emotion regulation: Denied. Difficulties with receptive language: Endorsed. As described above, these were largely attributed to attention/concentration deficits.  Difficulties with word finding: Denied. Decreased visuoperceptual ability: Denied.  Difficulties completing ADLs: Endorsed. Ms. Novakovich was said to appear to have trouble remembering to eat. Her husband reported  taking over medication organization and management during the past 1-2 months primarily as a precautionary measure to ensure that Ms. Antosh is taking her medications as instructed. He also took over Doctor, hospital and bill paying approximately 1-2 years prior. Ms. Shoun has not driven for the past few months. Prior to that, records suggest that she had limited her  driving. Her husband noted that her driving was fine prior to her hospitalization.   Additional Medical History: History of traumatic brain injury/concussion: Denied. History of stroke: Denied. History of seizure activity: Denied. History of known exposure to toxins: Denied. Symptoms of chronic pain: Endorsed. Medical records suggest a history of fibromyalgia. Ms. Rasmus largely denied trouble with chronic or acute pain during the current interview. She did acknowledge a remote history of knee surgeries.  Experience of frequent headaches/migraines: Largely denied currently. She reported a remote history of migraine headaches, but stated that these were "not as bad lately."  Frequent instances of dizziness/vertigo: Endorsed. These were said to be acute symptoms stemming from her recent hospitalization surrounding acute pancreatitis and her poor appetite/fluid intake since being discharged. Prior to her hospitalization, difficulties were denied.   Sensory changes: She reported acute symptoms of painful eye sensations and blurred vision. Her husband reported that they had an appointment to see the eye doctor later this afternoon (09/22/2019). Other sensory difficulties/changes (e.g., hearing, taste, or smell) were denied.  Balance/coordination difficulties: Endorsed. These were said to be acute symptoms stemming from her recent hospitalization surrounding acute pancreatitis and her poor appetite/fluid intake since being discharged. Prior to her hospitalization, difficulties were denied.  Other motor difficulties: Denied.  Sleep History: Estimated hours obtained each night: 8-10 hours. Difficulties falling asleep: Largely denied. Acutely, she reported some trouble falling asleep associated with her not feeling well.  Difficulties staying asleep: Denied. Feels rested and refreshed upon awakening: Endorsed.  History of snoring: Endorsed. Symptoms were said to be mild. History of waking up gasping for  air: Denied. Witnessed breath cessation while asleep: Denied.  History of vivid dreaming: Denied. Excessive movement while asleep: Denied. Instances of acting out her dreams: Denied.  Psychiatric/Behavioral Health History: Depression: Endorsed. Per medical records, Ms. Choy has a previous history of depression. She is currently taking duloxetine and she and her husband denied side effects from this medication. She described her current mood as "pretty good." Current or remote suicidal ideation, intent, or plan was denied.  Anxiety: Largely denied. Her husband did state feeling as though she exhibits mild anxiety at times surrounding fears that she will forget information being spoken to her.  Mania: Denied. Trauma History: Denied. Visual/auditory hallucinations: Denied. Delusional thoughts: Denied.  Tobacco: Denied. Alcohol: She reported very rare alcohol consumption and denied a history of problematic alcohol abuse or dependence.  Recreational drugs: Denied. Caffeine: Prior to her hospitalization, she reported consuming 1-2 cups of coffee in the morning, as well as several glasses of Coke and sweet tea throughout the day. This has been significantly diminished since her discharge.   Family History: History reviewed. No pertinent family history.   This information was confirmed by Ms. Siller.  Academic/Vocational History: Highest level of educational attainment: 13 years. 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 Math was noted as a relative weakness.  History of developmental delay: Denied. History of grade repetition: Denied. Enrollment in special education courses: Denied. History of LD/ADHD:  Denied.  Employment: Retired. She previously worked as a Research officer, trade union for 20 years.  Evaluation Results:   Behavioral  Observations: Ms. Balbach was accompanied by her husband, arrived to her appointment on time, and was appropriately dressed and groomed. She appeared alert and oriented. She ambulated slowly and reported ongoing bilateral lower extremity weakness stemming from her recent hospitalization. Gross motor functioning appeared intact upon informal observation and no abnormal movements (e.g., tremors) were noted. Her affect was generally relaxed and positive, but did range appropriately given the subject being discussed during the clinical interview or the task at hand during testing procedures. Spontaneous speech was fluent and word finding difficulties were not observed during the clinical interview. Thought processes were tangential but generally normal in content. Ms. Cavan was a limited historian and much of the information obtained during the clinical interview was provided by her husband. At one point towards the end of the interview, she made comments regarding personal photographs in the doctor's office, repeating herself 3 times in a relatively short (5-8 minute) time frame. During testing, Ms. Dumm fatigued quickly. She also reported emerging symptoms of nausea, leading the evaluation to be discontinued and postponed to another day.  Tables of Scores:   Note: This summary of test scores accompanies the interpretive report and should not be considered in isolation without reference to the appropriate sections in the text. Descriptors are based on appropriate normative data and may be adjusted based on clinical judgment. The terms "impaired" and "within normal limits (WNL)" are used when a more specific level of functioning cannot be determined.       Orientation:      Raw Score Percentile   NAB Orientation, Form 1 22/29 --- ---       Cognitive Screening:           Raw Score Percentile   SLUMS: 13/30 --- Impaired       Intellectual Functioning:           Standard Score Percentile   Test of  Premorbid Functioning: 92 30 Average       Attention/Executive Function:          Trail Making Test (TMT): Raw Score (T Score) Percentile     Part A 54 secs., 1 error (37) 9 Below Average    Part B Not attempted --- ---        Visuospatial/Visuoconstruction:      Raw Score Percentile   Clock Drawing: 9/10 --- Within Normal Limits   Informed Consent and Coding/Compliance:   Ms. Guinta was provided with a verbal description of the nature and purpose of the present neuropsychological evaluation. Also reviewed were the foreseeable risks and/or discomforts and benefits of the procedure, limits of confidentiality, and mandatory reporting requirements of this provider. The patient was given the opportunity to ask questions and receive answers about the evaluation. Oral consent to participate was provided by the patient.   This evaluation was conducted by Newman Nickels, Ph.D., licensed clinical neuropsychologist. Ms. Parma completed a comprehensive clinical interview with Dr. Milbert Coulter, billed as one unit [phone], and 25 minutes of cognitive testing and scoring, billed as one unit [phone]. Psychometrist Wallace Keller, B.S., assisted Dr. Milbert Coulter with test administration and scoring procedures. As a separate and discrete service, Dr. Milbert Coulter spent a total of 60 minutes in record review and report writing billed as one unit [phone].

## 2019-09-22 NOTE — Progress Notes (Addendum)
   Psychometrician Note   Cognitive testing was administered to Jessica Whitney by Wallace Keller, B.S. (psychometrist) under the supervision of Dr. Newman Nickels, Ph.D., licensed psychologist. Jessica Whitney quickly fatigued as the evaluation progressed. She also expressed symptoms of nausea, lightheadedness, and seeing "black dots" in her visual field. A total of 20 minutes were spent testing prior to Jessica Whitney expressing a desire to discontinue the evaluation. She was scheduled for a follow-up appointment on 10/17/19 with the goal of completing a comprehensive evaluation at that time.    A total of 25 minutes of billable time were spent face-to-face with Jessica Whitney by the psychometrist. This includes both test administration and scoring time. Billing for these services is reflected in the clinical report generated by Dr. Newman Nickels, Ph.D..  This note reflects time spent with the psychometrician and does not include test scores or any clinical interpretations made by Dr. Milbert Coulter. The full report will follow in a separate note.

## 2019-09-24 DIAGNOSIS — M479 Spondylosis, unspecified: Secondary | ICD-10-CM | POA: Diagnosis not present

## 2019-09-24 DIAGNOSIS — K859 Acute pancreatitis without necrosis or infection, unspecified: Secondary | ICD-10-CM | POA: Diagnosis not present

## 2019-09-24 DIAGNOSIS — G8929 Other chronic pain: Secondary | ICD-10-CM | POA: Diagnosis not present

## 2019-09-24 DIAGNOSIS — F0390 Unspecified dementia without behavioral disturbance: Secondary | ICD-10-CM | POA: Diagnosis not present

## 2019-09-24 DIAGNOSIS — F419 Anxiety disorder, unspecified: Secondary | ICD-10-CM | POA: Diagnosis not present

## 2019-09-24 DIAGNOSIS — I951 Orthostatic hypotension: Secondary | ICD-10-CM | POA: Diagnosis not present

## 2019-09-24 DIAGNOSIS — M797 Fibromyalgia: Secondary | ICD-10-CM | POA: Diagnosis not present

## 2019-09-24 DIAGNOSIS — E860 Dehydration: Secondary | ICD-10-CM | POA: Diagnosis not present

## 2019-09-24 DIAGNOSIS — I10 Essential (primary) hypertension: Secondary | ICD-10-CM | POA: Diagnosis not present

## 2019-09-25 DIAGNOSIS — I10 Essential (primary) hypertension: Secondary | ICD-10-CM | POA: Diagnosis not present

## 2019-09-25 DIAGNOSIS — I951 Orthostatic hypotension: Secondary | ICD-10-CM | POA: Diagnosis not present

## 2019-09-25 DIAGNOSIS — M797 Fibromyalgia: Secondary | ICD-10-CM | POA: Diagnosis not present

## 2019-09-25 DIAGNOSIS — F0390 Unspecified dementia without behavioral disturbance: Secondary | ICD-10-CM | POA: Diagnosis not present

## 2019-09-25 DIAGNOSIS — K859 Acute pancreatitis without necrosis or infection, unspecified: Secondary | ICD-10-CM | POA: Diagnosis not present

## 2019-09-25 DIAGNOSIS — E860 Dehydration: Secondary | ICD-10-CM | POA: Diagnosis not present

## 2019-09-25 DIAGNOSIS — M479 Spondylosis, unspecified: Secondary | ICD-10-CM | POA: Diagnosis not present

## 2019-09-25 DIAGNOSIS — G8929 Other chronic pain: Secondary | ICD-10-CM | POA: Diagnosis not present

## 2019-09-25 DIAGNOSIS — F419 Anxiety disorder, unspecified: Secondary | ICD-10-CM | POA: Diagnosis not present

## 2019-09-29 ENCOUNTER — Encounter: Payer: Medicare HMO | Admitting: Psychology

## 2019-09-29 DIAGNOSIS — I951 Orthostatic hypotension: Secondary | ICD-10-CM | POA: Diagnosis not present

## 2019-09-29 DIAGNOSIS — F419 Anxiety disorder, unspecified: Secondary | ICD-10-CM | POA: Diagnosis not present

## 2019-09-29 DIAGNOSIS — K859 Acute pancreatitis without necrosis or infection, unspecified: Secondary | ICD-10-CM | POA: Diagnosis not present

## 2019-09-29 DIAGNOSIS — F0390 Unspecified dementia without behavioral disturbance: Secondary | ICD-10-CM | POA: Diagnosis not present

## 2019-09-29 DIAGNOSIS — E860 Dehydration: Secondary | ICD-10-CM | POA: Diagnosis not present

## 2019-09-29 DIAGNOSIS — M479 Spondylosis, unspecified: Secondary | ICD-10-CM | POA: Diagnosis not present

## 2019-09-29 DIAGNOSIS — G8929 Other chronic pain: Secondary | ICD-10-CM | POA: Diagnosis not present

## 2019-09-29 DIAGNOSIS — M797 Fibromyalgia: Secondary | ICD-10-CM | POA: Diagnosis not present

## 2019-09-29 DIAGNOSIS — I10 Essential (primary) hypertension: Secondary | ICD-10-CM | POA: Diagnosis not present

## 2019-10-01 DIAGNOSIS — I10 Essential (primary) hypertension: Secondary | ICD-10-CM | POA: Diagnosis not present

## 2019-10-01 DIAGNOSIS — F419 Anxiety disorder, unspecified: Secondary | ICD-10-CM | POA: Diagnosis not present

## 2019-10-01 DIAGNOSIS — K859 Acute pancreatitis without necrosis or infection, unspecified: Secondary | ICD-10-CM | POA: Diagnosis not present

## 2019-10-01 DIAGNOSIS — E860 Dehydration: Secondary | ICD-10-CM | POA: Diagnosis not present

## 2019-10-01 DIAGNOSIS — F0390 Unspecified dementia without behavioral disturbance: Secondary | ICD-10-CM | POA: Diagnosis not present

## 2019-10-01 DIAGNOSIS — M479 Spondylosis, unspecified: Secondary | ICD-10-CM | POA: Diagnosis not present

## 2019-10-01 DIAGNOSIS — I951 Orthostatic hypotension: Secondary | ICD-10-CM | POA: Diagnosis not present

## 2019-10-01 DIAGNOSIS — G8929 Other chronic pain: Secondary | ICD-10-CM | POA: Diagnosis not present

## 2019-10-01 DIAGNOSIS — M797 Fibromyalgia: Secondary | ICD-10-CM | POA: Diagnosis not present

## 2019-10-06 ENCOUNTER — Telehealth: Payer: Self-pay | Admitting: Neurology

## 2019-10-06 DIAGNOSIS — E860 Dehydration: Secondary | ICD-10-CM | POA: Diagnosis not present

## 2019-10-06 DIAGNOSIS — F419 Anxiety disorder, unspecified: Secondary | ICD-10-CM | POA: Diagnosis not present

## 2019-10-06 DIAGNOSIS — F0390 Unspecified dementia without behavioral disturbance: Secondary | ICD-10-CM | POA: Diagnosis not present

## 2019-10-06 DIAGNOSIS — K859 Acute pancreatitis without necrosis or infection, unspecified: Secondary | ICD-10-CM | POA: Diagnosis not present

## 2019-10-06 DIAGNOSIS — I951 Orthostatic hypotension: Secondary | ICD-10-CM | POA: Diagnosis not present

## 2019-10-06 DIAGNOSIS — G8929 Other chronic pain: Secondary | ICD-10-CM | POA: Diagnosis not present

## 2019-10-06 DIAGNOSIS — M479 Spondylosis, unspecified: Secondary | ICD-10-CM | POA: Diagnosis not present

## 2019-10-06 DIAGNOSIS — M797 Fibromyalgia: Secondary | ICD-10-CM | POA: Diagnosis not present

## 2019-10-06 DIAGNOSIS — I10 Essential (primary) hypertension: Secondary | ICD-10-CM | POA: Diagnosis not present

## 2019-10-06 NOTE — Telephone Encounter (Signed)
This is FYI :Beth,RN @ [facility] called to report today is her last day with pt.  She wanted the office to know that pt should possibly be tried on another medication other than donepezil (ARICEPT) 10 MG tablet. Beth,RN states that within  A few months pt has loss about 30 pounds.  Beth states this was around the time pt started the  donepezil (ARICEPT) 10 MG tablet.  If there are questions Waynetta Sandy stated she can be reached at [phone]

## 2019-10-06 NOTE — Telephone Encounter (Signed)
I called pt's husband, Richard, per DPR, and discussed this with him. He will hold aricept for now. I offered a sooner appt with an NP but pt was unable to complete neuro-psych testing and that was rescheduled. They would prefer to wait until after neuro-psych testing to follow up. Pt's husband verbalized understanding of recommendations.

## 2019-10-06 NOTE — Telephone Encounter (Signed)
Please call patient or husband re: weight loss. If they would like to stop the Aricept, we can do that. Her weight loss may be due to her recent medical complication, ie gallstone pancreatitis and sepsis. Nevertheless, if they feel that her weight loss is related to Aricept, we can hold it for now.

## 2019-10-08 ENCOUNTER — Emergency Department (HOSPITAL_BASED_OUTPATIENT_CLINIC_OR_DEPARTMENT_OTHER)
Admission: EM | Admit: 2019-10-08 | Discharge: 2019-10-08 | Disposition: A | Discharge status: Home / Self Care | Payer: Medicare HMO | Attending: Emergency Medicine | Admitting: Emergency Medicine

## 2019-10-08 ENCOUNTER — Other Ambulatory Visit: Payer: Self-pay

## 2019-10-08 ENCOUNTER — Encounter (HOSPITAL_BASED_OUTPATIENT_CLINIC_OR_DEPARTMENT_OTHER): Payer: Self-pay | Admitting: Emergency Medicine

## 2019-10-08 DIAGNOSIS — W500XXA Accidental hit or strike by another person, initial encounter: Secondary | ICD-10-CM | POA: Insufficient documentation

## 2019-10-08 DIAGNOSIS — G8929 Other chronic pain: Secondary | ICD-10-CM | POA: Diagnosis not present

## 2019-10-08 DIAGNOSIS — Y92003 Bedroom of unspecified non-institutional (private) residence as the place of occurrence of the external cause: Secondary | ICD-10-CM | POA: Insufficient documentation

## 2019-10-08 DIAGNOSIS — S0502XA Injury of conjunctiva and corneal abrasion without foreign body, left eye, initial encounter: Secondary | ICD-10-CM | POA: Diagnosis not present

## 2019-10-08 DIAGNOSIS — S0592XA Unspecified injury of left eye and orbit, initial encounter: Secondary | ICD-10-CM | POA: Diagnosis present

## 2019-10-08 DIAGNOSIS — I10 Essential (primary) hypertension: Secondary | ICD-10-CM | POA: Diagnosis not present

## 2019-10-08 DIAGNOSIS — Y9389 Activity, other specified: Secondary | ICD-10-CM | POA: Insufficient documentation

## 2019-10-08 DIAGNOSIS — Z79899 Other long term (current) drug therapy: Secondary | ICD-10-CM | POA: Insufficient documentation

## 2019-10-08 DIAGNOSIS — F419 Anxiety disorder, unspecified: Secondary | ICD-10-CM | POA: Diagnosis not present

## 2019-10-08 DIAGNOSIS — F0390 Unspecified dementia without behavioral disturbance: Secondary | ICD-10-CM | POA: Diagnosis not present

## 2019-10-08 DIAGNOSIS — Y999 Unspecified external cause status: Secondary | ICD-10-CM | POA: Insufficient documentation

## 2019-10-08 DIAGNOSIS — E039 Hypothyroidism, unspecified: Secondary | ICD-10-CM | POA: Insufficient documentation

## 2019-10-08 DIAGNOSIS — I951 Orthostatic hypotension: Secondary | ICD-10-CM | POA: Diagnosis not present

## 2019-10-08 DIAGNOSIS — M797 Fibromyalgia: Secondary | ICD-10-CM | POA: Diagnosis not present

## 2019-10-08 DIAGNOSIS — E860 Dehydration: Secondary | ICD-10-CM | POA: Diagnosis not present

## 2019-10-08 DIAGNOSIS — M479 Spondylosis, unspecified: Secondary | ICD-10-CM | POA: Diagnosis not present

## 2019-10-08 DIAGNOSIS — Z87891 Personal history of nicotine dependence: Secondary | ICD-10-CM | POA: Diagnosis not present

## 2019-10-08 DIAGNOSIS — K859 Acute pancreatitis without necrosis or infection, unspecified: Secondary | ICD-10-CM | POA: Diagnosis not present

## 2019-10-08 MED ORDER — FLUORESCEIN SODIUM 1 MG OP STRP
ORAL_STRIP | OPHTHALMIC | Status: AC
  Administered 2019-10-08: 1
  Filled 2019-10-08: qty 2

## 2019-10-08 MED ORDER — TETRACAINE HCL 0.5 % OP SOLN
OPHTHALMIC | Status: AC
  Administered 2019-10-08: 1 [drp]
  Filled 2019-10-08: qty 4

## 2019-10-08 MED ORDER — CIPROFLOXACIN HCL 0.3 % OP SOLN: 2.0000 [drp] | OPHTHALMIC | 0 refills | Status: DC
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
## 2019-10-08 NOTE — Progress Notes (Signed)
She is scheduled 10-22-19.  FYI

## 2019-10-09 DIAGNOSIS — I951 Orthostatic hypotension: Secondary | ICD-10-CM | POA: Diagnosis not present

## 2019-10-09 DIAGNOSIS — K859 Acute pancreatitis without necrosis or infection, unspecified: Secondary | ICD-10-CM | POA: Diagnosis not present

## 2019-10-09 DIAGNOSIS — G8929 Other chronic pain: Secondary | ICD-10-CM | POA: Diagnosis not present

## 2019-10-09 DIAGNOSIS — F419 Anxiety disorder, unspecified: Secondary | ICD-10-CM | POA: Diagnosis not present

## 2019-10-09 DIAGNOSIS — M479 Spondylosis, unspecified: Secondary | ICD-10-CM | POA: Diagnosis not present

## 2019-10-09 DIAGNOSIS — I10 Essential (primary) hypertension: Secondary | ICD-10-CM | POA: Diagnosis not present

## 2019-10-09 DIAGNOSIS — H16141 Punctate keratitis, right eye: Secondary | ICD-10-CM | POA: Diagnosis not present

## 2019-10-09 DIAGNOSIS — H18221 Idiopathic corneal edema, right eye: Secondary | ICD-10-CM | POA: Diagnosis not present

## 2019-10-09 DIAGNOSIS — F0390 Unspecified dementia without behavioral disturbance: Secondary | ICD-10-CM | POA: Diagnosis not present

## 2019-10-09 DIAGNOSIS — E860 Dehydration: Secondary | ICD-10-CM | POA: Diagnosis not present

## 2019-10-09 DIAGNOSIS — M797 Fibromyalgia: Secondary | ICD-10-CM | POA: Diagnosis not present

## 2019-10-10 ENCOUNTER — Other Ambulatory Visit: Payer: Self-pay

## 2019-10-10 ENCOUNTER — Ambulatory Visit: Payer: Medicare HMO | Admitting: Psychology

## 2019-10-10 DIAGNOSIS — R4189 Other symptoms and signs involving cognitive functions and awareness: Secondary | ICD-10-CM

## 2019-10-10 NOTE — Progress Notes (Signed)
   Psychometrician Note   Cognitive testing was administered to Jessica Whitney by Wallace Keller, B.S. (psychometrist) under the supervision of Dr. Newman Nickels, Ph.D., licensed psychologist. [name] did not appear overtly distressed by the testing session per behavioral observation or responses across self-report questionnaires. Dr. Newman Nickels, Ph.D. checked in with [name] as needed to manage any distress related to testing procedures (if applicable). Rest breaks were offered.    The battery of tests administered was selected by Dr. Newman Nickels, Ph.D. with consideration to [name]'s current level of functioning, the nature of her symptoms, emotional and behavioral responses during interview, level of literacy, observed level of motivation/effort, and the nature of the referral question. This battery was communicated to the psychometrist. Communication between Dr. Newman Nickels, Ph.D. and the psychometrist was ongoing throughout the evaluation and Dr. Newman Nickels, Ph.D. was immediately accessible at all times. Dr. Newman Nickels, Ph.D. provided supervision to the psychometrist on the date of this service to the extent necessary to assure the quality of all services provided.    Jessica Whitney will return within approximately 1-2 weeks for an interactive feedback session with Dr. Milbert Coulter at which time her test performances, clinical impressions, and treatment recommendations will be reviewed in detail. [name] understands she can contact our office should she require our assistance before this time.  A total of 110 minutes of billable time were spent face-to-face with [name] by the psychometrist. This includes both test administration and scoring time. Billing for these services is reflected in the clinical report generated by Dr. Newman Nickels, Ph.D..  This note reflects time spent with the psychometrician and does not include test scores or any clinical  interpretations made by Dr. Milbert Coulter. The full report will follow in a separate note.

## 2019-10-13 ENCOUNTER — Other Ambulatory Visit: Payer: Self-pay

## 2019-10-13 ENCOUNTER — Ambulatory Visit (INDEPENDENT_AMBULATORY_CARE_PROVIDER_SITE_OTHER): Payer: Medicare HMO | Admitting: Psychology

## 2019-10-13 ENCOUNTER — Encounter: Payer: Self-pay | Admitting: Psychology

## 2019-10-13 DIAGNOSIS — G309 Alzheimer's disease, unspecified: Secondary | ICD-10-CM

## 2019-10-13 DIAGNOSIS — E860 Dehydration: Secondary | ICD-10-CM | POA: Diagnosis not present

## 2019-10-13 DIAGNOSIS — F419 Anxiety disorder, unspecified: Secondary | ICD-10-CM | POA: Diagnosis not present

## 2019-10-13 DIAGNOSIS — M479 Spondylosis, unspecified: Secondary | ICD-10-CM | POA: Diagnosis not present

## 2019-10-13 DIAGNOSIS — F0390 Unspecified dementia without behavioral disturbance: Secondary | ICD-10-CM

## 2019-10-13 DIAGNOSIS — F0150 Vascular dementia without behavioral disturbance: Secondary | ICD-10-CM

## 2019-10-13 DIAGNOSIS — M797 Fibromyalgia: Secondary | ICD-10-CM | POA: Diagnosis not present

## 2019-10-13 DIAGNOSIS — I951 Orthostatic hypotension: Secondary | ICD-10-CM | POA: Diagnosis not present

## 2019-10-13 DIAGNOSIS — K859 Acute pancreatitis without necrosis or infection, unspecified: Secondary | ICD-10-CM | POA: Diagnosis not present

## 2019-10-13 DIAGNOSIS — G8929 Other chronic pain: Secondary | ICD-10-CM | POA: Diagnosis not present

## 2019-10-13 DIAGNOSIS — I10 Essential (primary) hypertension: Secondary | ICD-10-CM | POA: Diagnosis not present

## 2019-10-13 NOTE — Progress Notes (Addendum)
NEUROPSYCHOLOGICAL EVALUATION [place]. [facility] Department of Neurology  Date of Interview: September 22, 2019 Date of Evaluation: October 13, 2019  Reason for Referral:   Jessica Whitney a 74 y.o. right-handed Caucasian female referred by Huston Foley, M.D.,to characterize hercurrent cognitive functioning and assist with diagnostic clarity and treatment planning in the context of subjective cognitive decline and concerns surrounding a neurodegenerative illness.  Assessment and Plan:   Clinical Impression(s): Jessica Whitney's pattern of performance is suggestive of primary deficits surrounding executive functioning (including working memory), receptive language, and Artist. Performance variability was exhibited across phonemic fluency, semantic fluency (with a majority of performances below expectation), and visuospatial abilities. Performance was appropriate across domains of processing speed and confrontation naming. Jessica Whitney and her husband reported difficulties completing instrumental activities of daily living (ADLs) surrounding her remembering to eat independently, as well as trouble with financial and medication management. This, coupled with evidence for cognitive dysfunction described above, suggests that she meets criteria for a Major Neurocognitive Disorder (formerly "dementia") at the present time.  The etiology for her cognitive decline is unclear. Prominent memory dysfunction with largely amnestic retrieval and poor recognition performances is certainly concerning for Alzheimer's disease. Ongoing executive dysfunction and the discrepancy between semantic and phonemic fluency scores (the former often worse than the latter) is further concerning for this condition. However, she performed well across a list learning recognition task, as well as two confrontation naming assessments, which is inconsistent with the typical Alzheimer's disease presentation.  This could reflect that Jessica Whitney is still in the earlier stages of this illness if indeed present. Previous neuroimaging suggested moderate chronic microvascular ischemic changes, raising the potential for a vascular contribution to her current cognitive profile. Her clinical presentation is not concerning for other common types of dementia (e.g., Lewy body dementia or frontotemporal dementia) at the present time. It is also worth noting the potential for current performances to be mildly attenuated given her recent hospitalization and residual physical symptoms. However, memory deficits are believed to be above and beyond these symptoms alone. Continued medical monitoring will be important moving forward.   Recommendations: A repeat neuropsychological evaluation in 12-18 months (or sooner if functional decline is noted) is recommended to assess the trajectory of future cognitive decline should it occur. This will also aid in future efforts towards improved diagnostic clarity and allow Jessica Whitney to be assessed further from the effects of her recent hospitalization.  Performance across neurocognitive testing is not a strong predictor of an individual's safety operating a motor vehicle. However, trouble with executive functioning, especially cognitive flexibility, does raise some concerns. Should her and her husband wish to pursue a formalized driving evaluation, they would be encouraged to contact [employer] in [place], [place] at [phone]. Another option would be through [facility]; however, the latter would likely require a referral from a medical doctor. [facility] can be reached directly at [phone].   Should there be a progression of her current deficits over time, [name] is unlikely to regain any independent living skills lost. Therefore, it is recommended that she remain as involved as possible in all aspects of household chores, finances, and medication  management, with supervision to ensure adequate performance. Adequate supervision will be important as performance was in the exceptionally low range on a safety and judgment task requiring her to provide responses to various day-to-day safety scenarios. She will likely benefit from the establishment and maintenance of a  routine in order to maximize her functional abilities over time.  It will be important for Jessica Whitney to have another person with her when in situations where she may need to process information, weigh the pros and cons of different options, and make decisions, in order to ensure that she fully understands and recalls all information to be considered.  When learning new information, she would benefit from information being broken up into small, manageable pieces. She may also find it helpful to articulate the material in her own words and in a context to promote encoding at the onset of a new task. This material may need to be repeated multiple times to promote encoding. All important information to remember should be provided in written format in all instances.   To address problems with executive dysfunction, she may wish to consider:   -Avoiding external distractions when needing to concentrate   -Limiting exposure to fast paced environments with multiple sensory demands   -Writing down complicated information and using checklists   -Attempting and completing one task at a time (i.e., no multi-tasking)   -Verbalizing aloud each step of a task to maintain focus   -Taking frequent breaks during the completion of steps/tasks to avoid fatigue   -Reducing the amount of information considered at one time   -Scheduling more difficult activities for a time of day where she is usually most alert  Review of Records:   Most recently, Jessica Whitney was admitted to the hospital on 08/30/2019 with leukocytosis, tachycardia, severe epigastric abdominal pain, and nausea and vomiting with evidence of  acute pancreatitis. Upon discharge, sepsis physiology had resolved. MRCP did not show any evidence of gallstones and it was suspected that she likely passed a stone. Abdomen CT revealed extensive inflammatory changes around the pancreas. A duodenal ulcer with possible perforation was suspected; however, GI was consulted and felt it less likely to be any perforation or duodenal ulcer. She was ultimately discharged home on 09/06/2019.   Ms. Fleissner was seen by [facility] Huston Foley, M.D.) on 07/24/2019 for follow-up of memory loss. Performance on a brief cognitive screening instrument (MMSE) was 20/30 in October 2020. At the time of her meeting with Dr. Frances Furbish, Ms. Tourangeau had recent blood work through her primary care office and was noted to have a highly elevated TSH; her Synthroid was subsequently increased. She was also noted to have a low vitamin B12 and was started on B12 injections. Brain MRI was also ordered at that time. Regarding memory loss, her husband reported her becoming more forgetful where she repeats herself often and forgets details of prior events (e.g., going to the doctor and having been checked out for a problem that she continues to feel is a problem). Memory loss was said to be ongoing for at least the past two years. She was started on donepezil. Ultimately, Ms. Bloch was referred for a comprehensive neuropsychological evaluation to characterize her cognitive abilities and to assist with diagnostic clarity and treatment planning.   Brain MRI on 06/01/2019 revealed mild generalized cortical atrophy most noticeable in the insular cortex, moderate chronic microvascular ischemic changes, and a single chronic microhemorrhage in the right temporal lobe, thought to unlikely be clinically significant.  Past Medical History:  Diagnosis Date  . Acute pancreatitis 08/30/2019  . Arthritis   . Diverticulitis   . Fibromyalgia   . GERD (gastroesophageal reflux disease)     bucchini  . Hypokalemia 08/30/2019  . Hypothyroidism 08/30/2019  . Major depressive disorder  08/30/2019  . Major neurocognitive disorder 09/22/2019  . PONV (postoperative nausea and vomiting)   . Spinal stenosis   . Vitamin B12 deficiency   . Vitamin D deficiency     Past Surgical History:  Procedure Laterality Date  . APPENDECTOMY    . BACK SURGERY    . bone spur shoulder Bilateral   . BREAST EXCISIONAL BIOPSY Right   . BREAST SURGERY Right    lumpectomy non cancerous  . CHOLECYSTECTOMY    . COLON SURGERY     foot of colon removed - diverticulitis  . EYE SURGERY Left    cross-eye fixed  . JOINT REPLACEMENT Right    knee  . knee arthroscopic Right   . LUMBAR LAMINECTOMY/DECOMPRESSION MICRODISCECTOMY N/A 04/03/2013   Procedure: Lumbar Four-Five Lumbar diskectomy with Coflex;  Surgeon: Reinaldo Meeker, MD;  Location: [facility];  Service: Neurosurgery;  Laterality: N/A;  L4-5 Lumbar diskectomy with Coflex  . TRIGGER FINGER RELEASE Right    x2  . TUBAL LIGATION      Current Outpatient Medications:  .  ciprofloxacin (CILOXAN) 0.3 % ophthalmic solution, Place 2 drops into the left eye every 4 (four) hours while awake. Administer 1 drop, every 2 hours, while awake, for 2 days. Then 1 drop, every 4 hours, while awake, for the next 5 days., Disp: 5 mL, Rfl: 0 .  donepezil (ARICEPT) 10 MG tablet, Take 1 tablet (10 mg total) by mouth at bedtime., Disp: , Rfl:  .  DULoxetine HCl 40 MG CPEP, Take 1 tablet by mouth in the morning and at bedtime. 2 tablets per day, Disp: , Rfl:  .  levothyroxine (SYNTHROID) 100 MCG tablet, Take 100 mcg by mouth daily before breakfast., Disp: , Rfl:  .  lidocaine (LIDODERM) 5 %, Place 1 patch onto the skin daily. Remove & Discard patch within 12 hours or as directed by MD. Lower back, Disp: 30 patch, Rfl: 0 .  metoprolol tartrate (LOPRESSOR) 25 MG tablet, Take 0.5 tablets (12.5 mg total) by mouth 2 (two) times daily., Disp: 60 tablet, Rfl: 2 .  ondansetron (ZOFRAN  ODT) 4 MG disintegrating tablet, Take 1 tablet (4 mg total) by mouth every 8 (eight) hours as needed for nausea or vomiting., Disp: 30 tablet, Rfl: 0 .  pantoprazole (PROTONIX) 40 MG tablet, Take 40 mg by mouth every morning., Disp: , Rfl:  .  vitamin B-12 (CYANOCOBALAMIN) 1000 MCG tablet, Take 1,000 mcg by mouth daily., Disp: , Rfl:   Clinical Interview:   Cognitive Symptoms: Decreased short-term memory: Endorsed. Ms. Sachs described deficits in all aspects of memory and was largely unable to provide any examples. Her husband reported that memory difficulties have been present for the past several years and have gradually worsened over time. They were noted to have exhibited a far more rapid decline over the past two months surrounding her being started on donepezil and her hospitalization for acute pancreatitis. Examples included repeating herself often, trouble remembering details of previous conversations, trouble remembering the names of familiar individuals, and trouble remembering upcoming appointments.  Decreased long-term memory: Denied. Decreased attention/concentration: Endorsed. She reported trouble staying focused and increased ease of distractibility. She also acknowledged that she has trouble paying attention when people are speaking to her at times.  Reduced processing speed: Endorsed. Difficulties with executive functions: Endorsed. Her husband noted prominent changes in her level of organization, detailing a history of advanced organizational abilities which have deteriorated considerably. Trouble with complex planning was also reported. She denied  instances of acting impulsivity and both her and her husband denied overt personality changes.  Difficulties with emotion regulation: Denied. Difficulties with receptive language: Endorsed. As described above, these were largely attributed to attention/concentration deficits.  Difficulties with word finding: Denied. Decreased  visuoperceptual ability: Denied.  Difficulties completing ADLs: Endorsed. Ms. Kristine LineaCooley was said to appear to have trouble remembering to eat. Her husband reported taking over medication organization and management during the past 1-2 months primarily as a precautionary measure to ensure that Ms. Kristine LineaCooley is taking her medications as instructed. He also took over Landscape architectfinancial management and bill paying approximately 1-2 years prior. Ms. Kristine LineaCooley has not driven for the past few months. Prior to that, records suggest that she had limited her driving. Her husband noted that her driving was fine prior to her hospitalization.   Additional Medical History: History of traumatic brain injury/concussion: Denied. History of stroke: Denied. History of seizure activity: Denied. History of known exposure to toxins: Denied. Symptoms of chronic pain: Endorsed. Medical records suggest a history of fibromyalgia. Ms. Kristine LineaCooley largely denied trouble with chronic or acute pain during the current interview. She did acknowledge a remote history of knee surgeries.  Experience of frequent headaches/migraines: Largely denied currently. She reported a remote history of migraine headaches, but stated that these were "not as bad lately."  Frequent instances of dizziness/vertigo: Endorsed. These were said to be acute symptoms stemming from her recent hospitalization surrounding acute pancreatitis and her poor appetite/fluid intake since being discharged. Prior to her hospitalization, difficulties were denied.   Sensory changes: She reported acute symptoms of painful eye sensations and blurred vision. Her husband reported that they had an appointment to see the eye doctor later that afternoon (09/22/2019). Other sensory difficulties/changes (e.g., hearing, taste, or smell) were denied.  Balance/coordination difficulties: Endorsed. These were said to be acute symptoms stemming from her recent hospitalization surrounding acute pancreatitis and  her poor appetite/fluid intake since being discharged. Prior to her hospitalization, difficulties were denied.  Other motor difficulties: Denied.  Sleep History: Estimated hours obtained each night: 8-10 hours. Difficulties falling asleep: Largely denied. Acutely, she reported some trouble falling asleep associated with her not feeling well.  Difficulties staying asleep: Denied. Feels rested and refreshed upon awakening: Endorsed.  History of snoring: Endorsed. Symptoms were said to be mild. History of waking up gasping for air: Denied. Witnessed breath cessation while asleep: Denied.  History of vivid dreaming: Denied. Excessive movement while asleep: Denied. Instances of acting out her dreams: Denied.  Psychiatric/Behavioral Health History: Depression: Endorsed. Per medical records, Ms. Kristine LineaCooley has a previous history of depression. She is currently taking duloxetine and she and her husband denied side effects from this medication. She described her current mood as "pretty good." Current or remote suicidal ideation, intent, or plan was denied.  Anxiety: Largely denied. Her husband did state feeling as though she exhibits mild anxiety at times surrounding fears that she will forget information being spoken to her.  Mania: Denied. Trauma History: Denied. Visual/auditory hallucinations: Denied. Delusional thoughts: Denied.  Tobacco: Denied. Alcohol: She reported very rare alcohol consumption and denied a history of problematic alcohol abuse or dependence.  Recreational drugs: Denied. Caffeine: Prior to her hospitalization, she reported consuming 1-2 cups of coffee in the morning, as well as several glasses of Coke and sweet tea throughout the day. This has been significantly diminished since her discharge.   Family History: History reviewed. No pertinent family history.   This information was confirmed by Ms. Giannotti.  Academic/Vocational History:  Highest level of educational  attainment: 13 years. She graduated from high school and completed additional courses at [employer], finishing a Theatre manager. 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 Math was noted as a relative weakness.  History of developmental delay: Denied. History of grade repetition: Denied. Enrollment in special education courses: Denied. History of LD/ADHD: Denied.  Employment: Retired. She previously worked as a Research officer, trade union for 20 years.  Evaluation Results:   Behavioral Observations: At the time of the interview, Ms. Dibble was accompanied by her husband, arrived to her appointment on time, and was appropriately dressed and groomed. She appeared alert and oriented. She ambulated slowly and reported ongoing bilateral lower extremity weakness stemming from her recent hospitalization. Gross motor functioning appeared intact upon informal observation and no abnormal movements (e.g., tremors) were noted. Her affect was generally relaxed and positive, but did range appropriately given the subject being discussed during the clinical interview or the task at hand during testing procedures. Spontaneous speech was fluent and word finding difficulties were not observed during the clinical interview. Thought processes were tangential but generally normal in content. Ms. Reyburn was a limited historian and much of the information obtained during the clinical interview was provided by her husband. At one point towards the end of the interview, she made comments regarding personal photographs in the doctor's office, repeating herself 3 times in a relatively short (5-8 minute) time frame.   Throughout testing, Ms. Cayson commonly asked repetitive questions throughout the evaluation surrounding when the evaluation would end. She also expressed concern surrounding her husband not knowing the length of the testing and would that he would be worried about her. These questions  persisted several times even after being reassured that her husband was aware of the process and was waiting for her in the waiting room. Also of note, she presented with an eye injury and previously sought medical attention at [facility] two days prior. She could not remember what caused this injury despite its recent occurrence. During testing procedures, she appeared engaged and persisted well when challenged. Instructions had to be repeated several times, especially across more complex tasks (TMT B, D-KEFS Color-Word, Auditory Comprehension, etc.). Overall, she was cooperative with the testing process.   Adequacy of Effort: The validity of neuropsychological testing is limited by the extent to which the individual being tested may be assumed to have exerted adequate effort during testing. Ms. Scheper expressed her intention to perform to the best of her abilities and exhibited adequate task engagement and persistence. Scores across stand-alone and embedded performance validity measures were within expectation. As such, the results of the current evaluation are believed to be a valid representation of Ms. Mahnke's current cognitive functioning.  Test Results: Ms. Kindig was poorly oriented at the time of the current evaluation. She was unable to state the current year, month, date, day of the week, time, or current location.   Performance on a brief cognitive screening instrument (SLUMS) was very poor, consistent with individuals with a diagnosis of a major neurocognitive disorder.   Intellectual abilities based upon educational and vocational attainment were estimated to be in the average range. Premorbid abilities were estimated to be within the average range based upon a single-word reading test.   Processing speed was below average to average. Basic attention was average. More complex attention (e.g., working memory) was well below average to average. Executive functioning was exceptionally low,  representing a primary area  of weakness across the current evaluation. Performance was also in the exceptionally low range on a safety and judgment task requiring her to provide responses to various day-to-day safety scenarios.  Assessed receptive language abilities were exceptionally low with noted difficulties as verbal working memory demands increased. Assessed expressive language (e.g., verbal fluency and confrontation naming) was variable. Phonemic fluency was well below average to below average, semantic fluency was exceptionally low to average, and confrontation naming was average.   Assessed visuospatial/visuoconstructional abilities were variable. Constructional abilities were within normal limits, while more spatial tasks were well below average.    Learning (i.e., encoding) of novel verbal and visual information was exceptionally low. Spontaneous delayed recall (i.e., retrieval) of previously learned information was also exceptionally low. Retention rates were 50% (raw score of 1) across a story learning task, 0% across a list learning task, and 0% across a complex figure drawing task. Performance across recognition tasks was often impaired. However, she performed well across a list learning recognition test, suggesting some evidence for information consolidation.   Results of emotional screening instruments suggested that recent symptoms of generalized anxiety were in the mild range, while symptoms of depression were within normal limits. A screening instrument assessing recent sleep quality suggested the presence of minimal sleep dysfunction.  Tables of Scores:   Note: This summary of test scores accompanies the interpretive report and should not be considered in isolation without reference to the appropriate sections in the text. Descriptors are based on appropriate normative data and may be adjusted based on clinical judgment. The terms "impaired" and "within normal limits (WNL)" are used  when a more specific level of functioning cannot be determined.       Effort Testing:   DESCRIPTOR       Dot Counting Test: --- --- Within Expectation  RBANS Effort Index: --- --- Within Expectation  WAIS-IV Reliable Digit Span: --- --- Within Expectation       Orientation:      Raw Score Percentile   NAB Orientation, Form 1 16/29 --- ---       Cognitive Screening:           Raw Score Percentile   SLUMS: 12/30 --- ---       RBANS, Form A: Standard Score/ Scaled Score Percentile   Total Score 65 1 Exceptionally Low  Immediate Memory 49 <1 Exceptionally Low    List Learning 3 1 Exceptionally Low    Story Memory 1 <1 Exceptionally Low  Visuospatial/Constructional 87 19 Below Average    Figure Copy 10 50 Average    Line Orientation 12/20 3-9 Well Below Average  Language 85 16 Below Average    Picture Naming 10/10 51-75 Average    Semantic Fluency 4 2 Well Below Average  Attention 82 12 Below Average    Digit Span 8 25 Average    Coding 6 9 Below Average  Delayed Memory 60 <1 Exceptionally Low    List Recall 0/10 <2 Exceptionally Low    List Recognition 18/20 17-25 Below Average to Average    Story Recall 2 <1 Exceptionally Low    Story Recognition 0/12 <1 Exceptionally Low    Figure Recall 1 <1 Exceptionally Low    Figure Recognition 0/8 1 Exceptionally Low       Intellectual Functioning:           Standard Score Percentile   Test of Premorbid Functioning: 92 30 Average       Attention/Executive Function:  Trail Making Test (TMT): Raw Score (T Score) Percentile     Part A 34 secs.,  0 errors (48) 42 Average    Part B Discontinued --- Impaired         Scaled Score Percentile   WAIS-IV Digit Span: 5 5 Well Below Average    Forward 8 25 Average    Backward 8 25 Average    Sequencing 4 2 Well Below Average       D-KEFS Color-Word Interference Test: Raw Score (Scaled Score) Percentile     Color Naming 33 secs. (10) 50 Average    Word Reading 27 secs. (9)  37 Average    Inhibition 105 secs. (5) 5 Well Below Average      Total Errors 11 errors (2) <1 Exceptionally Low    Inhibition/Switching Discontinued --- Impaired      Total Errors --- --- ---       D-KEFS Verbal Fluency Test: Raw Score (Scaled Score) Percentile     Letter Total Correct 24 (7) 16 Below Average    Category Total Correct 27 (8) 25 Average    Category Switching Total Correct 3 (1) <1 Exceptionally Low    Category Switching Accuracy 2 (1) <1 Exceptionally Low    Total Set Loss Errors 2 (10) 50 Average    Total Repetition Errors 10 (3) 1 Exceptionally Low       NAB Executive Functions Module, Form 1: T Score Percentile     Judgment 22 <1 Exceptionally Low       Language:          Verbal Fluency Test: Raw Score (T Score) Percentile     Phonemic Fluency (FAS) 24 (34) 5 Well Below Average    Animal Fluency 8 (22) <1 Exceptionally Low        NAB Language Module, Form 1: T Score Percentile     Auditory Comprehension 27 1 Exceptionally Low    Naming 29/31 (47) 38 Average       Visuospatial/Visuoconstruction:      Raw Score Percentile   Clock Drawing: 9/10 --- Within Normal Limits       Mood and Personality:      Raw Score Percentile   Geriatric Depression Scale: 8 --- Within Normal Limits  Geriatric Anxiety Scale: 17 --- Mild    Somatic 8 --- Mild    Cognitive 5 --- Mild    Affective 4 --- Mild       Additional Questionnaires:      Raw Score Percentile   PROMIS Sleep Disturbance Questionnaire: 20 --- None to Slight   Informed Consent and Coding/Compliance:   Ms. Inman was provided with a verbal description of the nature and purpose of the present neuropsychological evaluation. Also reviewed were the foreseeable risks and/or discomforts and benefits of the procedure, limits of confidentiality, and mandatory reporting requirements of this provider. The patient was given the opportunity to ask questions and receive answers about the evaluation. Oral consent to  participate was provided by the patient.   This evaluation was conducted by Newman Nickels, Ph.D., licensed clinical neuropsychologist. Ms. Grzesiak completed 110 minutes of cognitive testing and scoring, billed as one unit [phone] and three additional units [phone]. Psychometrist Wallace Keller, B.S., assisted Dr. Milbert Coulter with test administration and scoring procedures. As a separate and discrete service, Dr. Milbert Coulter spent a total of 100 minutes in interpretation and report writing billed as one unit [phone] and one unit [phone].

## 2019-10-15 DIAGNOSIS — H53032 Strabismic amblyopia, left eye: Secondary | ICD-10-CM | POA: Diagnosis not present

## 2019-10-15 DIAGNOSIS — H18413 Arcus senilis, bilateral: Secondary | ICD-10-CM | POA: Diagnosis not present

## 2019-10-15 DIAGNOSIS — B0052 Herpesviral keratitis: Secondary | ICD-10-CM | POA: Diagnosis not present

## 2019-10-15 DIAGNOSIS — H53002 Unspecified amblyopia, left eye: Secondary | ICD-10-CM | POA: Diagnosis not present

## 2019-10-20 DIAGNOSIS — H1820 Unspecified corneal edema: Secondary | ICD-10-CM | POA: Diagnosis not present

## 2019-10-20 DIAGNOSIS — B0052 Herpesviral keratitis: Secondary | ICD-10-CM | POA: Diagnosis not present

## 2019-10-20 DIAGNOSIS — H538 Other visual disturbances: Secondary | ICD-10-CM | POA: Diagnosis not present

## 2019-10-20 DIAGNOSIS — Z8719 Personal history of other diseases of the digestive system: Secondary | ICD-10-CM | POA: Diagnosis not present

## 2019-10-20 DIAGNOSIS — H53032 Strabismic amblyopia, left eye: Secondary | ICD-10-CM | POA: Diagnosis not present

## 2019-10-20 DIAGNOSIS — R7401 Elevation of levels of liver transaminase levels: Secondary | ICD-10-CM | POA: Diagnosis not present

## 2019-10-22 ENCOUNTER — Ambulatory Visit: Payer: Medicare HMO | Admitting: Family Medicine

## 2019-10-22 ENCOUNTER — Telehealth: Payer: Self-pay | Admitting: Neurology

## 2019-10-22 NOTE — Progress Notes (Deleted)
PATIENT: Jessica Whitney DOB: 09/09/1945  REASON FOR VISIT: follow up HISTORY FROM: patient  No chief complaint on file.    HISTORY OF PRESENT ILLNESS: Today 10/22/19 Jessica Whitney is a 74 y.o. female here today for follow up for memory loss. Neuropsychology evaluation with Dr Melvyn Novas showed deficits surrounding executive functioning (including working memory), receptive language, and learning and memory and indicative of major neurocognitive disorder/dementia. Etiology was unclear but most concerning for Alzheimer's Dementia. She was taking Aricept but [place] reported 30 pound weight loss and medication was stopped.   HISTORY: (copied from Dr Guadelupe Sabin note on 07/24/2019)  Jessica Whitney is a 74 year old right-handed woman with an underlying spinal stenosis, hypothyroidism, reflux disease, diverticulitis, history of arthritis, anxiety, allergic rhinitis, depression, history of fibromyalgia, osteoporosis and osteopenia, status post lumbar spine surgery, history of epidural steroid injections, history of sigmoid colectomy for complicated diverticulitis, vitamin B12 and vitamin D deficiencies in the past, who presents for follow-up consultation of her memory loss.  The patient is accompanied by her husband today.  I first met her on 04/21/2019 at the request of her primary care PA, at which time she reported a approximately 2-year history of forgetfulness.  Her MMSE was 20 out of 30.  However, she had recent blood work through her primary care office and was noted to have a highly elevated TSH. Her Synthroid was increased and she was also noted to have a low vitamin B12 and was started on B12 injections.  I suggested she follow-up with her primary care PA or physician on a regular basis for these medical issues that can contribute to memory loss.  I also suggested we proceed with a brain MRI.  I talked to them about potentially starting a memory medication such as donepezil. She had a brain MRI without  contrast on 05/30/2019 and I reviewed the results:  IMPRESSION: This MRI of the brain without contrast shows the following: 1. Mild generalized cortical atrophy most noticeable in the insular cortex. 2. Moderate chronic microvascular ischemic changes. None of the foci appear to be acute. 3. Single chronic microhemorrhage in the right temporal lobe. Since there is just one focus, this is unlikely to be clinically significant. 4. There are no acute findings.  We called her with her test results.  Today, 07/24/2019: She reports Feeling about the same.  She admits that she does not drink a whole lot of water, reports that she "hates water". She drinks coffee in the morning and likes to drink soda, about 3 glasses/day on average.  She has not had any falls.  She does not exercise very much.  Her husband reports that she has become more forgetful even since her last visit, she keeps repeating herself and also forgets events such as going to the doctor and having been checked out for a problem that she continues to feel is a problem such as her left ear bothering her.She had a checkup with her primary care PA in November.  I was not able to review the note.  She is not sure if she needed to change her medication, her husband is not fully sure if there was any change in her medication regimen such as the dose of her Synthroid.  She continues to take 100 mcg/day.  The patient's allergies, current medications, family history, past medical history, past social history, past surgical history and problem list were reviewed and updated as appropriate.   Previously:   04/21/2019: (She) reports forgetfulness.  Her husband feels that she has had issues with forgetfulness and repeating herself for the past year or 2. I reviewed your office note from 04/10/2019. She had blood work in your office including CMP, TSH. Her TSH was high at 20.35 at the time. Her BUN was 15, creatinine 1.15. B12 was low at  147 at the time. She was advised to start oral vitamin B12 supplementation and come in for B12 injections every other week. Her husband provides details of her history. She had her first B12 injection last week. She had an increase in her levothyroxine from 75 mcg to 100 mcg daily. The patient reports no family history of dementia. She had 2 sisters, one passed away, her older sister lives in [place]. She has 1 brother but has not had any contact with him for years. She quit smoking in the 90s. Her mother lived to be into her mid 49s and died of cancer, father's medical history is unknown, her parents were divorced. She has 2 children, her son lives with his family in [place], daughter lives in [place]. She drinks caffeine in the form of coffee, about 2 cups/day and tea and soda about 2 servings per day, very little water, about 8 to 16 ounces per day by husband's estimation. She does not exercise in any formal fashion. 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 She has not fallen recently. Her driving is okay, she has limited her driving, her husband has not noticed any issues with her driving.   REVIEW OF SYSTEMS: Out of a complete 14 system review of symptoms, the patient complains only of the following symptoms, and all other reviewed systems are negative.  ALLERGIES: Allergies  Allergen Reactions  . Sulfa Antibiotics Other (See Comments)    Caused mouth sores    HOME MEDICATIONS: Outpatient Medications Prior to Visit  Medication Sig Dispense Refill  . ciprofloxacin (CILOXAN) 0.3 % ophthalmic solution Place 2 drops into the left eye every 4 (four) hours while awake. Administer 1 drop, every 2 hours, while awake, for 2 days. Then 1 drop, every 4 hours, while awake, for the next 5 days. 5 mL 0  . donepezil (ARICEPT) 10 MG tablet Take 1 tablet (10 mg total) by mouth at bedtime.    . DULoxetine HCl 40 MG  CPEP Take 1 tablet by mouth in the morning and at bedtime. 2 tablets per day    . levothyroxine (SYNTHROID) 100 MCG tablet Take 100 mcg by mouth daily before breakfast.    . lidocaine (LIDODERM) 5 % Place 1 patch onto the skin daily. Remove & Discard patch within 12 hours or as directed by MD. Lower back 30 patch 0  . metoprolol tartrate (LOPRESSOR) 25 MG tablet Take 0.5 tablets (12.5 mg total) by mouth 2 (two) times daily. 60 tablet 2  . ondansetron (ZOFRAN ODT) 4 MG disintegrating tablet Take 1 tablet (4 mg total) by mouth every 8 (eight) hours as needed for nausea or vomiting. 30 tablet 0  . pantoprazole (PROTONIX) 40 MG tablet Take 40 mg by mouth every morning.    . vitamin B-12 (CYANOCOBALAMIN) 1000 MCG tablet Take 1,000 mcg by mouth daily.     No facility-administered medications prior to visit.    PAST MEDICAL HISTORY: Past Medical History:  Diagnosis Date  . Acute pancreatitis 08/30/2019  . Arthritis   . Diverticulitis   . Fibromyalgia   . GERD (  gastroesophageal reflux disease)    bucchini  . Hypokalemia 08/30/2019  . Hypothyroidism 08/30/2019  . Major depressive disorder 08/30/2019  . Major neurocognitive disorder 09/22/2019  . PONV (postoperative nausea and vomiting)   . Spinal stenosis   . Vitamin B12 deficiency   . Vitamin D deficiency     PAST SURGICAL HISTORY: Past Surgical History:  Procedure Laterality Date  . APPENDECTOMY    . BACK SURGERY    . bone spur shoulder Bilateral   . BREAST EXCISIONAL BIOPSY Right   . BREAST SURGERY Right    lumpectomy non cancerous  . CHOLECYSTECTOMY    . COLON SURGERY     foot of colon removed - diverticulitis  . EYE SURGERY Left    cross-eye fixed  . JOINT REPLACEMENT Right    knee  . knee arthroscopic Right   . LUMBAR LAMINECTOMY/DECOMPRESSION MICRODISCECTOMY N/A 04/03/2013   Procedure: Lumbar Four-Five Lumbar diskectomy with Coflex;  Surgeon: Faythe Ghee, MD;  Location: [facility];  Service: Neurosurgery;  Laterality: N/A;   L4-5 Lumbar diskectomy with Coflex  . TRIGGER FINGER RELEASE Right    x2  . TUBAL LIGATION      FAMILY HISTORY: No family history on file.  SOCIAL HISTORY: Social History   Socioeconomic History  . Marital status: Married    Spouse name: Not on file  . Number of children: Not on file  . Years of education: 15  . Highest education level: Some college, no degree  Occupational History  . Occupation: Retired    Comment: Consulting civil engineer for 20 years  Tobacco Use  . Smoking status: Former Research scientist (life sciences)  . Smokeless tobacco: Never Used  Substance and Sexual Activity  . Alcohol use: Yes    Comment: very rare glass of wine  . Drug use: No  . Sexual activity: Not on file  Other Topics Concern  . Not on file  Social History Narrative  . Not on file   Social Determinants of Health   Financial Resource Strain:   . Difficulty of Paying Living Expenses:   Food Insecurity:   . Worried About Charity fundraiser in the Last Year:   . Arboriculturist in the Last Year:   Transportation Needs:   . Film/video editor (Medical):   Marland Kitchen Lack of Transportation (Non-Medical):   Physical Activity:   . Days of Exercise per Week:   . Minutes of Exercise per Session:   Stress:   . Feeling of Stress :   Social Connections:   . Frequency of Communication with Friends and Family:   . Frequency of Social Gatherings with Friends and Family:   . Attends Religious Services:   . Active Member of Clubs or Organizations:   . Attends Archivist Meetings:   Marland Kitchen Marital Status:   Intimate Partner Violence:   . Fear of Current or Ex-Partner:   . Emotionally Abused:   Marland Kitchen Physically Abused:   . Sexually Abused:       PHYSICAL EXAM  There were no vitals filed for this visit. There is no height or weight on file to calculate BMI.  Generalized: Well developed, in no acute distress  Cardiology: normal rate and rhythm, no murmur noted Respiratory: clear to auscultation bilaterally    Neurological examination  Mentation: Alert oriented to time, place, history taking. Follows all commands speech and language fluent Cranial nerve II-XII: Pupils were equal round reactive to light. Extraocular movements were full, visual field were  full on confrontational test. Facial sensation and strength were normal. Uvula tongue midline. Head turning and shoulder shrug  were normal and symmetric. Motor: The motor testing reveals 5 over 5 strength of all 4 extremities. Good symmetric motor tone is noted throughout.  Sensory: Sensory testing is intact to soft touch on all 4 extremities. No evidence of extinction is noted.  Coordination: Cerebellar testing reveals good finger-nose-finger and heel-to-shin bilaterally.  Gait and station: Gait is normal. Tandem gait is normal. Romberg is negative. No drift is seen.  Reflexes: Deep tendon reflexes are symmetric and normal bilaterally.   DIAGNOSTIC DATA (LABS, IMAGING, TESTING) - I reviewed patient records, labs, notes, testing and imaging myself where available.  MMSE - Mini Mental State Exam 07/24/2019 04/21/2019  Orientation to time 2 2  Orientation to Place 4 3  Registration 3 3  Attention/ Calculation 2 3  Recall 0 0  Language- name 2 objects 2 2  Language- repeat 1 1  Language- follow 3 step command 3 3  Language- read & follow direction 1 1  Write a sentence 1 1  Copy design 1 1  Total score 20 20     Lab Results  Component Value Date   WBC 17.8 (H) 09/06/2019   HGB 11.2 (L) 09/06/2019   HCT 34.1 (L) 09/06/2019   MCV 88.6 09/06/2019   PLT 242 09/06/2019      Component Value Date/Time   NA 134 (L) 09/06/2019 0444   K 4.2 09/06/2019 0444   CL 103 09/06/2019 0444   CO2 23 09/06/2019 0444   GLUCOSE 109 (H) 09/06/2019 0444   BUN 6 (L) 09/06/2019 0444   CREATININE 0.71 09/06/2019 0444   CALCIUM 7.4 (L) 09/06/2019 0444   PROT 4.9 (L) 09/05/2019 0517   ALBUMIN 2.3 (L) 09/05/2019 0517   AST 48 (H) 09/05/2019 0517   ALT 36  09/05/2019 0517   ALKPHOS 179 (H) 09/05/2019 0517   BILITOT 0.9 09/05/2019 0517   GFRNONAA >60 09/06/2019 0444   GFRAA >60 09/06/2019 0444   No results found for: CHOL, HDL, LDLCALC, LDLDIRECT, TRIG, CHOLHDL No results found for: HGBA1C No results found for: VITAMINB12 Lab Results  Component Value Date   TSH 2.229 09/01/2019       ASSESSMENT AND PLAN 74 y.o. year old female  has a past medical history of Acute pancreatitis (08/30/2019), Arthritis, Diverticulitis, Fibromyalgia, GERD (gastroesophageal reflux disease), Hypokalemia (08/30/2019), Hypothyroidism (08/30/2019), Major depressive disorder (08/30/2019), Major neurocognitive disorder (09/22/2019), PONV (postoperative nausea and vomiting), Spinal stenosis, Vitamin B12 deficiency, and Vitamin D deficiency. here with ***    ICD-10-CM   1. Major neurocognitive disorder ([place])  F01.50        No orders of the defined types were placed in this encounter.    No orders of the defined types were placed in this encounter.     I spent 15 minutes with the patient. 50% of this time was spent counseling and educating patient on plan of care and medications.    Debbora Presto, FNP-C 10/22/2019, 12:46 PM [facility] [address] [place], [place] [address] [phone]

## 2019-10-22 NOTE — Telephone Encounter (Signed)
Paternostro,Robert J(husband on DPR) has called and cancelled the in office appointment for this afternoon, he stated pt was not willing to come to the appointment.  Phone rep gave him the option of switching to a my chart vv he declined stating pt would not likely be willing to participate in the appointment.  Husband is however asking for a call from RN to discuss concerns about pt

## 2019-10-23 DIAGNOSIS — M797 Fibromyalgia: Secondary | ICD-10-CM | POA: Diagnosis not present

## 2019-10-23 DIAGNOSIS — I951 Orthostatic hypotension: Secondary | ICD-10-CM | POA: Diagnosis not present

## 2019-10-23 DIAGNOSIS — F0390 Unspecified dementia without behavioral disturbance: Secondary | ICD-10-CM | POA: Diagnosis not present

## 2019-10-23 DIAGNOSIS — F419 Anxiety disorder, unspecified: Secondary | ICD-10-CM | POA: Diagnosis not present

## 2019-10-23 DIAGNOSIS — M479 Spondylosis, unspecified: Secondary | ICD-10-CM | POA: Diagnosis not present

## 2019-10-23 DIAGNOSIS — K859 Acute pancreatitis without necrosis or infection, unspecified: Secondary | ICD-10-CM | POA: Diagnosis not present

## 2019-10-23 DIAGNOSIS — E860 Dehydration: Secondary | ICD-10-CM | POA: Diagnosis not present

## 2019-10-23 DIAGNOSIS — G8929 Other chronic pain: Secondary | ICD-10-CM | POA: Diagnosis not present

## 2019-10-23 DIAGNOSIS — I10 Essential (primary) hypertension: Secondary | ICD-10-CM | POA: Diagnosis not present

## 2019-10-23 NOTE — Telephone Encounter (Signed)
I called the pt's back. He reports the pt had a bad day on 10/22/2019 and she refused to come for office visit. He reports he would like to reschedule this appointment. I have rescheduled for 12/02/2019 at 930 with mm,NP.

## 2019-10-24 ENCOUNTER — Encounter: Payer: Self-pay | Admitting: Psychology

## 2019-10-24 ENCOUNTER — Other Ambulatory Visit: Payer: Self-pay

## 2019-10-24 ENCOUNTER — Ambulatory Visit (INDEPENDENT_AMBULATORY_CARE_PROVIDER_SITE_OTHER): Payer: Medicare HMO | Admitting: Psychology

## 2019-10-24 DIAGNOSIS — F0150 Vascular dementia without behavioral disturbance: Secondary | ICD-10-CM

## 2019-10-24 DIAGNOSIS — F0390 Unspecified dementia without behavioral disturbance: Secondary | ICD-10-CM

## 2019-10-24 NOTE — Progress Notes (Addendum)
   Neuropsychology Feedback Session Jessica Whitney. [facility] Department of Neurology  Reason for Referral:   Jessica Whitney D32 y.o.right-handed Caucasianfemalereferred by Huston Foley, M.D.,to characterizehercurrent cognitive functioning and assist with diagnostic clarity and treatment planning in the context ofsubjective cognitive decline and concerns surrounding a neurodegenerative illness.  Feedback:   Ms. Jessica Whitney completed a comprehensive neuropsychological evaluation on 10/13/2019. Please refer to that encounter for the full report and recommendations. Briefly, results suggested primary deficits surrounding executive functioning (including working memory), receptive language, and Artist. Performance variability was exhibited across phonemic fluency, semantic fluency (with a majority of performances below expectation), and visuospatial abilities. The etiology for her cognitive decline is unclear. Prominent memory dysfunction with largely amnestic retrieval and poor recognition performances is certainly concerning for Alzheimer's disease. Ongoing executive dysfunction and the discrepancy between semantic and phonemic fluency scores (the former often worse than the latter) is further concerning for this condition. However, she performed well across a list learning recognition task, as well as two confrontation naming assessments, which is inconsistent with the typical Alzheimer's disease presentation. This could reflect that Ms. Jessica Whitney is still in the earlier stages of this illness if indeed present. Previous neuroimaging suggested moderate chronic microvascular ischemic changes, raising the potential for a vascular contribution to her current cognitive profile. It is also worth noting the potential for current performances to be mildly attenuated given her recent hospitalization and residual physical symptoms. However, memory deficits are believed to be above and  beyond these symptoms alone.  Ms. Jessica Whitney was accompanied by her husband during the current telephone call. Ms. Jessica Whitney and her husband were located at their residence, while the clinician was located within the office. Content of the current session focused on the results of her neuropsychological evaluation. Ms. Jessica Whitney and her husband were given the opportunity to ask questions and their questions were answered. They were encouraged to reach out should additional questions arise. A copy of her report was mailed at the conclusion of the visit.      Between 16 and 37 minutes were spent conducting the current feedback session with Ms. Jessica Whitney, billed as one unit [phone].

## 2019-10-27 DIAGNOSIS — H53032 Strabismic amblyopia, left eye: Secondary | ICD-10-CM | POA: Diagnosis not present

## 2019-10-27 DIAGNOSIS — H538 Other visual disturbances: Secondary | ICD-10-CM | POA: Diagnosis not present

## 2019-10-27 DIAGNOSIS — B0052 Herpesviral keratitis: Secondary | ICD-10-CM | POA: Diagnosis not present

## 2019-10-27 DIAGNOSIS — H1820 Unspecified corneal edema: Secondary | ICD-10-CM | POA: Diagnosis not present

## 2019-10-30 DIAGNOSIS — F0390 Unspecified dementia without behavioral disturbance: Secondary | ICD-10-CM | POA: Diagnosis not present

## 2019-10-30 DIAGNOSIS — I10 Essential (primary) hypertension: Secondary | ICD-10-CM | POA: Diagnosis not present

## 2019-10-30 DIAGNOSIS — F419 Anxiety disorder, unspecified: Secondary | ICD-10-CM | POA: Diagnosis not present

## 2019-10-30 DIAGNOSIS — K859 Acute pancreatitis without necrosis or infection, unspecified: Secondary | ICD-10-CM | POA: Diagnosis not present

## 2019-10-30 DIAGNOSIS — I951 Orthostatic hypotension: Secondary | ICD-10-CM | POA: Diagnosis not present

## 2019-10-30 DIAGNOSIS — M797 Fibromyalgia: Secondary | ICD-10-CM | POA: Diagnosis not present

## 2019-10-30 DIAGNOSIS — M479 Spondylosis, unspecified: Secondary | ICD-10-CM | POA: Diagnosis not present

## 2019-10-30 DIAGNOSIS — E860 Dehydration: Secondary | ICD-10-CM | POA: Diagnosis not present

## 2019-10-30 DIAGNOSIS — G8929 Other chronic pain: Secondary | ICD-10-CM | POA: Diagnosis not present

## 2019-11-04 DIAGNOSIS — M797 Fibromyalgia: Secondary | ICD-10-CM | POA: Diagnosis not present

## 2019-11-04 DIAGNOSIS — F419 Anxiety disorder, unspecified: Secondary | ICD-10-CM | POA: Diagnosis not present

## 2019-11-04 DIAGNOSIS — E860 Dehydration: Secondary | ICD-10-CM | POA: Diagnosis not present

## 2019-11-04 DIAGNOSIS — G8929 Other chronic pain: Secondary | ICD-10-CM | POA: Diagnosis not present

## 2019-11-04 DIAGNOSIS — F0390 Unspecified dementia without behavioral disturbance: Secondary | ICD-10-CM | POA: Diagnosis not present

## 2019-11-04 DIAGNOSIS — M479 Spondylosis, unspecified: Secondary | ICD-10-CM | POA: Diagnosis not present

## 2019-11-04 DIAGNOSIS — K859 Acute pancreatitis without necrosis or infection, unspecified: Secondary | ICD-10-CM | POA: Diagnosis not present

## 2019-11-04 DIAGNOSIS — I951 Orthostatic hypotension: Secondary | ICD-10-CM | POA: Diagnosis not present

## 2019-11-04 DIAGNOSIS — I10 Essential (primary) hypertension: Secondary | ICD-10-CM | POA: Diagnosis not present

## 2019-11-14 DIAGNOSIS — M8580 Other specified disorders of bone density and structure, unspecified site: Secondary | ICD-10-CM | POA: Diagnosis not present

## 2019-11-14 DIAGNOSIS — M810 Age-related osteoporosis without current pathological fracture: Secondary | ICD-10-CM | POA: Diagnosis not present

## 2019-11-14 DIAGNOSIS — E7800 Pure hypercholesterolemia, unspecified: Secondary | ICD-10-CM | POA: Diagnosis not present

## 2019-11-14 DIAGNOSIS — E039 Hypothyroidism, unspecified: Secondary | ICD-10-CM | POA: Diagnosis not present

## 2019-11-14 DIAGNOSIS — N183 Chronic kidney disease, stage 3 unspecified: Secondary | ICD-10-CM | POA: Diagnosis not present

## 2019-11-14 DIAGNOSIS — F322 Major depressive disorder, single episode, severe without psychotic features: Secondary | ICD-10-CM | POA: Diagnosis not present

## 2019-11-19 DIAGNOSIS — H53032 Strabismic amblyopia, left eye: Secondary | ICD-10-CM | POA: Diagnosis not present

## 2019-11-19 DIAGNOSIS — B0052 Herpesviral keratitis: Secondary | ICD-10-CM | POA: Diagnosis not present

## 2019-11-19 DIAGNOSIS — H538 Other visual disturbances: Secondary | ICD-10-CM | POA: Diagnosis not present

## 2019-11-19 DIAGNOSIS — H1820 Unspecified corneal edema: Secondary | ICD-10-CM | POA: Diagnosis not present

## 2019-12-02 ENCOUNTER — Encounter: Payer: Self-pay | Admitting: Adult Health

## 2019-12-02 ENCOUNTER — Ambulatory Visit: Payer: Medicare HMO | Admitting: Adult Health

## 2019-12-02 VITALS — BP 104/68 | HR 94 | Ht 63.0 in | Wt 114.0 lb

## 2019-12-02 DIAGNOSIS — F0150 Vascular dementia without behavioral disturbance: Secondary | ICD-10-CM

## 2019-12-02 DIAGNOSIS — R413 Other amnesia: Secondary | ICD-10-CM | POA: Diagnosis not present

## 2019-12-02 DIAGNOSIS — F0390 Unspecified dementia without behavioral disturbance: Secondary | ICD-10-CM

## 2019-12-02 MED ORDER — MEMANTINE HCL 28 X 5 MG & 21 X 10 MG PO TABS: ORAL_TABLET | ORAL | 0 refills | Status: DC

## 2019-12-02 NOTE — Progress Notes (Addendum)
PATIENT: Jessica Whitney DOB: 10-10-1945  REASON FOR VISIT: follow up HISTORY FROM: patient  HISTORY OF PRESENT ILLNESS: Today 12/02/19:  Ms. Limburg is a 74 year old female with a history of dementia.  She returns today for follow-up.  She is here today with her husband and her son.  They feel that over the last year her memory has gotten worse.  She lives at home with her husband.  She is able to complete all ADLs independently.  She no longer operates a motor vehicle.  She no longer cooks but the husband does not feel that this its due to her memory.  He reports that at bedtime sometimes she forgets that she is married and would ask him to sleep in another room.  Her son also notes that sometimes she may not remember things about her grandchildren.  She was on Aricept but was taken off of this medication due to weight loss.  Although the family reports that she had acute pancreatitis and that is what contributed to the weight loss she returns today for an evaluation.   HISTORY (copied from Dr. Guadelupe Sabin note)  07/24/2019: She reports Feeling about the same.  She admits that she does not drink a whole lot of water, reports that she "hates water". She drinks coffee in the morning and likes to drink soda, about 3 glasses/day on average.  She has not had any falls.  She does not exercise very much.  Her husband reports that she has become more forgetful even since her last visit, she keeps repeating herself and also forgets events such as going to the doctor and having been checked out for a problem that she continues to feel is a problem such as her left ear bothering her.She had a checkup with her primary care PA in November.  I was not able to review the note.  She is not sure if she needed to change her medication, her husband is not fully sure if there was any change in her medication regimen such as the dose of her Synthroid.  She continues to take 100 mcg/day.  REVIEW OF SYSTEMS: Out of a  complete 14 system review of symptoms, the patient complains only of the following symptoms, and all other reviewed systems are negative.  See HPI  ALLERGIES: Allergies  Allergen Reactions  . Sulfa Antibiotics Other (See Comments)    Caused mouth sores    HOME MEDICATIONS: Outpatient Medications Prior to Visit  Medication Sig Dispense Refill  . donepezil (ARICEPT) 10 MG tablet Take 1 tablet (10 mg total) by mouth at bedtime.    . DULoxetine HCl 40 MG CPEP Take 1 tablet by mouth in the morning and at bedtime. 2 tablets per day    . levothyroxine (SYNTHROID) 100 MCG tablet Take 100 mcg by mouth daily before breakfast.    . lidocaine (LIDODERM) 5 % Place 1 patch onto the skin daily. Remove & Discard patch within 12 hours or as directed by MD. Lower back 30 patch 0  . metoprolol tartrate (LOPRESSOR) 25 MG tablet Take 0.5 tablets (12.5 mg total) by mouth 2 (two) times daily. 60 tablet 2  . ondansetron (ZOFRAN ODT) 4 MG disintegrating tablet Take 1 tablet (4 mg total) by mouth every 8 (eight) hours as needed for nausea or vomiting. 30 tablet 0  . pantoprazole (PROTONIX) 40 MG tablet Take 40 mg by mouth every morning.    . valACYclovir (VALTREX) 500 MG tablet Take by mouth 2 (two)  times daily.    . vitamin B-12 (CYANOCOBALAMIN) 1000 MCG tablet Take 1,000 mcg by mouth daily.    . ciprofloxacin (CILOXAN) 0.3 % ophthalmic solution Place 2 drops into the left eye every 4 (four) hours while awake. Administer 1 drop, every 2 hours, while awake, for 2 days. Then 1 drop, every 4 hours, while awake, for the next 5 days. 5 mL 0   No facility-administered medications prior to visit.    PAST MEDICAL HISTORY: Past Medical History:  Diagnosis Date  . Acute pancreatitis 08/30/2019  . Arthritis   . Diverticulitis   . Fibromyalgia   . GERD (gastroesophageal reflux disease)    bucchini  . Hypokalemia 08/30/2019  . Hypothyroidism 08/30/2019  . Major depressive disorder 08/30/2019  . Major neurocognitive  disorder 09/22/2019  . PONV (postoperative nausea and vomiting)   . Spinal stenosis   . Vitamin B12 deficiency   . Vitamin D deficiency     PAST SURGICAL HISTORY: Past Surgical History:  Procedure Laterality Date  . APPENDECTOMY    . BACK SURGERY    . bone spur shoulder Bilateral   . BREAST EXCISIONAL BIOPSY Right   . BREAST SURGERY Right    lumpectomy non cancerous  . CHOLECYSTECTOMY    . COLON SURGERY     foot of colon removed - diverticulitis  . EYE SURGERY Left    cross-eye fixed  . JOINT REPLACEMENT Right    knee  . knee arthroscopic Right   . LUMBAR LAMINECTOMY/DECOMPRESSION MICRODISCECTOMY N/A 04/03/2013   Procedure: Lumbar Four-Five Lumbar diskectomy with Coflex;  Surgeon: Reinaldo Meeker, MD;  Location: [facility];  Service: Neurosurgery;  Laterality: N/A;  L4-5 Lumbar diskectomy with Coflex  . TRIGGER FINGER RELEASE Right    x2  . TUBAL LIGATION      FAMILY HISTORY: No family history on file.  SOCIAL HISTORY: Social History   Socioeconomic History  . Marital status: Married    Spouse name: Not on file  . Number of children: Not on file  . Years of education: 46  . Highest education level: Some college, no degree  Occupational History  . Occupation: Retired    Comment: Geophysicist/field seismologist for 20 years  Tobacco Use  . Smoking status: Former Games developer  . Smokeless tobacco: Never Used  Substance and Sexual Activity  . Alcohol use: Yes    Comment: very rare glass of wine  . Drug use: No  . Sexual activity: Not on file  Other Topics Concern  . Not on file  Social History Narrative  . Not on file   Social Determinants of Health   Financial Resource Strain:   . Difficulty of Paying Living Expenses:   Food Insecurity:   . Worried About Programme researcher, broadcasting/film/video in the Last Year:   . Barista in the Last Year:   Transportation Needs:   . Freight forwarder (Medical):   Marland Kitchen Lack of Transportation (Non-Medical):   Physical Activity:   . Days of  Exercise per Week:   . Minutes of Exercise per Session:   Stress:   . Feeling of Stress :   Social Connections:   . Frequency of Communication with Friends and Family:   . Frequency of Social Gatherings with Friends and Family:   . Attends Religious Services:   . Active Member of Clubs or Organizations:   . Attends Banker Meetings:   Marland Kitchen Marital Status:   Intimate Partner Violence:   .  Fear of Current or Ex-Partner:   . Emotionally Abused:   Marland Kitchen Physically Abused:   . Sexually Abused:       PHYSICAL EXAM  Vitals:   12/02/19 0923  BP: 104/68  Pulse: 94  Weight: 114 lb (51.7 kg)  Height: 5\' 3"  (1.6 m)   Body mass index is 20.19 kg/m.   MMSE - Mini Mental State Exam 12/02/2019 07/24/2019 04/21/2019  Orientation to time 0 2 2  Orientation to Place 5 4 3   Registration 3 3 3   Attention/ Calculation 1 2 3   Recall 0 0 0  Language- name 2 objects 2 2 2   Language- repeat 1 1 1   Language- follow 3 step command 3 3 3   Language- read & follow direction 1 1 1   Write a sentence 1 1 1   Copy design 1 1 1   Copy design-comments 5 animals - -  Total score 18 20 20      Generalized: Well developed, in no acute distress   Neurological examination  Mentation: Alert oriented to time, place, history taking. Follows all commands speech and language fluent Cranial nerve II-XII: Pupils were equal round reactive to light. Extraocular movements were full, visual field were full on confrontational test. Head turning and shoulder shrug  were normal and symmetric. Motor: The motor testing reveals 5 over 5 strength of all 4 extremities. Good symmetric motor tone is noted throughout.  Sensory: Sensory testing is intact to soft touch on all 4 extremities. No evidence of extinction is noted.  Coordination: Cerebellar testing reveals good finger-nose-finger and heel-to-shin bilaterally.  Gait and station: Gait is normal. Reflexes: Deep tendon reflexes are symmetric and normal bilaterally.     DIAGNOSTIC DATA (LABS, IMAGING, TESTING) - I reviewed patient records, labs, notes, testing and imaging myself where available.  Lab Results  Component Value Date   WBC 17.8 (H) 09/06/2019   HGB 11.2 (L) 09/06/2019   HCT 34.1 (L) 09/06/2019   MCV 88.6 09/06/2019   PLT 242 09/06/2019      Component Value Date/Time   NA 134 (L) 09/06/2019 0444   K 4.2 09/06/2019 0444   CL 103 09/06/2019 0444   CO2 23 09/06/2019 0444   GLUCOSE 109 (H) 09/06/2019 0444   BUN 6 (L) 09/06/2019 0444   CREATININE 0.71 09/06/2019 0444   CALCIUM 7.4 (L) 09/06/2019 0444   PROT 4.9 (L) 09/05/2019 0517   ALBUMIN 2.3 (L) 09/05/2019 0517   AST 48 (H) 09/05/2019 0517   ALT 36 09/05/2019 0517   ALKPHOS 179 (H) 09/05/2019 0517   BILITOT 0.9 09/05/2019 0517   GFRNONAA >60 09/06/2019 0444   GFRAA >60 09/06/2019 0444   No results found for: CHOL, HDL, LDLCALC, LDLDIRECT, TRIG, CHOLHDL No results found for: 09/08/2019 No results found for: VITAMINB12 Lab Results  Component Value Date   TSH 2.229 09/01/2019      ASSESSMENT AND PLAN 74 y.o. year old female  has a past medical history of Acute pancreatitis (08/30/2019), Arthritis, Diverticulitis, Fibromyalgia, GERD (gastroesophageal reflux disease), Hypokalemia (08/30/2019), Hypothyroidism (08/30/2019), Major depressive disorder (08/30/2019), Major neurocognitive disorder (09/22/2019), PONV (postoperative nausea and vomiting), Spinal stenosis, Vitamin B12 deficiency, and Vitamin D deficiency. here with:  1.  Memory disturbance   MMSE 18/30 previously 20/30  Start Namenda titration pack  Reviewed potential side effects  Advised if symptoms worsen or she develops new symptoms they should let 09/08/2019 know  Follow-up in 6 months or sooner if needed   I spent 30 minutes of face-to-face and  non-face-to-face time with patient.  This included previsit chart review, lab review, study review, order entry, electronic health record documentation, patient education.  Butch Penny, MSN, NP-C 12/02/2019, 9:53 AM [facility] [address], [address] [place], [place] [address] [phone]  I reviewed the above note and documentation by the Nurse Practitioner and agree with the history, exam, assessment and plan as outlined above. I was available for consultation. Huston Foley, MD, PhD [facility])

## 2019-12-02 NOTE — Patient Instructions (Addendum)
Your Plan:  Start Namenda titration pack At the beginning of week 4 please call for new prescription  If your symptoms worsen or you develop new symptoms please let us know.   Thank you for coming to see [place] at [facility]. I hope we have been able to provide you high quality care today.  You may receive a patient satisfaction survey over the next few weeks. We would appreciate your feedback and comments so that we may continue to improve ourselves and the health of our patients.

## 2019-12-24 ENCOUNTER — Other Ambulatory Visit: Payer: Self-pay | Admitting: Adult Health

## 2019-12-25 DIAGNOSIS — E7800 Pure hypercholesterolemia, unspecified: Secondary | ICD-10-CM | POA: Diagnosis not present

## 2019-12-25 DIAGNOSIS — M8580 Other specified disorders of bone density and structure, unspecified site: Secondary | ICD-10-CM | POA: Diagnosis not present

## 2019-12-25 DIAGNOSIS — N183 Chronic kidney disease, stage 3 unspecified: Secondary | ICD-10-CM | POA: Diagnosis not present

## 2019-12-25 DIAGNOSIS — E039 Hypothyroidism, unspecified: Secondary | ICD-10-CM | POA: Diagnosis not present

## 2019-12-25 DIAGNOSIS — M810 Age-related osteoporosis without current pathological fracture: Secondary | ICD-10-CM | POA: Diagnosis not present

## 2019-12-25 DIAGNOSIS — F322 Major depressive disorder, single episode, severe without psychotic features: Secondary | ICD-10-CM | POA: Diagnosis not present

## 2019-12-30 ENCOUNTER — Telehealth: Payer: Self-pay

## 2019-12-30 NOTE — Telephone Encounter (Signed)
Pt was last seen by you on 12/02/19  Is a new appt needed?

## 2019-12-30 NOTE — Telephone Encounter (Signed)
Pt's husband is calling to give progress on his wife. Mr. Manner said she had declined dramatically since her last visit 4 weeks ago. Pt's husband is very concerned and would like to speak Megan or schedule a f/u appt.

## 2020-01-01 NOTE — Telephone Encounter (Signed)
LVM to call back to schedule appt   Per Aundra Millet NP, it can be a mychart visit as well

## 2020-01-01 NOTE — Telephone Encounter (Signed)
Ok to schedule appointment or mychart visit

## 2020-01-01 NOTE — Telephone Encounter (Signed)
Appt made

## 2020-01-05 ENCOUNTER — Telehealth: Payer: Self-pay | Admitting: Adult Health

## 2020-01-05 MED ORDER — MEMANTINE HCL 5 MG PO TABS: ORAL_TABLET | ORAL | 1 refills | Status: DC

## 2020-01-05 NOTE — Telephone Encounter (Signed)
Has been off for 5-6 days now.   Per MM/NP will advance slowly.  Take 5mg  po qhs for one week then increase to 5mg  po bid.  Prescripiton escribed.

## 2020-01-05 NOTE — Telephone Encounter (Signed)
I called pts husband.  He wanted to know what to do about giving the memantine to pt again. He stated that he gave the last 3-4 wks  Doses both in the mid day.  He stated the pt was sleepy for 2-3 days waking in 2-3pm and staying awake til 2-3 am .  Not knowing children, or there 50th wedding anniversary.  Not sure if was SE of medication.  Wanted to know if needed to start titration pack over or what.  Please advise.

## 2020-01-05 NOTE — Telephone Encounter (Signed)
Is she still taking it? If so what dose are they at?

## 2020-01-05 NOTE — Telephone Encounter (Signed)
Pt's husband called needing to speak to RN to discuss her memantine (NAMENDA TITRATION PAK) tablet pack  He states that he had called before and no one has called him back. Please advise.

## 2020-01-14 DIAGNOSIS — H1820 Unspecified corneal edema: Secondary | ICD-10-CM | POA: Diagnosis not present

## 2020-01-14 DIAGNOSIS — H538 Other visual disturbances: Secondary | ICD-10-CM | POA: Diagnosis not present

## 2020-01-14 DIAGNOSIS — B0052 Herpesviral keratitis: Secondary | ICD-10-CM | POA: Diagnosis not present

## 2020-01-27 ENCOUNTER — Other Ambulatory Visit: Payer: Self-pay | Admitting: Adult Health

## 2020-01-28 ENCOUNTER — Telehealth: Payer: Self-pay | Admitting: Adult Health

## 2020-01-28 MED ORDER — MEMANTINE HCL 5 MG PO TABS: ORAL_TABLET | ORAL | 3 refills | Status: DC

## 2020-01-28 NOTE — Telephone Encounter (Signed)
Spoke to husband, pt maintaining 120lbs. Taking memantine 5mg  po bid would like to go to [facility] mail.  90 day done.  Pt still progressive memory loss, (not realizing him at times,).  I relayed dementia progressive disease.  Acute changes could be infection (uti).   May call [facility] re: diagnosis. He verbalized understanding.  Appreciated call.

## 2020-01-28 NOTE — Telephone Encounter (Signed)
Pt's husband Molly Maduro called wanting to speak to RN regarding the pt's memantine (NAMENDA) 5 MG tablet Please advise.

## 2020-02-28 ENCOUNTER — Other Ambulatory Visit: Payer: Self-pay | Admitting: Adult Health

## 2020-03-15 ENCOUNTER — Telehealth: Payer: Self-pay | Admitting: *Deleted

## 2020-03-15 NOTE — Telephone Encounter (Signed)
I called and LM on pts mobile, then spoke to husband and he would like to come in 1400 03-16-20 made appt for them. Arrive 1330.

## 2020-03-16 ENCOUNTER — Ambulatory Visit: Payer: Medicare HMO | Admitting: Adult Health

## 2020-03-16 ENCOUNTER — Encounter: Payer: Self-pay | Admitting: Adult Health

## 2020-03-16 VITALS — BP 103/68 | HR 81 | Ht 63.0 in | Wt 116.6 lb

## 2020-03-16 DIAGNOSIS — F0390 Unspecified dementia without behavioral disturbance: Secondary | ICD-10-CM

## 2020-03-16 MED ORDER — MEMANTINE HCL 10 MG PO TABS: 10.0000 mg | ORAL_TABLET | Freq: Two times a day (BID) | ORAL | 3 refills | Status: DC

## 2020-03-16 MED ORDER — MEMANTINE HCL ER 14 MG PO CP24: 14.0000 mg | ORAL_CAPSULE | Freq: Every day | ORAL | 5 refills | Status: DC

## 2020-03-16 NOTE — Progress Notes (Addendum)
PATIENT: Jessica GingerMarsha C Whitney DOB: January 29, 1946  REASON FOR VISIT: follow up HISTORY FROM: patient  HISTORY OF PRESENT ILLNESS: Today 03/16/20: Jessica Whitney is a 74 year old female with a history of dementia.  She returns today for follow-up.  Her husband is with her today.  He feels that her memory has gotten worse.  He states that there is been a gradual decline over the last several months.  She is still able to complete all ADLs independently.  She does not operate a motor vehicle.  He states that she tends to fall asleep on the couch and sleeps in late the next day.  Patient reports good appetite.  Denies any change in mood or behavior.  No hallucinations or delusions.  Has reports that she can be repetitive at times.  She is currently taking Namenda 5 mg twice a day   HISTORY 12/02/19:  Jessica Whitney is a 74 year old female with a history of dementia.  She returns today for follow-up.  She is here today with her husband and her son.  They feel that over the last year her memory has gotten worse.  She lives at home with her husband.  She is able to complete all ADLs independently.  She no longer operates a motor vehicle.  She no longer cooks but the husband does not feel that this its due to her memory.  He reports that at bedtime sometimes she forgets that she is married and would ask him to sleep in another room.  Her son also notes that sometimes she may not remember things about her grandchildren.  She was on Aricept but was taken off of this medication due to weight loss.  Although the family reports that she had acute pancreatitis and that is what contributed to the weight loss she returns today for an evaluation.  REVIEW OF SYSTEMS: Out of a complete 14 system review of symptoms, the patient complains only of the following symptoms, and all other reviewed systems are negative.  See HPI  ALLERGIES: Allergies  Allergen Reactions  . Sulfa Antibiotics Other (See Comments)    Caused mouth  sores    HOME MEDICATIONS: Outpatient Medications Prior to Visit  Medication Sig Dispense Refill  . donepezil (ARICEPT) 10 MG tablet Take 1 tablet (10 mg total) by mouth at bedtime.    . DULoxetine HCl 40 MG CPEP Take 1 tablet by mouth in the morning and at bedtime. 2 tablets per day    . levothyroxine (SYNTHROID) 100 MCG tablet Take 100 mcg by mouth daily before breakfast.    . lidocaine (LIDODERM) 5 % Place 1 patch onto the skin daily. Remove & Discard patch within 12 hours or as directed by MD. Lower back 30 patch 0  . memantine (NAMENDA) 5 MG tablet Take 5mg  by mouth once daily at night for once week, then increase to 5mg  by mouth twice daily 180 tablet 3  . metoprolol tartrate (LOPRESSOR) 25 MG tablet Take 0.5 tablets (12.5 mg total) by mouth 2 (two) times daily. 60 tablet 2  . ondansetron (ZOFRAN ODT) 4 MG disintegrating tablet Take 1 tablet (4 mg total) by mouth every 8 (eight) hours as needed for nausea or vomiting. 30 tablet 0  . pantoprazole (PROTONIX) 40 MG tablet Take 40 mg by mouth every morning.    . valACYclovir (VALTREX) 500 MG tablet Take by mouth 2 (two) times daily.    . vitamin B-12 (CYANOCOBALAMIN) 1000 MCG tablet Take 1,000 mcg by mouth daily.  No facility-administered medications prior to visit.    PAST MEDICAL HISTORY: Past Medical History:  Diagnosis Date  . Acute pancreatitis 08/30/2019  . Arthritis   . Diverticulitis   . Fibromyalgia   . GERD (gastroesophageal reflux disease)    bucchini  . Hypokalemia 08/30/2019  . Hypothyroidism 08/30/2019  . Major depressive disorder 08/30/2019  . Major neurocognitive disorder 09/22/2019  . PONV (postoperative nausea and vomiting)   . Spinal stenosis   . Vitamin B12 deficiency   . Vitamin D deficiency     PAST SURGICAL HISTORY: Past Surgical History:  Procedure Laterality Date  . APPENDECTOMY    . BACK SURGERY    . bone spur shoulder Bilateral   . BREAST EXCISIONAL BIOPSY Right   . BREAST SURGERY Right     lumpectomy non cancerous  . CHOLECYSTECTOMY    . COLON SURGERY     foot of colon removed - diverticulitis  . EYE SURGERY Left    cross-eye fixed  . JOINT REPLACEMENT Right    knee  . knee arthroscopic Right   . LUMBAR LAMINECTOMY/DECOMPRESSION MICRODISCECTOMY N/A 04/03/2013   Procedure: Lumbar Four-Five Lumbar diskectomy with Coflex;  Surgeon: Reinaldo Meeker, MD;  Location: [facility];  Service: Neurosurgery;  Laterality: N/A;  L4-5 Lumbar diskectomy with Coflex  . TRIGGER FINGER RELEASE Right    x2  . TUBAL LIGATION      FAMILY HISTORY: No family history on file.  SOCIAL HISTORY: Social History   Socioeconomic History  . Marital status: Married    Spouse name: Not on file  . Number of children: Not on file  . Years of education: 26  . Highest education level: Some college, no degree  Occupational History  . Occupation: Retired    Comment: Geophysicist/field seismologist for 20 years  Tobacco Use  . Smoking status: Former Games developer  . Smokeless tobacco: Never Used  Substance and Sexual Activity  . Alcohol use: Yes    Comment: very rare glass of wine  . Drug use: No  . Sexual activity: Not on file  Other Topics Concern  . Not on file  Social History Narrative  . Not on file   Social Determinants of Health   Financial Resource Strain:   . Difficulty of Paying Living Expenses: Not on file  Food Insecurity:   . Worried About Programme researcher, broadcasting/film/video in the Last Year: Not on file  . Ran Out of Food in the Last Year: Not on file  Transportation Needs:   . Lack of Transportation (Medical): Not on file  . Lack of Transportation (Non-Medical): Not on file  Physical Activity:   . Days of Exercise per Week: Not on file  . Minutes of Exercise per Session: Not on file  Stress:   . Feeling of Stress : Not on file  Social Connections:   . Frequency of Communication with Friends and Family: Not on file  . Frequency of Social Gatherings with Friends and Family: Not on file  . Attends  Religious Services: Not on file  . Active Member of Clubs or Organizations: Not on file  . Attends Banker Meetings: Not on file  . Marital Status: Not on file  Intimate Partner Violence:   . Fear of Current or Ex-Partner: Not on file  . Emotionally Abused: Not on file  . Physically Abused: Not on file  . Sexually Abused: Not on file      PHYSICAL EXAM  Vitals:   03/16/20  1414  BP: 103/68  Pulse: 81  Weight: 116 lb 9.6 oz (52.9 kg)  Height: 5\' 3"  (1.6 m)   Body mass index is 20.65 kg/m.   MMSE - Mini Mental State Exam 12/02/2019 07/24/2019 04/21/2019  Orientation to time 0 2 2  Orientation to Place 5 4 3   Registration 3 3 3   Attention/ Calculation 1 2 3   Recall 0 0 0  Language- name 2 objects 2 2 2   Language- repeat 1 1 1   Language- follow 3 step command 3 3 3   Language- read & follow direction 1 1 1   Write a sentence 1 1 1   Copy design 1 1 1   Copy design-comments 5 animals - -  Total score 18 20 20      Generalized: Well developed, in no acute distress   Neurological examination  Mentation: Alert oriented to time, place, history taking. Follows all commands speech and language fluent Cranial nerve II-XII: Pupils were equal round reactive to light. Extraocular movements were full, visual field were full on confrontational test. Facial sensation and strength were normal. Uvula tongue midline. Head turning and shoulder shrug  were normal and symmetric. Motor: The motor testing reveals 5 over 5 strength of all 4 extremities. Good symmetric motor tone is noted throughout.  Sensory: Sensory testing is intact to soft touch on all 4 extremities. No evidence of extinction is noted.  Coordination: Cerebellar testing reveals good finger-nose-finger and heel-to-shin bilaterally.  Gait and station: Gait is normal. Tandem gait is normal. Romberg is negative. No drift is seen.  Reflexes: Deep tendon reflexes are symmetric and normal bilaterally.   DIAGNOSTIC DATA  (LABS, IMAGING, TESTING) - I reviewed patient records, labs, notes, testing and imaging myself where available.  Lab Results  Component Value Date   WBC 17.8 (H) 09/06/2019   HGB 11.2 (L) 09/06/2019   HCT 34.1 (L) 09/06/2019   MCV 88.6 09/06/2019   PLT 242 09/06/2019      Component Value Date/Time   NA 134 (L) 09/06/2019 0444   K 4.2 09/06/2019 0444   CL 103 09/06/2019 0444   CO2 23 09/06/2019 0444   GLUCOSE 109 (H) 09/06/2019 0444   BUN 6 (L) 09/06/2019 0444   CREATININE 0.71 09/06/2019 0444   CALCIUM 7.4 (L) 09/06/2019 0444   PROT 4.9 (L) 09/05/2019 0517   ALBUMIN 2.3 (L) 09/05/2019 0517   AST 48 (H) 09/05/2019 0517   ALT 36 09/05/2019 0517   ALKPHOS 179 (H) 09/05/2019 0517   BILITOT 0.9 09/05/2019 0517   GFRNONAA >60 09/06/2019 0444   GFRAA >60 09/06/2019 0444    Lab Results  Component Value Date   TSH 2.229 09/01/2019      ASSESSMENT AND PLAN 74 y.o. year old female  has a past medical history of Acute pancreatitis (08/30/2019), Arthritis, Diverticulitis, Fibromyalgia, GERD (gastroesophageal reflux disease), Hypokalemia (08/30/2019), Hypothyroidism (08/30/2019), Major depressive disorder (08/30/2019), Major neurocognitive disorder (09/22/2019), PONV (postoperative nausea and vomiting), Spinal stenosis, Vitamin B12 deficiency, and Vitamin D deficiency. here with :  1.  Dementia   Memory score MMSE 15/30 previously 18/30  Increase Namenda to 10 mg twice a day  FU 6 months or sooner if needed   I spent 25 minutes of face-to-face and non-face-to-face time with patient.  This included previsit chart review, lab review, study review, order entry, electronic health record documentation, patient education.  11/05/2019, MSN, NP-C 03/16/2020, 12:37 PM [facility] [address] [place], 11/01/2019 [phone]  I  reviewed the above note and documentation by the Nurse Practitioner and agree with the history, exam, assessment and plan as  outlined above. I was available for consultation. Huston Foley, MD, PhD [facility])

## 2020-03-16 NOTE — Patient Instructions (Addendum)
Your Plan:  Increase Namenda to 10 mg twice a day If your symptoms worsen or you develop new symptoms please let us know.    Thank you for coming to see [place] at [facility]. I hope we have been able to provide you high quality care today.  You may receive a patient satisfaction survey over the next few weeks. We would appreciate your feedback and comments so that we may continue to improve ourselves and the health of our patients.

## 2020-04-09 DIAGNOSIS — Z23 Encounter for immunization: Secondary | ICD-10-CM | POA: Diagnosis not present

## 2020-04-12 DIAGNOSIS — F322 Major depressive disorder, single episode, severe without psychotic features: Secondary | ICD-10-CM | POA: Diagnosis not present

## 2020-04-12 DIAGNOSIS — E039 Hypothyroidism, unspecified: Secondary | ICD-10-CM | POA: Diagnosis not present

## 2020-04-12 DIAGNOSIS — M810 Age-related osteoporosis without current pathological fracture: Secondary | ICD-10-CM | POA: Diagnosis not present

## 2020-04-12 DIAGNOSIS — M8580 Other specified disorders of bone density and structure, unspecified site: Secondary | ICD-10-CM | POA: Diagnosis not present

## 2020-04-12 DIAGNOSIS — E7800 Pure hypercholesterolemia, unspecified: Secondary | ICD-10-CM | POA: Diagnosis not present

## 2020-04-12 DIAGNOSIS — N183 Chronic kidney disease, stage 3 unspecified: Secondary | ICD-10-CM | POA: Diagnosis not present

## 2020-06-03 ENCOUNTER — Other Ambulatory Visit: Payer: Self-pay

## 2020-06-03 ENCOUNTER — Ambulatory Visit: Payer: Medicare HMO | Admitting: Adult Health

## 2020-06-03 ENCOUNTER — Telehealth: Payer: Self-pay | Admitting: Adult Health

## 2020-06-03 VITALS — BP 118/62 | HR 64 | Ht 63.0 in | Wt 118.0 lb

## 2020-06-03 DIAGNOSIS — F0390 Unspecified dementia without behavioral disturbance: Secondary | ICD-10-CM

## 2020-06-03 NOTE — Progress Notes (Addendum)
PATIENT: Jessica Whitney DOB: 1945-07-31  REASON FOR VISIT: follow up HISTORY FROM: patient  HISTORY OF PRESENT ILLNESS: Today 06/03/20: Jessica Whitney is a 74 year old female with a history of dementia.  She returns today for follow-up.  She is here today with her husband and son.  They both feel that her memory has gotten worse.  The patient feels that her memory is stable.  They report that she continues to be repetitive.  They have a hard time getting her to go to places.  Reports that they had a hard time getting her car to come to this appointment today.  At home patient is able to complete all ADLs independently.  She does not operate a motor vehicle.  She does not do any cooking.  She remains on Namenda 10 mg twice a day.  No hallucinations or delusions.  No significant change in her mood or behavior.   03/16/20:Jessica Whitney is a 74 year old female with a history of dementia.  She returns today for follow-up.  Her husband is with her today.  He feels that her memory has gotten worse.  He states that there is been a gradual decline over the last several months.  She is still able to complete all ADLs independently.  She does not operate a motor vehicle.  He states that she tends to fall asleep on the couch and sleeps in late the next day.  Patient reports good appetite.  Denies any change in mood or behavior.  No hallucinations or delusions.  Has reports that she can be repetitive at times.  She is currently taking Namenda 5 mg twice a day   HISTORY 12/02/19:  Jessica Whitney is a 74 year old female with a history of dementia.  She returns today for follow-up.  She is here today with her husband and her son.  They feel that over the last year her memory has gotten worse.  She lives at home with her husband.  She is able to complete all ADLs independently.  She no longer operates a motor vehicle.  She no longer cooks but the husband does not feel that this its due to her memory.  He reports that at  bedtime sometimes she forgets that she is married and would ask him to sleep in another room.  Her son also notes that sometimes she may not remember things about her grandchildren.  She was on Aricept but was taken off of this medication due to weight loss.  Although the family reports that she had acute pancreatitis and that is what contributed to the weight loss she returns today for an evaluation.  REVIEW OF SYSTEMS: Out of a complete 14 system review of symptoms, the patient complains only of the following symptoms, and all other reviewed systems are negative.  See HPI  ALLERGIES: Allergies  Allergen Reactions  . Sulfa Antibiotics Other (See Comments)    Caused mouth sores    HOME MEDICATIONS: Outpatient Medications Prior to Visit  Medication Sig Dispense Refill  . DULoxetine HCl 40 MG CPEP Take 1 tablet by mouth in the morning and at bedtime. 2 tablets per day    . levothyroxine (SYNTHROID) 100 MCG tablet Take 100 mcg by mouth daily before breakfast.    . lidocaine (LIDODERM) 5 % Place 1 patch onto the skin daily. Remove & Discard patch within 12 hours or as directed by MD. Lower back 30 patch 0  . memantine (NAMENDA) 10 MG tablet Take 1 tablet (10 mg  total) by mouth 2 (two) times daily. 180 tablet 3  . ondansetron (ZOFRAN ODT) 4 MG disintegrating tablet Take 1 tablet (4 mg total) by mouth every 8 (eight) hours as needed for nausea or vomiting. 30 tablet 0  . pantoprazole (PROTONIX) 40 MG tablet Take 40 mg by mouth every morning.    . valACYclovir (VALTREX) 500 MG tablet Take by mouth 2 (two) times daily.    . vitamin B-12 (CYANOCOBALAMIN) 1000 MCG tablet Take 1,000 mcg by mouth daily.     No facility-administered medications prior to visit.    PAST MEDICAL HISTORY: Past Medical History:  Diagnosis Date  . Acute pancreatitis 08/30/2019  . Arthritis   . Diverticulitis   . Fibromyalgia   . GERD (gastroesophageal reflux disease)    bucchini  . Hypokalemia 08/30/2019  .  Hypothyroidism 08/30/2019  . Major depressive disorder 08/30/2019  . Major neurocognitive disorder 09/22/2019  . PONV (postoperative nausea and vomiting)   . Spinal stenosis   . Vitamin B12 deficiency   . Vitamin D deficiency     PAST SURGICAL HISTORY: Past Surgical History:  Procedure Laterality Date  . APPENDECTOMY    . BACK SURGERY    . bone spur shoulder Bilateral   . BREAST EXCISIONAL BIOPSY Right   . BREAST SURGERY Right    lumpectomy non cancerous  . CHOLECYSTECTOMY    . COLON SURGERY     foot of colon removed - diverticulitis  . EYE SURGERY Left    cross-eye fixed  . JOINT REPLACEMENT Right    knee  . knee arthroscopic Right   . LUMBAR LAMINECTOMY/DECOMPRESSION MICRODISCECTOMY N/A 04/03/2013   Procedure: Lumbar Four-Five Lumbar diskectomy with Coflex;  Surgeon: Reinaldo Meeker, MD;  Location: [facility];  Service: Neurosurgery;  Laterality: N/A;  L4-5 Lumbar diskectomy with Coflex  . TRIGGER FINGER RELEASE Right    x2  . TUBAL LIGATION      FAMILY HISTORY: No family history on file.  SOCIAL HISTORY: Social History   Socioeconomic History  . Marital status: Married    Spouse name: Not on file  . Number of children: Not on file  . Years of education: 51  . Highest education level: Some college, no degree  Occupational History  . Occupation: Retired    Comment: Geophysicist/field seismologist for 20 years  Tobacco Use  . Smoking status: Former Games developer  . Smokeless tobacco: Never Used  Substance and Sexual Activity  . Alcohol use: Yes    Comment: very rare glass of wine  . Drug use: No  . Sexual activity: Not on file  Other Topics Concern  . Not on file  Social History Narrative  . Not on file   Social Determinants of Health   Financial Resource Strain: Not on file  Food Insecurity: Not on file  Transportation Needs: Not on file  Physical Activity: Not on file  Stress: Not on file  Social Connections: Not on file  Intimate Partner Violence: Not on file       PHYSICAL EXAM  There were no vitals filed for this visit. There is no height or weight on file to calculate BMI.   MMSE - Mini Mental State Exam 06/03/2020 03/16/2020 12/02/2019  Orientation to time 1 0 0  Orientation to Place 4 3 5   Registration 3 2 3   Attention/ Calculation 1 0 1  Recall 0 1 0  Language- name 2 objects 2 2 2   Language- repeat 0 1 1  Language- follow  3 step command 3 3 3   Language- read & follow direction 1 1 1   Write a sentence 1 1 1   Copy design 1 1 1   Copy design-comments - - 5 animals  Total score 17 15 18      Generalized: Well developed, in no acute distress   Neurological examination  Mentation: Alert oriented to time, place, history taking. Follows all commands speech and language fluent Cranial nerve II-XII: Pupils were equal round reactive to light. Extraocular movements were full, visual field were full on confrontational test. Facial sensation and strength were normal. Uvula tongue midline. Head turning and shoulder shrug  were normal and symmetric. Motor: The motor testing reveals 5 over 5 strength of all 4 extremities. Good symmetric motor tone is noted throughout.  Sensory: Sensory testing is intact to soft touch on all 4 extremities. No evidence of extinction is noted.  Coordination: Cerebellar testing reveals good finger-nose-finger and heel-to-shin bilaterally.  Gait and station: Gait is normal.  Reflexes: Deep tendon reflexes are symmetric and normal bilaterally.   DIAGNOSTIC DATA (LABS, IMAGING, TESTING) - I reviewed patient records, labs, notes, testing and imaging myself where available.  Lab Results  Component Value Date   WBC 17.8 (H) 09/06/2019   HGB 11.2 (L) 09/06/2019   HCT 34.1 (L) 09/06/2019   MCV 88.6 09/06/2019   PLT 242 09/06/2019      Component Value Date/Time   NA 134 (L) 09/06/2019 0444   K 4.2 09/06/2019 0444   CL 103 09/06/2019 0444   CO2 23 09/06/2019 0444   GLUCOSE 109 (H) 09/06/2019 0444   BUN 6 (L)  09/06/2019 0444   CREATININE 0.71 09/06/2019 0444   CALCIUM 7.4 (L) 09/06/2019 0444   PROT 4.9 (L) 09/05/2019 0517   ALBUMIN 2.3 (L) 09/05/2019 0517   AST 48 (H) 09/05/2019 0517   ALT 36 09/05/2019 0517   ALKPHOS 179 (H) 09/05/2019 0517   BILITOT 0.9 09/05/2019 0517   GFRNONAA >60 09/06/2019 0444   GFRAA >60 09/06/2019 0444    Lab Results  Component Value Date   TSH 2.229 09/01/2019      ASSESSMENT AND PLAN 74 y.o. year old female  has a past medical history of Acute pancreatitis (08/30/2019), Arthritis, Diverticulitis, Fibromyalgia, GERD (gastroesophageal reflux disease), Hypokalemia (08/30/2019), Hypothyroidism (08/30/2019), Major depressive disorder (08/30/2019), Major neurocognitive disorder (09/22/2019), PONV (postoperative nausea and vomiting), Spinal stenosis, Vitamin B12 deficiency, and Vitamin D deficiency. here with :  1.  Dementia   Memory score MMSE 17/30 previously 15/30--stable  Continue Namenda 10 mg twice a day  Discussed techniques to help with the patient's care.  Also provided them with several resources.  FU 6 months or sooner if needed   I spent 25 minutes of face-to-face and non-face-to-face time with patient.  This included previsit chart review, lab review, study review, order entry, electronic health record documentation, patient education.  10/30/2019, MSN, NP-C 06/03/2020, 1:40 PM [facility] [address] [place], 07-12-1968 Butch Penny [phone]  I reviewed the above note and documentation by the Nurse Practitioner and agree with the history, exam, assessment and plan as outlined above. I was available for consultation. [address], MD, PhD [facility])

## 2020-06-03 NOTE — Telephone Encounter (Signed)
Pt's husband Jessica Whitney called to inform us that the pt is refusing to come to her appt. Husband states that she is getting worse. Pt can not remember many things like her husband and that she has been married to him for 51 yrs. She does not remember he lives there with her etc. Husband would like to speak to the provider to be advised on what else can be done for her. Please advise.

## 2020-06-03 NOTE — Telephone Encounter (Signed)
I called husband back.  They did make to appt.

## 2020-06-03 NOTE — Patient Instructions (Signed)
Your Plan:  Continue Namenda Memory score is stable If your symptoms worsen or you develop new symptoms please let us know.   Thank you for coming to see us at [facility]. I hope we have been able to provide you high quality care today.  You may receive a patient satisfaction survey over the next few weeks. We would appreciate your feedback and comments so that we may continue to improve ourselves and the health of our patients.  

## 2020-06-08 DIAGNOSIS — M810 Age-related osteoporosis without current pathological fracture: Secondary | ICD-10-CM | POA: Diagnosis not present

## 2020-06-08 DIAGNOSIS — E039 Hypothyroidism, unspecified: Secondary | ICD-10-CM | POA: Diagnosis not present

## 2020-06-08 DIAGNOSIS — E7800 Pure hypercholesterolemia, unspecified: Secondary | ICD-10-CM | POA: Diagnosis not present

## 2020-06-08 DIAGNOSIS — N183 Chronic kidney disease, stage 3 unspecified: Secondary | ICD-10-CM | POA: Diagnosis not present

## 2020-06-08 DIAGNOSIS — K219 Gastro-esophageal reflux disease without esophagitis: Secondary | ICD-10-CM | POA: Diagnosis not present

## 2020-06-08 DIAGNOSIS — M8580 Other specified disorders of bone density and structure, unspecified site: Secondary | ICD-10-CM | POA: Diagnosis not present

## 2020-06-08 DIAGNOSIS — F322 Major depressive disorder, single episode, severe without psychotic features: Secondary | ICD-10-CM | POA: Diagnosis not present

## 2020-06-21 DIAGNOSIS — H1789 Other corneal scars and opacities: Secondary | ICD-10-CM | POA: Diagnosis not present

## 2020-06-21 DIAGNOSIS — H43813 Vitreous degeneration, bilateral: Secondary | ICD-10-CM | POA: Diagnosis not present

## 2020-06-21 DIAGNOSIS — Z961 Presence of intraocular lens: Secondary | ICD-10-CM | POA: Diagnosis not present

## 2020-07-01 DIAGNOSIS — L309 Dermatitis, unspecified: Secondary | ICD-10-CM | POA: Diagnosis not present

## 2020-07-13 DIAGNOSIS — K219 Gastro-esophageal reflux disease without esophagitis: Secondary | ICD-10-CM | POA: Diagnosis not present

## 2020-07-13 DIAGNOSIS — M797 Fibromyalgia: Secondary | ICD-10-CM | POA: Diagnosis not present

## 2020-07-13 DIAGNOSIS — E7800 Pure hypercholesterolemia, unspecified: Secondary | ICD-10-CM | POA: Diagnosis not present

## 2020-07-13 DIAGNOSIS — E559 Vitamin D deficiency, unspecified: Secondary | ICD-10-CM | POA: Diagnosis not present

## 2020-07-13 DIAGNOSIS — E039 Hypothyroidism, unspecified: Secondary | ICD-10-CM | POA: Diagnosis not present

## 2020-07-13 DIAGNOSIS — R21 Rash and other nonspecific skin eruption: Secondary | ICD-10-CM | POA: Diagnosis not present

## 2020-07-13 DIAGNOSIS — R413 Other amnesia: Secondary | ICD-10-CM | POA: Diagnosis not present

## 2020-07-26 DIAGNOSIS — L218 Other seborrheic dermatitis: Secondary | ICD-10-CM | POA: Diagnosis not present

## 2020-07-26 DIAGNOSIS — L638 Other alopecia areata: Secondary | ICD-10-CM | POA: Diagnosis not present

## 2020-08-16 DIAGNOSIS — F322 Major depressive disorder, single episode, severe without psychotic features: Secondary | ICD-10-CM | POA: Diagnosis not present

## 2020-08-16 DIAGNOSIS — K219 Gastro-esophageal reflux disease without esophagitis: Secondary | ICD-10-CM | POA: Diagnosis not present

## 2020-08-16 DIAGNOSIS — M8580 Other specified disorders of bone density and structure, unspecified site: Secondary | ICD-10-CM | POA: Diagnosis not present

## 2020-08-16 DIAGNOSIS — E039 Hypothyroidism, unspecified: Secondary | ICD-10-CM | POA: Diagnosis not present

## 2020-08-16 DIAGNOSIS — M810 Age-related osteoporosis without current pathological fracture: Secondary | ICD-10-CM | POA: Diagnosis not present

## 2020-08-16 DIAGNOSIS — E7800 Pure hypercholesterolemia, unspecified: Secondary | ICD-10-CM | POA: Diagnosis not present

## 2020-08-16 DIAGNOSIS — N183 Chronic kidney disease, stage 3 unspecified: Secondary | ICD-10-CM | POA: Diagnosis not present

## 2020-09-13 DIAGNOSIS — N183 Chronic kidney disease, stage 3 unspecified: Secondary | ICD-10-CM | POA: Diagnosis not present

## 2020-09-15 DIAGNOSIS — N183 Chronic kidney disease, stage 3 unspecified: Secondary | ICD-10-CM | POA: Diagnosis not present

## 2020-09-15 DIAGNOSIS — M810 Age-related osteoporosis without current pathological fracture: Secondary | ICD-10-CM | POA: Diagnosis not present

## 2020-09-15 DIAGNOSIS — E7800 Pure hypercholesterolemia, unspecified: Secondary | ICD-10-CM | POA: Diagnosis not present

## 2020-09-15 DIAGNOSIS — M8580 Other specified disorders of bone density and structure, unspecified site: Secondary | ICD-10-CM | POA: Diagnosis not present

## 2020-09-15 DIAGNOSIS — E039 Hypothyroidism, unspecified: Secondary | ICD-10-CM | POA: Diagnosis not present

## 2020-09-15 DIAGNOSIS — K219 Gastro-esophageal reflux disease without esophagitis: Secondary | ICD-10-CM | POA: Diagnosis not present

## 2020-09-15 DIAGNOSIS — F322 Major depressive disorder, single episode, severe without psychotic features: Secondary | ICD-10-CM | POA: Diagnosis not present

## 2020-09-16 ENCOUNTER — Encounter: Payer: Self-pay | Admitting: Adult Health

## 2020-09-16 ENCOUNTER — Ambulatory Visit: Payer: Medicare HMO | Admitting: Adult Health

## 2020-09-16 VITALS — BP 118/74 | HR 86 | Ht 63.0 in | Wt 123.4 lb

## 2020-09-16 DIAGNOSIS — F0390 Unspecified dementia without behavioral disturbance: Secondary | ICD-10-CM | POA: Diagnosis not present

## 2020-09-16 MED ORDER — MEMANTINE HCL ER 28 MG PO CP24: 28.0000 mg | ORAL_CAPSULE | Freq: Every day | ORAL | 3 refills | Status: DC

## 2020-09-16 NOTE — Patient Instructions (Signed)
Your Plan:  start Namenda XR 28 mg daily If your symptoms worsen or you develop new symptoms please let us know.    Thank you for coming to see [place] at [facility]. I hope we have been able to provide you high quality care today.  You may receive a patient satisfaction survey over the next few weeks. We would appreciate your feedback and comments so that we may continue to improve ourselves and the health of our patients.

## 2020-09-16 NOTE — Progress Notes (Signed)
PATIENT: Jessica Whitney DOB: 17-Jun-1946  REASON FOR VISIT: follow up HISTORY FROM: patient  HISTORY OF PRESENT ILLNESS: Today 09/16/20: Jessica Whitney is a 75 year old female with a history of dementia.  She returns today for follow-up.  She is here today with her husband.  Husband reports that she continues to have trouble with her short-term memory.  Reports that they went to a birthday party and she did not remember it 2 hours later.  She also sometimes forgets that she is married to him.  She is able to complete all ADLs independently.  Denies any trouble sleeping.  Mood and behavior has remained relatively stable.  She is currently on Namenda 10 mg twice a day.  Husband reports that he is having a hard time getting her to take her nighttime medication.  06/03/20:Jessica Whitney is  a 75 year old female with a history of dementia.  She returns today for follow-up.  She is here today with her husband and son.  They both feel that her memory has gotten worse.  The patient feels that her memory is stable.  They report that she continues to be repetitive.  They have a hard time getting her to go to places.  Reports that they had a hard time getting her car to come to this appointment today.  At home patient is able to complete all ADLs independently.  She does not operate a motor vehicle.  She does not do any cooking.  She remains on Namenda 10 mg twice a day.  No hallucinations or delusions.  No significant change in her mood or behavior.   03/16/20:Jessica Whitney is a 75 year old female with a history of dementia.  She returns today for follow-up.  Her husband is with her today.  He feels that her memory has gotten worse.  He states that there is been a gradual decline over the last several months.  She is still able to complete all ADLs independently.  She does not operate a motor vehicle.  He states that she tends to fall asleep on the couch and sleeps in late the next day.  Patient reports good appetite.   Denies any change in mood or behavior.  No hallucinations or delusions.  Has reports that she can be repetitive at times.  She is currently taking Namenda 5 mg twice a day   HISTORY 12/02/19:  Jessica Whitney is a 75 year old female with a history of dementia.  She returns today for follow-up.  She is here today with her husband and her son.  They feel that over the last year her memory has gotten worse.  She lives at home with her husband.  She is able to complete all ADLs independently.  She no longer operates a motor vehicle.  She no longer cooks but the husband does not feel that this its due to her memory.  He reports that at bedtime sometimes she forgets that she is married and would ask him to sleep in another room.  Her son also notes that sometimes she may not remember things about her grandchildren.  She was on Aricept but was taken off of this medication due to weight loss.  Although the family reports that she had acute pancreatitis and that is what contributed to the weight loss she returns today for an evaluation.  REVIEW OF SYSTEMS: Out of a complete 14 system review of symptoms, the patient complains only of the following symptoms, and all other reviewed systems are negative.  See HPI  ALLERGIES: Allergies  Allergen Reactions  . Sulfa Antibiotics Other (See Comments)    Caused mouth sores    HOME MEDICATIONS: Outpatient Medications Prior to Visit  Medication Sig Dispense Refill  . DULoxetine HCl 40 MG CPEP Take 1 tablet by mouth in the morning and at bedtime. 2 tablets per day    . levothyroxine (SYNTHROID) 100 MCG tablet Take 100 mcg by mouth daily before breakfast.    . lidocaine (LIDODERM) 5 % Place 1 patch onto the skin daily. Remove & Discard patch within 12 hours or as directed by MD. Lower back 30 patch 0  . memantine (NAMENDA) 10 MG tablet Take 1 tablet (10 mg total) by mouth 2 (two) times daily. 180 tablet 3  . ondansetron (ZOFRAN ODT) 4 MG disintegrating tablet Take 1  tablet (4 mg total) by mouth every 8 (eight) hours as needed for nausea or vomiting. 30 tablet 0  . pantoprazole (PROTONIX) 40 MG tablet Take 40 mg by mouth every morning.    . valACYclovir (VALTREX) 500 MG tablet Take by mouth 2 (two) times daily.    . vitamin B-12 (CYANOCOBALAMIN) 1000 MCG tablet Take 1,000 mcg by mouth daily.     No facility-administered medications prior to visit.    PAST MEDICAL HISTORY: Past Medical History:  Diagnosis Date  . Acute pancreatitis 08/30/2019  . Arthritis   . Diverticulitis   . Fibromyalgia   . GERD (gastroesophageal reflux disease)    bucchini  . Hypokalemia 08/30/2019  . Hypothyroidism 08/30/2019  . Major depressive disorder 08/30/2019  . Major neurocognitive disorder 09/22/2019  . PONV (postoperative nausea and vomiting)   . Spinal stenosis   . Vitamin B12 deficiency   . Vitamin D deficiency     PAST SURGICAL HISTORY: Past Surgical History:  Procedure Laterality Date  . APPENDECTOMY    . BACK SURGERY    . bone spur shoulder Bilateral   . BREAST EXCISIONAL BIOPSY Right   . BREAST SURGERY Right    lumpectomy non cancerous  . CHOLECYSTECTOMY    . COLON SURGERY     foot of colon removed - diverticulitis  . EYE SURGERY Left    cross-eye fixed  . JOINT REPLACEMENT Right    knee  . knee arthroscopic Right   . LUMBAR LAMINECTOMY/DECOMPRESSION MICRODISCECTOMY N/A 04/03/2013   Procedure: Lumbar Four-Five Lumbar diskectomy with Coflex;  Surgeon: Reinaldo Meeker, MD;  Location: [facility];  Service: Neurosurgery;  Laterality: N/A;  L4-5 Lumbar diskectomy with Coflex  . TRIGGER FINGER RELEASE Right    x2  . TUBAL LIGATION      FAMILY HISTORY: No family history on file.  SOCIAL HISTORY: Social History   Socioeconomic History  . Marital status: Married    Spouse name: Not on file  . Number of children: Not on file  . Years of education: 52  . Highest education level: Some college, no degree  Occupational History  . Occupation: Retired     Comment: Geophysicist/field seismologist for 20 years  Tobacco Use  . Smoking status: Former Games developer  . Smokeless tobacco: Never Used  Substance and Sexual Activity  . Alcohol use: Yes    Comment: very rare glass of wine  . Drug use: No  . Sexual activity: Not on file  Other Topics Concern  . Not on file  Social History Narrative  . Not on file   Social Determinants of Health   Financial Resource Strain: Not on file  Food  Insecurity: Not on file  Transportation Needs: Not on file  Physical Activity: Not on file  Stress: Not on file  Social Connections: Not on file  Intimate Partner Violence: Not on file      PHYSICAL EXAM  Vitals:   09/16/20 1335  BP: 118/74  Pulse: 86  SpO2: 98%  Weight: 123 lb 6 oz (56 kg)  Height: 5\' 3"  (1.6 m)   Body mass index is 21.85 kg/m.   MMSE - Mini Mental State Exam 09/16/2020 06/03/2020 03/16/2020  Orientation to time 0 1 0  Orientation to Place 3 4 3   Registration 3 3 2   Attention/ Calculation 2 1 0  Recall 0 0 1  Language- name 2 objects 2 2 2   Language- repeat 1 0 1  Language- follow 3 step command 3 3 3   Language- read & follow direction 1 1 1   Write a sentence 1 1 1   Copy design 1 1 1   Copy design-comments - - -  Total score 17 17 15      Generalized: Well developed, in no acute distress   Neurological examination  Mentation: Alert oriented to time, place, history taking. Follows all commands speech and language fluent Cranial nerve II-XII: Pupils were equal round reactive to light. Extraocular movements were full, visual field were full on confrontational test. Facial sensation and strength were normal. Uvula tongue midline. Head turning and shoulder shrug  were normal and symmetric. Motor: The motor testing reveals 5 over 5 strength of all 4 extremities. Good symmetric motor tone is noted throughout.  Sensory: Sensory testing is intact to soft touch on all 4 extremities. No evidence of extinction is noted.  Coordination:  Cerebellar testing reveals good finger-nose-finger and heel-to-shin bilaterally.  Gait and station: Gait is normal.  Reflexes: Deep tendon reflexes are symmetric and normal bilaterally.   DIAGNOSTIC DATA (LABS, IMAGING, TESTING) - I reviewed patient records, labs, notes, testing and imaging myself where available.  Lab Results  Component Value Date   WBC 17.8 (H) 09/06/2019   HGB 11.2 (L) 09/06/2019   HCT 34.1 (L) 09/06/2019   MCV 88.6 09/06/2019   PLT 242 09/06/2019      Component Value Date/Time   NA 134 (L) 09/06/2019 0444   K 4.2 09/06/2019 0444   CL 103 09/06/2019 0444   CO2 23 09/06/2019 0444   GLUCOSE 109 (H) 09/06/2019 0444   BUN 6 (L) 09/06/2019 0444   CREATININE 0.71 09/06/2019 0444   CALCIUM 7.4 (L) 09/06/2019 0444   PROT 4.9 (L) 09/05/2019 0517   ALBUMIN 2.3 (L) 09/05/2019 0517   AST 48 (H) 09/05/2019 0517   ALT 36 09/05/2019 0517   ALKPHOS 179 (H) 09/05/2019 0517   BILITOT 0.9 09/05/2019 0517   GFRNONAA >60 09/06/2019 0444   GFRAA >60 09/06/2019 0444    Lab Results  Component Value Date   TSH 2.229 09/01/2019      ASSESSMENT AND PLAN [age] y.o. year old female  has a past medical history of Acute pancreatitis (08/30/2019), Arthritis, Diverticulitis, Fibromyalgia, GERD (gastroesophageal reflux disease), Hypokalemia (08/30/2019), Hypothyroidism (08/30/2019), Major depressive disorder (08/30/2019), Major neurocognitive disorder (09/22/2019), PONV (postoperative nausea and vomiting), Spinal stenosis, Vitamin B12 deficiency, and Vitamin D deficiency. here with :  1.  Dementia   Memory score MMSE 17/30 previously 17/30--stable  We will switch to Namenda XR 28 mg daily  FU 6 months or sooner if needed   I spent 30 minutes of face-to-face and non-face-to-face time with patient.  This included  previsit chart review, lab review, study review, order entry, electronic health record documentation, patient education.  Butch Penny, MSN, NP-C 09/16/2020, 1:21 PM [facility] [address] [place], [place] [address] [phone]  I reviewed the above note and documentation by the Nurse Practitioner and agree with the history, exam, assessment and plan as outlined above. I was available for consultation. Huston Foley, MD, PhD [facility])

## 2020-09-22 ENCOUNTER — Telehealth: Payer: Self-pay | Admitting: Adult Health

## 2020-09-22 MED ORDER — MEMANTINE HCL 10 MG PO TABS: 10.0000 mg | ORAL_TABLET | Freq: Two times a day (BID) | ORAL | 3 refills | Status: DC

## 2020-09-22 NOTE — Addendum Note (Signed)
Addended by: Guy Begin on: 09/22/2020 04:27 PM   Modules accepted: Orders

## 2020-09-22 NOTE — Telephone Encounter (Signed)
Can you check on this?

## 2020-09-22 NOTE — Telephone Encounter (Signed)
Called husband of pt.  Cannot afford the one capsule daily namenda xr.  Would like to go back to memantine 10mg  po bid.  90 days supply.  [facility] mail order.  He has enough right now.  Takes 10 days for mail order.  He appreciated this.  He states that pt acts out, saying she can do taxes (when they have already been done), telling him that need to go to [facility] when just went there day before.  Sometimes she says there not married.  His son, can say things that he cannot say when he is in room with pt.  Sometimes she thinks she can drive.  One time stated that he will need to take care of her she has dementia.  (lucid moment).  He is aware of disease progression.

## 2020-09-22 NOTE — Telephone Encounter (Signed)
Jessica Whitney came in and to discuss with you san issue with the Memantine prescribed at 1 capsule by mouth daily. They are refusing to take the one due to the price of the medication is appears that they are charging $200+ for each bottle. He is asking if you can re write the prescription and go back to 2 pills a day. He would need the change to be done through [facility]. He would like to speak with you so if you have a moment today he would appreciate a call on his cell. Thank you

## 2020-10-25 ENCOUNTER — Encounter: Payer: Self-pay | Admitting: Psychology

## 2020-10-26 ENCOUNTER — Encounter: Payer: Medicare HMO | Admitting: Psychology

## 2020-11-02 ENCOUNTER — Encounter: Payer: Medicare HMO | Admitting: Psychology

## 2020-11-05 DIAGNOSIS — Z1211 Encounter for screening for malignant neoplasm of colon: Secondary | ICD-10-CM | POA: Diagnosis not present

## 2020-11-05 DIAGNOSIS — Z8719 Personal history of other diseases of the digestive system: Secondary | ICD-10-CM | POA: Diagnosis not present

## 2020-11-05 DIAGNOSIS — K219 Gastro-esophageal reflux disease without esophagitis: Secondary | ICD-10-CM | POA: Diagnosis not present

## 2020-11-16 DIAGNOSIS — M8580 Other specified disorders of bone density and structure, unspecified site: Secondary | ICD-10-CM | POA: Diagnosis not present

## 2020-11-16 DIAGNOSIS — F322 Major depressive disorder, single episode, severe without psychotic features: Secondary | ICD-10-CM | POA: Diagnosis not present

## 2020-11-16 DIAGNOSIS — N183 Chronic kidney disease, stage 3 unspecified: Secondary | ICD-10-CM | POA: Diagnosis not present

## 2020-11-16 DIAGNOSIS — E039 Hypothyroidism, unspecified: Secondary | ICD-10-CM | POA: Diagnosis not present

## 2020-11-16 DIAGNOSIS — M810 Age-related osteoporosis without current pathological fracture: Secondary | ICD-10-CM | POA: Diagnosis not present

## 2020-11-16 DIAGNOSIS — E7800 Pure hypercholesterolemia, unspecified: Secondary | ICD-10-CM | POA: Diagnosis not present

## 2020-11-16 DIAGNOSIS — K219 Gastro-esophageal reflux disease without esophagitis: Secondary | ICD-10-CM | POA: Diagnosis not present

## 2020-11-23 ENCOUNTER — Ambulatory Visit (INDEPENDENT_AMBULATORY_CARE_PROVIDER_SITE_OTHER): Payer: Medicare HMO | Admitting: Psychology

## 2020-11-23 ENCOUNTER — Ambulatory Visit: Payer: Medicare HMO | Admitting: Psychology

## 2020-11-23 ENCOUNTER — Encounter: Payer: Self-pay | Admitting: Psychology

## 2020-11-23 ENCOUNTER — Other Ambulatory Visit: Payer: Self-pay

## 2020-11-23 DIAGNOSIS — G309 Alzheimer's disease, unspecified: Secondary | ICD-10-CM | POA: Diagnosis not present

## 2020-11-23 DIAGNOSIS — R4189 Other symptoms and signs involving cognitive functions and awareness: Secondary | ICD-10-CM

## 2020-11-23 DIAGNOSIS — E538 Deficiency of other specified B group vitamins: Secondary | ICD-10-CM | POA: Insufficient documentation

## 2020-11-23 DIAGNOSIS — F0280 Dementia in other diseases classified elsewhere without behavioral disturbance: Secondary | ICD-10-CM | POA: Diagnosis not present

## 2020-11-23 DIAGNOSIS — E559 Vitamin D deficiency, unspecified: Secondary | ICD-10-CM | POA: Insufficient documentation

## 2020-11-23 NOTE — Progress Notes (Signed)
   Psychometrician Note   Cognitive testing was administered to [name] by Wallace Keller, B.S. (psychometrist) under the supervision of Dr. Newman Nickels, Ph.D., licensed psychologist on 11/23/20. Ms. Zody did not appear overtly distressed by the testing session per behavioral observation or responses across self-report questionnaires. Rest breaks were offered.    The battery of tests administered was selected by Dr. Newman Nickels, Ph.D. with consideration to Ms. Kempton's current level of functioning, the nature of her symptoms, emotional and behavioral responses during interview, level of literacy, observed level of motivation/effort, and the nature of the referral question. This battery was communicated to the psychometrist. Communication between Dr. Newman Nickels, Ph.D. and the psychometrist was ongoing throughout the evaluation and Dr. Newman Nickels, Ph.D. was immediately accessible at all times. Dr. Newman Nickels, Ph.D. provided supervision to the psychometrist on the date of this service to the extent necessary to assure the quality of all services provided.    [name] will return within approximately 1-2 weeks for an interactive feedback session with Dr. Milbert Coulter at which time her test performances, clinical impressions, and treatment recommendations will be reviewed in detail. Ms. Perren understands she can contact our office should she require our assistance before this time.  A total of 95 minutes of billable time were spent face-to-face with Ms. Riede by the psychometrist. This includes both test administration and scoring time. Billing for these services is reflected in the clinical report generated by Dr. Newman Nickels, Ph.D.  This note reflects time spent with the psychometrician and does not include test scores or any clinical interpretations made by Dr. Milbert Coulter. The full report will follow in a separate note.

## 2020-11-23 NOTE — Progress Notes (Signed)
NEUROPSYCHOLOGICAL EVALUATION . [facility] Department of Neurology  Date of Evaluation: Nov 23, 2020  Reason for Referral:   Jessica Whitney is a 75 y.o. right-handed Caucasian female referred by Huston Foley, M.D., to characterize her current cognitive functioning and assist with diagnostic clarity and treatment planning in the context of a prior diagnosis of major neurocognitive disorder due to unclear etiology (with concerns for Alzheimer's disease).   Assessment and Plan:   Clinical Impression(s): Jessica Whitney's pattern of performance is suggestive of fairly diffuse cognitive impairment with her most profound impairment being across all aspects of learning and memory. Impairments were additionally exhibited across processing speed, executive functioning, semantic fluency, and across a line orientation task. Performance variability was exhibited across confrontation naming, while basic attention, phonemic fluency, and more constructional tasks were generally appropriate. Regarding ADLs, Jessica Whitney's husband noted that he has taken over full management of medication and financial management and that Jessica Whitney no longer drives due to cognitive concerns. This, coupled with evidence for significant cognitive dysfunction described above, suggests that she continues to meet criteria for a Major Neurocognitive Disorder ("dementia") at the present time.  Relative to her previous evaluation in 04/29/21her most pronounced area of decline was in processing speed. However, there were milder declines across semantic fluency, confrontation naming, and a line orientation task. Memory performances were stable as severe impairment and amnestic performances were noted across both evaluations. Other domains were generally stable. No areas exhibited notable improvement.   Regarding etiology, there is a high likelihood that cognitive impairment is due to the presence of Alzheimer's  disease. Across memory testing, she was amnestic across all three memory tasks and exhibited very poor performance normatively across recognition trials. This suggests the presence of a notable memory storage deficit, which is the hallmark characteristic of this condition. This storage deficit also aligns with observations of her husband and son, who have commented that Jessica Whitney will forget details of conversations or events very quickly. Additional impairment and evidence for decline across domains of semantic fluency, confrontation naming, and a line orientation task are consistent with this presentation and elevate concerns surrounding disease presence. There is reason to suspect that her disease process is progressing somewhat slowly, perhaps with the aid of medication intervention. However, it is expected that cognitive and functional decline will continue to progress over time. Continued medical monitoring will be important moving forward.   Recommendations: Jessica Whitney has already been prescribed medication (i.e., memantine/Namenda) which has been shown to slow cognitive and functional decline in some individuals. She is encouraged to continue taking this medication as prescribed. It is important to highlight that no current treatment is able to stop or reverse decline in the presence of a neurodegenerative illness.   Jessica Whitney will likely benefit from the establishment and maintenance of a routine in order to maximize functional abilities over time.  It will be important for Jessica Whitney to have another person with her when in situations where she may need to process information, weigh the pros and cons of different options, and make decisions, in order to ensure that she fully understands and recalls all information to be considered.  If not already done, Jessica Whitney and her family may want to discuss her wishes regarding durable power of attorney and medical decision making, so that she can have  input into these choices. Additionally, they may wish to discuss future plans for caretaking and seek out community options for in  home/residential care should they become necessary.  Given diffuse cognitive impairment, I recommend that Jessica Whitney fully abstain from driving. Should her family wish to pursue a formalized driving evaluation, they would be encouraged to contact [employer] [facility] in [place], [place] at [phone]. Another option would be through [facility]; however, the latter would likely require a referral from a medical doctor. [facility] can be reached directly at [phone].  Review of Records:   JessicaCooleywas seen by [facility] Huston Foley, M.D.) on 07/24/2019 for follow-up of memory loss. Performance on a brief cognitive screening instrument (MMSE) was 20/30 in October 2020. At the time of her meeting with Dr. Frances Furbish, Ms. Cooleyhad recent blood work through her primary care office and was noted to have a highly elevated TSH; her Synthroid wassubsequentlyincreased. She was alsonoted to have a low vitamin B12 and was started on B12 injections. Regarding memory loss, her husband reported her becoming more forgetfulwhereshe repeats herself often andforgets details of prior events (e.g.,going to the doctor and having been checked out for a problem that she continues to feel is a problem). Memory loss was said to be ongoing for at least the past two years.  She was admitted to the hospital on 08/30/2019 withleukocytosis, tachycardia, severe epigastric abdominal pain,andnausea and vomiting with evidence of acute pancreatitis. Upon discharge, sepsis physiology hadresolved.MRCP did not show any evidence of gallstonesand it was suspected that shelikely passed a stone. AbdomenCTrevealedextensive inflammatory changes around the pancreas. Aduodenal ulcer with possible perforation was suspected; however,GI was consultedandfeltthis  wasless likely.She was ultimately discharged home on 09/06/2019.  She completed a comprehensive neuropsychological evaluation with myself on 10/13/2019. She was originally scheduled for 09/22/2019. However, at the start of testing, she reported notable discomfort and fatigue stemming from her recent pancreatitis hospitalization. Testing was postponed until 10/13/2019. At that time, results suggested primary deficits surrounding executive functioning (including working memory), receptive language, and Artist. Performance variability was exhibited across phonemic fluency, semantic fluency (with a majority of performances below expectation), and visuospatial abilities. Performance was appropriate across domains of processing speed and confrontation naming. There were concerns for Alzheimer's disease given that she exhibited prominent memory dysfunction with amnestic retrieval. However, she performed well across a list learning recognition task, as well as two confrontation naming assessments and one of two semantic fluency tasks. She was ultimately diagnosed with a major neurocognitive disorder and repeat testing was recommended in 12 months.   She was most recently seen by [facility] Butch Penny, NP) on 09/16/2020. At that time, both her husband and son felt that memory had progressively worsened. Her husband noted that she previously attended a birthday party and did not recall attending that event about two hours later. Her husband also noted that there are days where she does not appear to remember that the two of them are married. No sleep-related difficulties were reported, nor ongoing psychiatric distress. Performance on a brief cognitive screening instrument (MMSE) was 17/30.   Brain MRI on 06/01/2019 revealed mild generalized cortical atrophy most noticeable in the insular cortex, moderate chronic microvascular ischemic changes, and a single chronic microhemorrhage in  the right temporal lobe, thought tounlikely be clinically significant.  Past Medical History:  Diagnosis Date  . Acute pancreatitis 08/30/2019  . Arthritis   . Diverticulitis   . Fibromyalgia   . GERD (gastroesophageal reflux disease)    bucchini  . Hypokalemia 08/30/2019  . Hypothyroidism 08/30/2019  . Major depressive disorder in remission 08/30/2019  .  Major neurocognitive disorder due to Alzheimer's disease 09/22/2019  . PONV (postoperative nausea and vomiting)   . Spinal stenosis   . Vitamin B12 deficiency   . Vitamin D deficiency     Past Surgical History:  Procedure Laterality Date  . APPENDECTOMY    . BACK SURGERY    . bone spur shoulder Bilateral   . BREAST EXCISIONAL BIOPSY Right   . BREAST SURGERY Right    lumpectomy non cancerous  . CHOLECYSTECTOMY    . COLON SURGERY     foot of colon removed - diverticulitis  . EYE SURGERY Left    cross-eye fixed  . JOINT REPLACEMENT Right    knee  . knee arthroscopic Right   . LUMBAR LAMINECTOMY/DECOMPRESSION MICRODISCECTOMY N/A 04/03/2013   Procedure: Lumbar Four-Five Lumbar diskectomy with Coflex;  Surgeon: Reinaldo Meekerandy O Kritzer, MD;  Location: [facility];  Service: Neurosurgery;  Laterality: N/A;  L4-5 Lumbar diskectomy with Coflex  . TRIGGER FINGER RELEASE Right    x2  . TUBAL LIGATION      Current Outpatient Medications:  .  DULoxetine HCl 40 MG CPEP, Take 1 tablet by mouth in the morning and at bedtime. 2 tablets per day, Disp: , Rfl:  .  famotidine (PEPCID) 40 MG tablet, Take 40 mg by mouth daily., Disp: , Rfl:  .  levothyroxine (SYNTHROID) 100 MCG tablet, Take 100 mcg by mouth daily before breakfast., Disp: , Rfl:  .  lidocaine (LIDODERM) 5 %, Place 1 patch onto the skin daily. Remove & Discard patch within 12 hours or as directed by MD. Lower back, Disp: 30 patch, Rfl: 0 .  memantine (NAMENDA) 10 MG tablet, Take 1 tablet (10 mg total) by mouth 2 (two) times daily., Disp: 180 tablet, Rfl: 3 .  valACYclovir (VALTREX) 500 MG  tablet, Take by mouth 2 (two) times daily., Disp: , Rfl:  .  vitamin B-12 (CYANOCOBALAMIN) 1000 MCG tablet, Take 1,000 mcg by mouth daily., Disp: , Rfl:   Clinical Interview:   The following information was obtained during a clinical interview with Ms. Hardt and her husband during her previous evaluation on 09/22/2019. Sections have been updated to reflect more current information where appropriate.  Cognitive Symptoms: Decreased short-term memory:Endorsed. Previously, her husband reported that memory difficulties had been present for the past several years and have gradually worsened over time. Examples included repeating herself often, trouble remembering details of previous conversations, trouble remembering the names of familiar individuals, and trouble remembering upcoming appointments.During the current appointment, her husband stated that progressive decline has continued. In addition to examples above, he also stated that she seems to forget things after minutes have passed by. There have also been instances where she seems to forget that they are married (e.g., "Are you going to park your car in my garage?"). The latter example seems to fluctuate day-to-day.  Decreased long-term memory:Denied. Decreased attention/concentration:Endorsed.She previously reported trouble staying focused and increased ease of distractibility. She also acknowledged that she has trouble paying attention when people are speaking to her at times.This was said to be unchanged.  Reduced processing speed:Endorsed. Difficulties with executive functions:Endorsed.Her husband previously noted prominent changes in her level of organization, detailing a history of advanced organizational abilities which have deteriorated considerably. Trouble with complex planning was also reported. She denied instances of acting impulsivity and both her and her husband denied overt personality changes.This was said to be unchanged.   Difficulties with emotion regulation:Denied. Difficulties with receptive language:Endorsed.However, deficits were largely attributed to attention/concentration  deficits. Difficulties with word finding:Denied. Decreased visuoperceptual ability:Denied.  Difficulties completing ADLs:Endorsed.Her husband reported that he has fully taken over both medication and financial management. He described an instance where he attempted to give medication responsibilities back to Ms. Joss for a week; however, this went poorly and he re-took management. She no longer drives due to cognitive concerns. Previously, her husband also described instances where Ms. Leiva would have trouble remembering to eat throughout the day.   Additional Medical History: History of traumatic brain injury/concussion:Denied. History of stroke:Denied. History of seizure activity:Denied. History of known exposure to toxins:Denied. Symptoms of chronic pain:Endorsed.Medical records suggest a history of fibromyalgia. Ms. Toothaker largely denied trouble with chronic or acute pain during the current interview. She did acknowledge a remote history of knee surgeries.This was said to be unchanged.  Experience of frequent headaches/migraines:Denied.  Frequent instances of dizziness/vertigo:Denied. She previously reported acute symptoms stemming from her March 2021 hospitalization surrounding acute pancreatitis and poor appetite/fluid intake. However, since she has recovered, no difficulties were reported.   Sensory changes:She previously reported painful eye sensations and blurred vision. However, this was not said to be a significant issue during the current interview. Other sensory difficulties/changes (e.g., hearing, taste, or smell) were denied. Balance/coordination difficulties:Denied. She previously reported acute balance instability stemming from her March 2021  Hospitalization. However, difficulties were denied  since she has recovered and no recent falls were reported.  Other motor difficulties:Denied.  Sleep History: Estimated hours obtained each night:8-10 hours. Difficulties falling asleep:Denied.  Difficulties staying asleep:Denied. Feels rested and refreshed upon awakening:Endorsed.  History of snoring:Endorsed.Symptoms were said to be mild. History of waking up gasping for YTK:[id number]. Witnessed breath cessation while asleep:Denied.  History of vivid dreaming:Denied. Excessive movement while asleep:Denied. Instances of acting outherdreams: Denied.  Psychiatric/Behavioral Health History: Depression:Endorsed.Per medical records, Ms. Kozuch has a previous history of depression. She is currently taking duloxetine andshe and her husbanddenied side effects from this medication. Currently, her and her husband described her current mood as "pretty good." Her husband did note some increased irritability lately. Current or remote suicidal ideation, intent, or plan was denied.  Anxiety:Denied.  Mania:Denied. Trauma History:Denied. Visual/auditory hallucinations:Denied. Delusional thoughts:Denied.  Tobacco:Denied. Alcohol:She reported very rare alcohol consumption and denied a history of problematic alcohol abuse or dependence. Recreational drugs:Denied. Caffeine:She reported consuming 1-2 cups of coffee in the morning, as well as occasional sweet tea throughout the day.  Family History: Family history unknown: Yes   This information was confirmed by Ms. Boody.  Academic/Vocational History: Highest level of educational attainment:13years. She graduated from high school and completed additional courses at [employer]. 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 Math was noted as a relative weakness.  History of developmental delay:Denied. History of grade repetition:Denied. Enrollment in  special education courses:Denied. History of LD/ADHD:Denied.  Employment:Retired. She previously worked as a Research officer, trade union for 20 years.  Evaluation Results:   Behavioral Observations: Ms. Orsborn was accompanied by her husband, arrived to her appointment on time, and was appropriately dressed and groomed. She appeared alert and oriented and demonstrated an appropriate sen of humor. Observed gait and station were within normal limits and improved relative to her previous evaluation where her gait was slowed and somewhat unsteady. Gross motor functioning appeared intact upon informal observation and no abnormal movements (e.g., tremors) were noted. Her affect was relaxed and positive. Spontaneous speech was fluent and word finding difficulties were not observed during the clinical interview. Thought processes  were coherent and normal in content. Insight into her cognitive difficulties continued to appear limited and she generally deferred to her husband when asked questions. However, at one point, she did state "I think I may have some dementia."   During testing, Ms. Brozowski was noted to frequently make repetitive comments. Instructions needed to be repeated several times to ensure comprehension; however, these instructions were often forgotten mid-way through performing said tasks. Sustained attention was generally appropriate. However, this did wane as testing progressed. Task engagement was initially adequate. However, she became fatigued and testing tolerance waned as the evaluation progressed to the extent to where she made comments surrounding individuals waiting in the lobby for her in between each task administered. The evaluation was eventually abbreviated due to waning testing tolerance. Overall, Ms. Cristina was generally cooperative with the clinical interview and subsequent testing procedures.   Adequacy of Effort: The validity of neuropsychological testing is limited by the extent to  which the individual being tested may be assumed to have exerted adequate effort during testing. Ms. Junod expressed her intention to perform to the best of her abilities and exhibited adequate task engagement and persistence. Performance on an embedded performance validity measure was within expectation. As such, the results of the current evaluation are believed to be a valid representation of Ms. Grimmer's current cognitive functioning.  Test Results: Ms. Tarnow was very disoriented at the time of the current evaluation. She incorrectly stated her age ("59") and could not provide her address. She incorrectly stated the current year ("2003") and month ("April") as well as was unable to state the current date, day of the week, time or location. When asked why she was present for this evaluation, she responded with "working on my brain."  Intellectual abilities based upon educational and vocational attainment were estimated to be in the average range. Premorbid abilities were estimated to be within the lower limits of the average range based upon a single-word reading test administered during the previous evaluation.   Processing speed was exceptionally low. Basic attention was below average. More complex attention (e.g., working memory) could not be assessed. Assessed executive functioning was (i.e., cognitive flexibility) was exceptionally low.  Based upon her previous evaluation, receptive language abilities are likely below expectation. Currently, Ms. Mula did exhibit notable difficulties comprehending task instructions. Assessed expressive language was variable. Phonemic fluency was below average, semantic fluency was exceptionally low to well below average, and confrontation naming was average across a screening battery but exceptionally low across a more comprehensive task.    Assessed visuoconstructional abilities were average. Points were lost on her drawing of a clock due to incorrect hand  placement. Performance across a line orientation task was exceptionally low.   Learning (i.e., encoding) of novel verbal information was exceptionally low. Spontaneous delayed recall (i.e., retrieval) of previously learned information was also exceptionally low. Retention rates were 0% across a story learning task, 0% across a list learning task, and 0% across a figure drawing task. Performance across recognition tasks was exceptionally low to well below average, suggesting minimal evidence for information consolidation.   Results of emotional screening instruments suggested that recent symptoms of generalized anxiety were in the minimal range, while symptoms of depression were within the mild range.  Tables of Scores:   Note: This summary of test scores accompanies the interpretive report and should not be considered in isolation without reference to the appropriate sections in the text. Descriptors are based on appropriate normative data and may be adjusted  based on clinical judgment. The terms "impaired" and "within normal limits (WNL)" are used when a more specific level of functioning cannot be determined. Descriptors refer to the current evaluation only.         Validity Testing:    DESCRIPTOR   April 2021 Current    RBANS Effort Index: --- --- --- Within Expectation        Orientation:       Raw Score Raw Score Percentile   NAB Orientation, Form 1 16/29 14/29 --- ---        Cognitive Screening:             Raw Score Raw Score Percentile   SLUMS: 12/30 7/30 --- ---        RBANS, Form A: Standard Score/ Scaled Score Standard Score/ Scaled Score Percentile   Total Score 65 54 <1 Exceptionally Low  Immediate Memory 49 40 <1 Exceptionally Low    List Learning 3 1 <1 Exceptionally Low    Story Memory 1 1 <1 Exceptionally Low  Visuospatial/Constructional 87 78 7 Well Below Average    Figure Copy 10 9 37 Average    Line Orientation 12/20 8/20 <2 Exceptionally Low  Language 85 85 16  Below Average    Picture Naming 10/10 10/10 51-75 Average    Semantic Fluency Well Below Average  Attention 82 64 1 Exceptionally Low    Digit Span Below Average    Coding 6 2 <1 Exceptionally Low  Delayed Memory 60 52 <1 Exceptionally Low    List Recall 0/10 0/10 <2 Exceptionally Low    List Recognition 18/20 16/20 <2 Exceptionally Low    Story Recall 2 1 <1 Exceptionally Low    Story Recognition 0/12 4/12 <1 Exceptionally Low    Figure Recall 1 1 <1 Exceptionally Low    Figure Recognition 0/8 3/8 4-8 Well Below Average        Intellectual Functioning:             Standard Score Standard Score Percentile   Test of Premorbid Functioning: 92 --- --- ---        Attention/Executive Function:            Trail Making Test (TMT): Raw Score Raw Score (T Score) Percentile     Part A 34 secs.,  0 errors 79 secs.,  1 error (28) 2 Exceptionally Low    Part B Discontinued Discontinued --- Impaired          Scaled Score Scaled Score Percentile   WAIS-IV Digit Span: 5 --- --- ---    Forward 8 --- --- ---    Backward 8 --- --- ---    Sequencing 4 --- --- ---        D-KEFS Color-Word Interference Test: Raw Score (Scaled Score) Raw Score (Scaled Score) Percentile     Color Naming 33 secs. (10) --- --- ---    Word Reading 27 secs. (9) --- --- ---    Inhibition 105 secs. (5) --- --- ---      Total Errors 11 errors (2) --- --- ---    Inhibition/Switching Discontinued --- --- ---      Total Errors --- --- --- ---        D-KEFS Verbal Fluency Test: Raw Score Raw Score (Scaled Score) Percentile     Letter Total Correct 24 26 (7) 16 Below Average    Category Total Correct 27 15 (3) 1 Exceptionally Low  Category Switching Total Correct 3 5 (2) <1 Exceptionally Low    Category Switching Accuracy 2 4 (3) 1 Exceptionally Low      Total Set Loss Errors 2 10 (1) <1 Exceptionally Low      Total Repetition Errors 10 17 (1) <1 Exceptionally Low        NAB Executive Functions  Module, Form 1: T Score T Score Percentile     Judgment 22 --- --- ---        Language:            Verbal Fluency Test: Raw Score Raw Score (T Score) Percentile     Phonemic Fluency (FAS) 24 26 (39) 14 Below Average    Animal Fluency 8 7 (19) <1 Exceptionally Low         NAB Language Module, Form 1: T Score T Score Percentile     Auditory Comprehension 27 --- --- ---    Naming 29/31 (47) 24/31 (29) 2 Exceptionally Low        Visuospatial/Visuoconstruction:       Raw Score Raw Score Percentile   Clock Drawing: 9/10 8/10 --- Within Normal Limits        Mood and Personality:       Raw Score Raw Score Percentile   Geriatric Depression Scale: 8 12 --- Mild  Geriatric Anxiety Scale: 17 11 --- Minimal    Somatic 8 5 --- Minimal    Cognitive 5 3 --- Mild    Affective 4 3 --- Minimal   Informed Consent and Coding/Compliance:   The current evaluation represents a clinical evaluation for the purposes previously outlined by the referral source and is in no way reflective of a forensic evaluation.   Ms. Beringer was provided with a verbal description of the nature and purpose of the present neuropsychological evaluation. Also reviewed were the foreseeable risks and/or discomforts and benefits of the procedure, limits of confidentiality, and mandatory reporting requirements of this provider. The patient was given the opportunity to ask questions and receive answers about the evaluation. Oral consent to participate was provided by the patient.   This evaluation was conducted by Newman Nickels, Ph.D., licensed clinical neuropsychologist. Ms. Karle completed a clinical interview with Dr. Milbert Coulter. However, given its brevity and that much information was pulled from her prior report, no charges were generated for this portion of the evaluation. She completed 95 minutes of cognitive testing and scoring, billed as one unit [phone] and two additional units [id number]. Psychometrist Wallace Keller, B.S., assisted  Dr. Milbert Coulter with test administration and scoring procedures. As a separate and discrete service, Dr. Milbert Coulter spent a total of 155 minutes in interpretation and report writing billed as one unit [phone] and two units [id number].

## 2020-11-30 ENCOUNTER — Other Ambulatory Visit: Payer: Self-pay

## 2020-11-30 ENCOUNTER — Ambulatory Visit (INDEPENDENT_AMBULATORY_CARE_PROVIDER_SITE_OTHER): Payer: Medicare HMO | Admitting: Psychology

## 2020-11-30 DIAGNOSIS — F0280 Dementia in other diseases classified elsewhere without behavioral disturbance: Secondary | ICD-10-CM | POA: Diagnosis not present

## 2020-11-30 DIAGNOSIS — F0281 Dementia in other diseases classified elsewhere with behavioral disturbance: Secondary | ICD-10-CM

## 2020-11-30 DIAGNOSIS — G309 Alzheimer's disease, unspecified: Secondary | ICD-10-CM | POA: Diagnosis not present

## 2020-11-30 NOTE — Progress Notes (Signed)
   Neuropsychology Feedback Session Jessica Whitney. [facility] Department of Neurology  Reason for Referral:   [name] a 75 y.o. right-handed Caucasian female referred by Huston Foley, M.D.,to characterize hercurrent cognitive functioning and assist with diagnostic clarity and treatment planning in the context of a prior diagnosis of major neurocognitive disorder due to unclear etiology (with concerns for Alzheimer's disease).   Feedback:   [name] completed a comprehensive neuropsychological evaluation on 11/23/2020. Please refer to that encounter for the full report and recommendations. Briefly, results suggested fairly diffuse cognitive impairment with her most profound impairment being across all aspects of learning and memory. Impairments were additionally exhibited across processing speed, executive functioning, semantic fluency, and across a line orientation task. Performance variability was exhibited across confrontation naming. Relative to her previous evaluation in 04-30-2021her most pronounced area of decline was in processing speed. However, there were milder declines across semantic fluency, confrontation naming, and a line orientation task. Regarding etiology, there is a high likelihood that cognitive impairment is due to the presence of Alzheimer's disease.   [name] was accompanied by her husband during the current feedback session. Content of the current session focused on the results of her neuropsychological evaluation. [name] and her husband were given the opportunity to ask questions and their questions were answered. They were encouraged to reach out should additional questions arise. A copy of her report was provided at the conclusion of the visit.      25 minutes were spent conducting the current feedback session with [name], billed as one unit [phone].

## 2020-12-03 DIAGNOSIS — B0052 Herpesviral keratitis: Secondary | ICD-10-CM | POA: Diagnosis not present

## 2020-12-08 DIAGNOSIS — H1789 Other corneal scars and opacities: Secondary | ICD-10-CM | POA: Diagnosis not present

## 2020-12-08 DIAGNOSIS — B0052 Herpesviral keratitis: Secondary | ICD-10-CM | POA: Diagnosis not present

## 2020-12-15 DIAGNOSIS — H1789 Other corneal scars and opacities: Secondary | ICD-10-CM | POA: Diagnosis not present

## 2020-12-15 DIAGNOSIS — B0052 Herpesviral keratitis: Secondary | ICD-10-CM | POA: Diagnosis not present

## 2021-01-05 DIAGNOSIS — H16233 Neurotrophic keratoconjunctivitis, bilateral: Secondary | ICD-10-CM | POA: Diagnosis not present

## 2021-01-05 DIAGNOSIS — H18413 Arcus senilis, bilateral: Secondary | ICD-10-CM | POA: Diagnosis not present

## 2021-01-05 DIAGNOSIS — H43813 Vitreous degeneration, bilateral: Secondary | ICD-10-CM | POA: Diagnosis not present

## 2021-01-05 DIAGNOSIS — H18593 Other hereditary corneal dystrophies, bilateral: Secondary | ICD-10-CM | POA: Diagnosis not present

## 2021-01-06 ENCOUNTER — Encounter: Payer: Self-pay | Admitting: Adult Health

## 2021-01-07 ENCOUNTER — Emergency Department (HOSPITAL_BASED_OUTPATIENT_CLINIC_OR_DEPARTMENT_OTHER)
Admission: EM | Admit: 2021-01-07 | Discharge: 2021-01-08 | Disposition: A | Discharge status: Home / Self Care | Payer: Medicare HMO | Attending: Emergency Medicine | Admitting: Emergency Medicine

## 2021-01-07 ENCOUNTER — Emergency Department (HOSPITAL_BASED_OUTPATIENT_CLINIC_OR_DEPARTMENT_OTHER): Payer: Medicare HMO

## 2021-01-07 ENCOUNTER — Other Ambulatory Visit: Payer: Self-pay

## 2021-01-07 ENCOUNTER — Encounter (HOSPITAL_BASED_OUTPATIENT_CLINIC_OR_DEPARTMENT_OTHER): Payer: Self-pay | Admitting: *Deleted

## 2021-01-07 DIAGNOSIS — Z79899 Other long term (current) drug therapy: Secondary | ICD-10-CM | POA: Diagnosis not present

## 2021-01-07 DIAGNOSIS — G309 Alzheimer's disease, unspecified: Secondary | ICD-10-CM | POA: Insufficient documentation

## 2021-01-07 DIAGNOSIS — W1830XA Fall on same level, unspecified, initial encounter: Secondary | ICD-10-CM | POA: Insufficient documentation

## 2021-01-07 DIAGNOSIS — S0181XA Laceration without foreign body of other part of head, initial encounter: Secondary | ICD-10-CM | POA: Insufficient documentation

## 2021-01-07 DIAGNOSIS — Z96651 Presence of right artificial knee joint: Secondary | ICD-10-CM | POA: Diagnosis not present

## 2021-01-07 DIAGNOSIS — S0990XA Unspecified injury of head, initial encounter: Secondary | ICD-10-CM | POA: Diagnosis not present

## 2021-01-07 DIAGNOSIS — E039 Hypothyroidism, unspecified: Secondary | ICD-10-CM | POA: Insufficient documentation

## 2021-01-07 DIAGNOSIS — S01412A Laceration without foreign body of left cheek and temporomandibular area, initial encounter: Secondary | ICD-10-CM | POA: Insufficient documentation

## 2021-01-07 DIAGNOSIS — Z87891 Personal history of nicotine dependence: Secondary | ICD-10-CM | POA: Insufficient documentation

## 2021-01-07 DIAGNOSIS — S0993XA Unspecified injury of face, initial encounter: Secondary | ICD-10-CM | POA: Diagnosis not present

## 2021-01-07 DIAGNOSIS — M79641 Pain in right hand: Secondary | ICD-10-CM | POA: Diagnosis not present

## 2021-01-07 MED ORDER — LIDOCAINE-EPINEPHRINE-TETRACAINE (LET) TOPICAL GEL
3.0000 mL | Freq: Once | TOPICAL | Status: AC
  Administered 2021-01-07: 3 mL via TOPICAL
  Filled 2021-01-07: qty 3

## 2021-01-07 NOTE — ED Notes (Signed)
ED Provider at bedside. Pt ambulating around room.

## 2021-01-07 NOTE — ED Triage Notes (Signed)
Husband states fall x 2 hrs ago on concrete , injuring chin and left side of face, right hand

## 2021-01-07 NOTE — ED Notes (Signed)
Verbal order FOrd PA for CT's

## 2021-01-08 NOTE — ED Provider Notes (Signed)
[facility] EMERGENCY DEPARTMENT Provider Note   CSN: [id number] Arrival date & time: 01/07/21  2150     History Chief Complaint  Patient presents with   Marletta Lor    Jessica Whitney is a 75 y.o. female.   Fall This is a new problem. The current episode started 1 to 2 hours ago. The problem occurs rarely. The problem has not changed since onset.Pertinent negatives include no chest pain, no headaches and no shortness of breath. Nothing aggravates the symptoms. She has tried nothing for the symptoms.      Past Medical History:  Diagnosis Date   Acute pancreatitis 08/30/2019   Arthritis    Diverticulitis    Fibromyalgia    GERD (gastroesophageal reflux disease)    bucchini   Hypokalemia 08/30/2019   Hypothyroidism 08/30/2019   Major depressive disorder in remission 08/30/2019   Major neurocognitive disorder due to Alzheimer's disease 09/22/2019   PONV (postoperative nausea and vomiting)    Spinal stenosis    Vitamin B12 deficiency    Vitamin D deficiency     Patient Active Problem List   Diagnosis Date Noted   Vitamin B12 deficiency    Vitamin D deficiency    Major neurocognitive disorder due to Alzheimer's disease 09/22/2019   Acute pancreatitis 08/30/2019   Hypothyroidism 08/30/2019   GERD (gastroesophageal reflux disease) 08/30/2019   Fibromyalgia 08/30/2019   Major depressive disorder in remission 08/30/2019   Hypokalemia 08/30/2019    Past Surgical History:  Procedure Laterality Date   APPENDECTOMY     BACK SURGERY     bone spur shoulder Bilateral    BREAST EXCISIONAL BIOPSY Right    BREAST SURGERY Right    lumpectomy non cancerous   CHOLECYSTECTOMY     COLON SURGERY     foot of colon removed - diverticulitis   EYE SURGERY Left    cross-eye fixed   JOINT REPLACEMENT Right    knee   knee arthroscopic Right    LUMBAR LAMINECTOMY/DECOMPRESSION MICRODISCECTOMY N/A 04/03/2013   Procedure: Lumbar Four-Five Lumbar diskectomy with Coflex;  Surgeon: Reinaldo Meeker, MD;  Location: [facility];  Service: Neurosurgery;  Laterality: N/A;  L4-5 Lumbar diskectomy with Coflex   TRIGGER FINGER RELEASE Right    x2   TUBAL LIGATION       OB History   No obstetric history on file.     Family History  Family history unknown: Yes    Social History   Tobacco Use   Smoking status: Former   Smokeless tobacco: Never  Substance Use Topics   Alcohol use: Not Currently    Alcohol/week: 0.0 standard drinks    Comment: very rare glass of wine   Drug use: No    Home Medications Prior to Admission medications   Medication Sig Start Date End Date Taking? Authorizing Provider  DULoxetine HCl 40 MG CPEP Take 1 tablet by mouth in the morning and at bedtime. 2 tablets per day 05/19/19   [provider]  famotidine (PEPCID) 40 MG tablet Take 40 mg by mouth daily.    [provider]  levothyroxine (SYNTHROID) 100 MCG tablet Take 100 mcg by mouth daily before breakfast.    [provider]  lidocaine (LIDODERM) 5 % Place 1 patch onto the skin daily. Remove & Discard patch within 12 hours or as directed by MD. Lower back 09/07/19   Rai, Ripudeep K, MD  memantine (NAMENDA) 10 MG tablet Take 1 tablet (10 mg total) by  mouth 2 (two) times daily. 09/22/20   Butch Penny, NP  valACYclovir (VALTREX) 500 MG tablet Take by mouth 2 (two) times daily. 11/12/19   [provider]  vitamin B-12 (CYANOCOBALAMIN) 1000 MCG tablet Take 1,000 mcg by mouth daily.    [provider]    Allergies    Sulfa antibiotics  Review of Systems   Review of Systems  Respiratory:  Negative for shortness of breath.   Cardiovascular:  Negative for chest pain.  Neurological:  Negative for headaches.  All other systems reviewed and are negative.  Physical Exam Updated Vital Signs BP 114/67   Pulse 87   Temp 98 F (36.7 C) (Oral)   Resp 16   Ht 5\' 3"  (1.6 m)   Wt 52.6 kg   SpO2 100%   BMI 20.55 kg/m   Physical Exam Vitals and  nursing note reviewed.  Constitutional:      Appearance: She is well-developed.  HENT:     Head: Normocephalic.     Comments: Laceration under chin, approximately 1.0 cm total length, stellate shaped Also 0.5 cm laceration to left cheek that is well-approximated and hemostatic.     Mouth/Throat:     Mouth: Mucous membranes are moist.     Pharynx: Oropharynx is clear.  Eyes:     Pupils: Pupils are equal, round, and reactive to light.  Cardiovascular:     Rate and Rhythm: Normal rate and regular rhythm.  Pulmonary:     Effort: No respiratory distress.     Breath sounds: No stridor.  Abdominal:     General: There is no distension.  Musculoskeletal:     Cervical back: Normal range of motion.  Neurological:     Mental Status: She is alert.    ED Results / Procedures / Treatments   Labs (all labs ordered are listed, but only abnormal results are displayed) Labs Reviewed - No data to display  EKG None  Radiology CT Head Wo Contrast  Result Date: 01/07/2021 CLINICAL DATA:  01/09/2021 2 hours ago, left-sided facial injury EXAM: CT HEAD WITHOUT CONTRAST TECHNIQUE: Contiguous axial images were obtained from the base of the skull through the vertex without intravenous contrast. COMPARISON:  05/30/2019 FINDINGS: Brain: Hypodensities throughout the periventricular white matter are consistent with chronic small vessel ischemic changes, stable since prior study. No signs of acute infarct or hemorrhage. Lateral ventricles and midline structures are unremarkable. No acute extra-axial fluid collections. No mass effect. Vascular: No hyperdense vessel or unexpected calcification. Skull: Normal. Negative for fracture or focal lesion. Sinuses/Orbits: Chronic right mastoid effusion. Paranasal sinuses are clear. Other: None. IMPRESSION: 1. Stable chronic small vessel ischemic changes. No acute intracranial process. Electronically Signed   By: 14/09/2018 M.D.   On: 01/07/2021 22:43   DG Hand Complete  Right  Result Date: 01/07/2021 CLINICAL DATA:  Fall, right hand pain EXAM: RIGHT HAND - COMPLETE 3+ VIEW COMPARISON:  None. FINDINGS: Three view radiograph right hand demonstrates normal alignment. No acute fracture or dislocation. Moderate degenerative arthritis noted at the first carpometacarpal joint at the base of the thumb. Milder degenerative changes are noted involving the interphalangeal joint of the thumb and second and third DIP joints. Remaining joint spaces are preserved. Soft tissues are unremarkable. IMPRESSION: No acute fracture or dislocation. Electronically Signed   By: 01/09/2021 MD   On: 01/07/2021 23:03   CT Maxillofacial Wo Contrast  Result Date: 01/07/2021 CLINICAL DATA:  01/09/2021 2 hours ago, left-sided facial injury EXAM: CT  MAXILLOFACIAL WITHOUT CONTRAST TECHNIQUE: Multidetector CT imaging of the maxillofacial structures was performed. Multiplanar CT image reconstructions were also generated. COMPARISON:  04/09/2015 FINDINGS: Osseous: No fracture or mandibular dislocation. No destructive process. Orbits: Negative. No traumatic or inflammatory finding. Sinuses: Clear. Soft tissues: Small laceration overlying the left zygomatic arch anteriorly. Remaining soft tissues are unremarkable. Limited intracranial: No significant or unexpected finding. IMPRESSION: 1. Small left-sided soft tissue laceration overlying the zygomatic arch. 2. No acute displaced fracture. Electronically Signed   By: Sharlet Salina M.D.   On: 01/07/2021 22:41    Procedures .Marland KitchenLaceration Repair  Date/Time: 01/08/2021 4:37 AM Performed by: Marily Memos, MD Authorized by: Marily Memos, MD   Consent:    Consent obtained:  Verbal   Consent given by:  Patient   Risks discussed:  Infection, need for additional repair, nerve damage, poor wound healing, poor cosmetic result, pain, retained foreign body, tendon damage and vascular damage   Alternatives discussed:  No treatment, delayed treatment and  observation Anesthesia:    Anesthesia method:  Local infiltration   Local anesthetic:  Lidocaine 1% w/o epi Laceration details:    Location:  Face   Face location:  Chin   Length (cm):  1   Depth (mm):  2 Pre-procedure details:    Preparation:  Patient was prepped and draped in usual sterile fashion and imaging obtained to evaluate for foreign bodies Exploration:    Limited defect created (wound extended): no     Wound exploration: wound explored through full range of motion     Contaminated: no   Treatment:    Area cleansed with:  Saline   Amount of cleaning:  Extensive   Irrigation solution:  Sterile water   Irrigation volume:  50   Irrigation method:  Syringe   Visualized foreign bodies/material removed: no     Debridement:  None   Scar revision: no   Skin repair:    Repair method:  Sutures   Suture size:  5-0   Suture material:  Fast-absorbing gut   Suture technique:  Simple interrupted   Number of sutures:  3 Approximation:    Approximation:  Close Repair type:    Repair type:  Simple Post-procedure details:    Dressing:  Antibiotic ointment and adhesive bandage   Procedure completion:  Tolerated well, no immediate complications  Medications Ordered in ED Medications  lidocaine-EPINEPHrine-tetracaine (LET) topical gel (3 mLs Topical Given 01/07/21 2358)    ED Course  I have reviewed the triage vital signs and the nursing notes.  Pertinent labs & imaging results that were available during my care of the patient were reviewed by me and considered in my medical decision making (see chart for details).    MDM Rules/Calculators/A&P                          Fall without evidence of intracranial injury.lac repaired as above with absorbable sutures. D/w husband s/s of infection, delayed head injury and expectant management.   Final Clinical Impression(s) / ED Diagnoses Final diagnoses:  Chin laceration, initial encounter  Injury of head, initial encounter    Rx  / DC Orders ED Discharge Orders     None        Eppie Barhorst, Barbara Cower, MD 01/08/21 [phone]

## 2021-01-11 ENCOUNTER — Telehealth: Payer: Self-pay | Admitting: Adult Health

## 2021-01-11 NOTE — Telephone Encounter (Signed)
Pt's son, Sapna Padron (on [place]) called, she is getting worse; do not recognize her husband, calling wanting to be taking home. Would like a call from the nurse.

## 2021-01-11 NOTE — Telephone Encounter (Signed)
Please let the patient' son know that this is a progressive disease and it is expected that more assistance will be needed as time goes on.  If the patient's husband is having more difficulty caring for the patient.  I would recommend looking at additional resources for example having someone come into the home to help.  We can also provide them with some of pur dementia resources.  Not sure that this warrants a sooner visit.  Please call the patient's son to discuss this with him.

## 2021-01-11 NOTE — Telephone Encounter (Signed)
Called son, Denyse Amass,  he had not heard back from MM/NP as yet so he was following up on phone call.  Wanted to make known that pt calling him at night, not recognizing husband,  (stating needing to leave).  Its hard on husband of pt.  Witnessed behavior by family when together of agitation.  Is on namenda (helps with slowing progression of dementia) but is not absolute.  Other meds helps with agitation, hallucinations, delusions, but do not want to knock pts out , safety and fall risk.  Has appt 03-24-21 will look for one sooner.  He just wanted documentation. I relayed to continue with mychart and will look prior to appts for information.  He will try to be at appt but is not always able so documentation is good to have. He appreciated call back.

## 2021-02-17 DIAGNOSIS — F322 Major depressive disorder, single episode, severe without psychotic features: Secondary | ICD-10-CM | POA: Diagnosis not present

## 2021-02-17 DIAGNOSIS — K219 Gastro-esophageal reflux disease without esophagitis: Secondary | ICD-10-CM | POA: Diagnosis not present

## 2021-02-17 DIAGNOSIS — M8580 Other specified disorders of bone density and structure, unspecified site: Secondary | ICD-10-CM | POA: Diagnosis not present

## 2021-02-17 DIAGNOSIS — E039 Hypothyroidism, unspecified: Secondary | ICD-10-CM | POA: Diagnosis not present

## 2021-02-17 DIAGNOSIS — N183 Chronic kidney disease, stage 3 unspecified: Secondary | ICD-10-CM | POA: Diagnosis not present

## 2021-02-17 DIAGNOSIS — E7800 Pure hypercholesterolemia, unspecified: Secondary | ICD-10-CM | POA: Diagnosis not present

## 2021-02-17 DIAGNOSIS — M810 Age-related osteoporosis without current pathological fracture: Secondary | ICD-10-CM | POA: Diagnosis not present

## 2021-03-22 ENCOUNTER — Telehealth: Payer: Self-pay | Admitting: Adult Health

## 2021-03-22 NOTE — Telephone Encounter (Signed)
Noted will give to MM NP.

## 2021-03-22 NOTE — Telephone Encounter (Signed)
Pt's husband has called to report he will try his best to have pt here on Thursday at 1 for her appointment. He asked it be noted that pt's memory has worsen, pt's husband states pt's memory is about the duration of 10 seconds.  Pt's husband states there are times pt wakes up not recalling that she is married or even where she is.  He also asked it be noted that in an attempt to save the vision in pt's right eye he gives pt daily treatments to help vision. This is all FYI no call back requested.

## 2021-03-24 ENCOUNTER — Encounter: Payer: Self-pay | Admitting: Adult Health

## 2021-03-24 ENCOUNTER — Ambulatory Visit: Payer: Medicare HMO | Admitting: Adult Health

## 2021-03-24 VITALS — BP 104/68 | HR 90 | Ht 61.0 in | Wt 117.0 lb

## 2021-03-24 DIAGNOSIS — F0390 Unspecified dementia without behavioral disturbance: Secondary | ICD-10-CM | POA: Diagnosis not present

## 2021-03-24 MED ORDER — MEMANTINE HCL ER 28 MG PO CP24: 28.0000 mg | ORAL_CAPSULE | Freq: Every day | ORAL | 3 refills | Status: DC

## 2021-03-24 NOTE — Patient Instructions (Signed)
Your Plan:  Stop namenda Start Namenda XR 28 mg daily  If your symptoms worsen or you develop new symptoms please let us know.    Thank you for coming to see [place] at [facility]. I hope we have been able to provide you high quality care today.  You may receive a patient satisfaction survey over the next few weeks. We would appreciate your feedback and comments so that we may continue to improve ourselves and the health of our patients.

## 2021-03-24 NOTE — Progress Notes (Signed)
PATIENT: Jessica Whitney DOB: 02/09/1946  REASON FOR VISIT: follow up HISTORY FROM: patient  HISTORY OF PRESENT ILLNESS: Today 03/24/21:  Ms. Jessica Whitney is a 75 year old female with a history of dementia.  She returns today for follow-up.  She is here today with her husband and son.  The husband reports that typically in the afternoons she will come more confused.  She states that she is in the wrong home and wants to pack up her belongings.  He states by the next morning this has resolved and she unpacked her belongings.  She is able to complete all ADLs independently.  She does not operate a motor vehicle.  He helps manage her medications and appointments.  He has not looked into extra help in the home.  He does report that he sometimes has trouble getting her to take her nighttime medications. she returns today for an evaluation.  09/16/20: Ms. Jessica Whitney is a 75 year old female with a history of dementia.  She returns today for follow-up.  She is here today with her husband.  Husband reports that she continues to have trouble with her short-term memory.  Reports that they went to a birthday party and she did not remember it 2 hours later.  She also sometimes forgets that she is married to him.  She is able to complete all ADLs independently.  Denies any trouble sleeping.  Mood and behavior has remained relatively stable.  She is currently on Namenda 10 mg twice a day.  Husband reports that he is having a hard time getting her to take her nighttime medication.  06/03/20:Ms. Jessica Whitney is  a 75 year old female with a history of dementia.  She returns today for follow-up.  She is here today with her husband and son.  They both feel that her memory has gotten worse.  The patient feels that her memory is stable.  They report that she continues to be repetitive.  They have a hard time getting her to go to places.  Reports that they had a hard time getting her car to come to this appointment today.  At home  patient is able to complete all ADLs independently.  She does not operate a motor vehicle.  She does not do any cooking.  She remains on Namenda 10 mg twice a day.  No hallucinations or delusions.  No significant change in her mood or behavior.   03/16/20:Ms. Jessica Whitney is a 75 year old female with a history of dementia.  She returns today for follow-up.  Her husband is with her today.  He feels that her memory has gotten worse.  He states that there is been a gradual decline over the last several months.  She is still able to complete all ADLs independently.  She does not operate a motor vehicle.  He states that she tends to fall asleep on the couch and sleeps in late the next day.  Patient reports good appetite.  Denies any change in mood or behavior.  No hallucinations or delusions.  Has reports that she can be repetitive at times.  She is currently taking Namenda 5 mg twice a day   HISTORY 12/02/19:   Ms. Jessica Whitney is a 75 year old female with a history of dementia.  She returns today for follow-up.  She is here today with her husband and her son.  They feel that over the last year her memory has gotten worse.  She lives at home with her husband.  She is able to complete all  ADLs independently.  She no longer operates a motor vehicle.  She no longer cooks but the husband does not feel that this its due to her memory.  He reports that at bedtime sometimes she forgets that she is married and would ask him to sleep in another room.  Her son also notes that sometimes she may not remember things about her grandchildren.  She was on Aricept but was taken off of this medication due to weight loss.  Although the family reports that she had acute pancreatitis and that is what contributed to the weight loss she returns today for an evaluation.  REVIEW OF SYSTEMS: Out of a complete 14 system review of symptoms, the patient complains only of the following symptoms, and all other reviewed systems are negative.  See  HPI  ALLERGIES: Allergies  Allergen Reactions   Sulfa Antibiotics Other (See Comments)    Caused mouth sores    HOME MEDICATIONS: Outpatient Medications Prior to Visit  Medication Sig Dispense Refill   DULoxetine HCl 40 MG CPEP Take 1 tablet by mouth in the morning and at bedtime. 2 tablets per day     famotidine (PEPCID) 40 MG tablet Take 40 mg by mouth daily.     levothyroxine (SYNTHROID) 100 MCG tablet Take 100 mcg by mouth daily before breakfast.     lidocaine (LIDODERM) 5 % Place 1 patch onto the skin daily. Remove & Discard patch within 12 hours or as directed by MD. Lower back 30 patch 0   memantine (NAMENDA) 10 MG tablet Take 1 tablet (10 mg total) by mouth 2 (two) times daily. 180 tablet 3   OXERVATE 0.002 % SOLN Apply 0.002 drops to eye 6 (six) times daily.     valACYclovir (VALTREX) 500 MG tablet Take by mouth 2 (two) times daily.     vitamin B-12 (CYANOCOBALAMIN) 1000 MCG tablet Take 1,000 mcg by mouth daily.     No facility-administered medications prior to visit.    PAST MEDICAL HISTORY: Past Medical History:  Diagnosis Date   Acute pancreatitis 08/30/2019   Arthritis    Diverticulitis    Fibromyalgia    GERD (gastroesophageal reflux disease)    bucchini   Hypokalemia 08/30/2019   Hypothyroidism 08/30/2019   Major depressive disorder in remission 08/30/2019   Major neurocognitive disorder due to Alzheimer's disease 09/22/2019   PONV (postoperative nausea and vomiting)    Spinal stenosis    Vitamin B12 deficiency    Vitamin D deficiency     PAST SURGICAL HISTORY: Past Surgical History:  Procedure Laterality Date   APPENDECTOMY     BACK SURGERY     bone spur shoulder Bilateral    BREAST EXCISIONAL BIOPSY Right    BREAST SURGERY Right    lumpectomy non cancerous   CHOLECYSTECTOMY     COLON SURGERY     foot of colon removed - diverticulitis   EYE SURGERY Left    cross-eye fixed   JOINT REPLACEMENT Right    knee   knee arthroscopic Right    LUMBAR  LAMINECTOMY/DECOMPRESSION MICRODISCECTOMY N/A 04/03/2013   Procedure: Lumbar Four-Five Lumbar diskectomy with Coflex;  Surgeon: Reinaldo Meeker, MD;  Location: [facility];  Service: Neurosurgery;  Laterality: N/A;  L4-5 Lumbar diskectomy with Coflex   TRIGGER FINGER RELEASE Right    x2   TUBAL LIGATION      FAMILY HISTORY: Family History  Problem Relation Age of Onset   Memory loss Neg Hx     SOCIAL  HISTORY: Social History   Socioeconomic History   Marital status: Married    Spouse name: Not on file   Number of children: Not on file   Years of education: 13   Highest education level: Some college, no degree  Occupational History   Occupation: Retired    Comment: Geophysicist/field seismologist for 20 years  Tobacco Use   Smoking status: Former   Smokeless tobacco: Never  Building services engineer Use: Never used  Substance and Sexual Activity   Alcohol use: Not Currently    Alcohol/week: 0.0 standard drinks    Comment: very rare glass of wine   Drug use: No   Sexual activity: Not on file  Other Topics Concern   Not on file  Social History Narrative   Not on file   Social Determinants of Health   Financial Resource Strain: Not on file  Food Insecurity: Not on file  Transportation Needs: Not on file  Physical Activity: Not on file  Stress: Not on file  Social Connections: Not on file  Intimate Partner Violence: Not on file      PHYSICAL EXAM  Vitals:   03/24/21 1318  BP: 104/68  Pulse: 90  Weight: 117 lb (53.1 kg)  Height: 5\' 1"  (1.549 m)   Body mass index is 22.11 kg/m.   MMSE - Mini Mental State Exam 03/24/2021 09/16/2020 06/03/2020  Orientation to time 2 0 1  Orientation to Place 4 3 4   Registration 3 3 3   Attention/ Calculation 0 2 1  Recall 0 0 0  Language- name 2 objects 2 2 2   Language- repeat 0 1 0  Language- follow 3 step command 3 3 3   Language- read & follow direction 1 1 1   Write a sentence 1 1 1   Copy design 1 1 1   Copy design-comments - - -  Total  score 17 17 17    will 25-year-old disclosure they are comfortable  Generalized: Well developed, in no acute distress   Neurological examination  Mentation: Alert oriented to time, place, history taking. Follows all commands speech and language fluent Cranial nerve II-XII: Pupils were equal round reactive to light. Extraocular movements were full, visual field were full on confrontational test. Facial sensation and strength were normal. Uvula tongue midline. Head turning and shoulder shrug  were normal and symmetric. Motor: The motor testing reveals 5 over 5 strength of all 4 extremities. Good symmetric motor tone is noted throughout.  Sensory: Sensory testing is intact to soft touch on all 4 extremities. No evidence of extinction is noted.  Coordination: Cerebellar testing reveals good finger-nose-finger and heel-to-shin bilaterally.  Gait and station: Gait is normal.  Reflexes: Deep tendon reflexes are symmetric and normal bilaterally.   DIAGNOSTIC DATA (LABS, IMAGING, TESTING) - I reviewed patient records, labs, notes, testing and imaging myself where available.  Lab Results  Component Value Date   WBC 17.8 (H) 09/06/2019   HGB 11.2 (L) 09/06/2019   HCT 34.1 (L) 09/06/2019   MCV 88.6 09/06/2019   PLT 242 09/06/2019      Component Value Date/Time   NA 134 (L) 09/06/2019 0444   K 4.2 09/06/2019 0444   CL 103 09/06/2019 0444   CO2 23 09/06/2019 0444   GLUCOSE 109 (H) 09/06/2019 0444   BUN 6 (L) 09/06/2019 0444   CREATININE 0.71 09/06/2019 0444   CALCIUM 7.4 (L) 09/06/2019 0444   PROT 4.9 (L) 09/05/2019 0517   ALBUMIN 2.3 (L) 09/05/2019 0517   AST  48 (H) 09/05/2019 0517   ALT 36 09/05/2019 0517   ALKPHOS 179 (H) 09/05/2019 0517   BILITOT 0.9 09/05/2019 0517   GFRNONAA >60 09/06/2019 0444   GFRAA >60 09/06/2019 0444    Lab Results  Component Value Date   TSH 2.229 09/01/2019      ASSESSMENT AND PLAN 75 y.o. year old female  has a past medical history of Acute  pancreatitis (08/30/2019), Arthritis, Diverticulitis, Fibromyalgia, GERD (gastroesophageal reflux disease), Hypokalemia (08/30/2019), Hypothyroidism (08/30/2019), Major depressive disorder in remission (08/30/2019), Major neurocognitive disorder due to Alzheimer's disease (09/22/2019), PONV (postoperative nausea and vomiting), Spinal stenosis, Vitamin B12 deficiency, and Vitamin D deficiency. here with :  1.  Dementia  Memory score MMSE 17/30 previously 17/30--stable We will switch to Namenda XR 28 mg daily Advised that they should consider additional help in the home or possibly a day program FU 6 months or sooner if needed     Butch Penny, MSN, NP-C 03/24/2021, 1:25 PM [facility] [address], [address] [place], [place] [address] [phone]

## 2021-03-30 DIAGNOSIS — H16233 Neurotrophic keratoconjunctivitis, bilateral: Secondary | ICD-10-CM | POA: Diagnosis not present

## 2021-03-30 DIAGNOSIS — H43813 Vitreous degeneration, bilateral: Secondary | ICD-10-CM | POA: Diagnosis not present

## 2021-03-30 DIAGNOSIS — H18593 Other hereditary corneal dystrophies, bilateral: Secondary | ICD-10-CM | POA: Diagnosis not present

## 2021-03-30 DIAGNOSIS — B0052 Herpesviral keratitis: Secondary | ICD-10-CM | POA: Diagnosis not present

## 2021-05-11 DIAGNOSIS — H43813 Vitreous degeneration, bilateral: Secondary | ICD-10-CM | POA: Diagnosis not present

## 2021-05-11 DIAGNOSIS — B0052 Herpesviral keratitis: Secondary | ICD-10-CM | POA: Diagnosis not present

## 2021-05-11 DIAGNOSIS — H16233 Neurotrophic keratoconjunctivitis, bilateral: Secondary | ICD-10-CM | POA: Diagnosis not present

## 2021-05-11 DIAGNOSIS — H18593 Other hereditary corneal dystrophies, bilateral: Secondary | ICD-10-CM | POA: Diagnosis not present

## 2021-06-01 DIAGNOSIS — E039 Hypothyroidism, unspecified: Secondary | ICD-10-CM | POA: Diagnosis not present

## 2021-06-01 DIAGNOSIS — K219 Gastro-esophageal reflux disease without esophagitis: Secondary | ICD-10-CM | POA: Diagnosis not present

## 2021-06-01 DIAGNOSIS — F322 Major depressive disorder, single episode, severe without psychotic features: Secondary | ICD-10-CM | POA: Diagnosis not present

## 2021-06-01 DIAGNOSIS — E7800 Pure hypercholesterolemia, unspecified: Secondary | ICD-10-CM | POA: Diagnosis not present

## 2021-06-01 DIAGNOSIS — M8580 Other specified disorders of bone density and structure, unspecified site: Secondary | ICD-10-CM | POA: Diagnosis not present

## 2021-06-01 DIAGNOSIS — M810 Age-related osteoporosis without current pathological fracture: Secondary | ICD-10-CM | POA: Diagnosis not present

## 2021-06-01 DIAGNOSIS — N183 Chronic kidney disease, stage 3 unspecified: Secondary | ICD-10-CM | POA: Diagnosis not present

## 2021-06-29 DIAGNOSIS — H18593 Other hereditary corneal dystrophies, bilateral: Secondary | ICD-10-CM | POA: Diagnosis not present

## 2021-06-29 DIAGNOSIS — H16233 Neurotrophic keratoconjunctivitis, bilateral: Secondary | ICD-10-CM | POA: Diagnosis not present

## 2021-06-29 DIAGNOSIS — B0052 Herpesviral keratitis: Secondary | ICD-10-CM | POA: Diagnosis not present

## 2021-06-29 DIAGNOSIS — H53032 Strabismic amblyopia, left eye: Secondary | ICD-10-CM | POA: Diagnosis not present

## 2021-08-16 DIAGNOSIS — E538 Deficiency of other specified B group vitamins: Secondary | ICD-10-CM | POA: Diagnosis not present

## 2021-08-16 DIAGNOSIS — N183 Chronic kidney disease, stage 3 unspecified: Secondary | ICD-10-CM | POA: Diagnosis not present

## 2021-08-16 DIAGNOSIS — E7800 Pure hypercholesterolemia, unspecified: Secondary | ICD-10-CM | POA: Diagnosis not present

## 2021-08-16 DIAGNOSIS — K219 Gastro-esophageal reflux disease without esophagitis: Secondary | ICD-10-CM | POA: Diagnosis not present

## 2021-08-16 DIAGNOSIS — M810 Age-related osteoporosis without current pathological fracture: Secondary | ICD-10-CM | POA: Diagnosis not present

## 2021-08-16 DIAGNOSIS — Z0000 Encounter for general adult medical examination without abnormal findings: Secondary | ICD-10-CM | POA: Diagnosis not present

## 2021-08-16 DIAGNOSIS — F322 Major depressive disorder, single episode, severe without psychotic features: Secondary | ICD-10-CM | POA: Diagnosis not present

## 2021-08-16 DIAGNOSIS — E559 Vitamin D deficiency, unspecified: Secondary | ICD-10-CM | POA: Diagnosis not present

## 2021-08-16 DIAGNOSIS — E039 Hypothyroidism, unspecified: Secondary | ICD-10-CM | POA: Diagnosis not present

## 2021-08-22 ENCOUNTER — Other Ambulatory Visit: Payer: Self-pay | Admitting: Family Medicine

## 2021-08-22 DIAGNOSIS — M810 Age-related osteoporosis without current pathological fracture: Secondary | ICD-10-CM

## 2021-08-22 DIAGNOSIS — Z1231 Encounter for screening mammogram for malignant neoplasm of breast: Secondary | ICD-10-CM

## 2021-08-30 DIAGNOSIS — H168 Other keratitis: Secondary | ICD-10-CM | POA: Diagnosis not present

## 2021-08-30 DIAGNOSIS — H16223 Keratoconjunctivitis sicca, not specified as Sjogren's, bilateral: Secondary | ICD-10-CM | POA: Diagnosis not present

## 2021-09-22 ENCOUNTER — Encounter: Payer: Self-pay | Admitting: Adult Health

## 2021-09-22 ENCOUNTER — Ambulatory Visit: Payer: Medicare HMO | Admitting: Adult Health

## 2021-09-22 VITALS — BP 91/49 | HR 84 | Ht 62.0 in | Wt 112.6 lb

## 2021-09-22 DIAGNOSIS — F03B0 Unspecified dementia, moderate, without behavioral disturbance, psychotic disturbance, mood disturbance, and anxiety: Secondary | ICD-10-CM | POA: Diagnosis not present

## 2021-09-22 NOTE — Patient Instructions (Signed)
Your Plan:  Continue Namenda XR 28 mg daily If your symptoms worsen or you develop new symptoms please let us know.   Thank you for coming to see us at [facility]. I hope we have been able to provide you high quality care today.  You may receive a patient satisfaction survey over the next few weeks. We would appreciate your feedback and comments so that we may continue to improve ourselves and the health of our patients.  

## 2021-09-22 NOTE — Progress Notes (Signed)
? ? ?PATIENT: Jessica Whitney ?DOB: [birth year] ? ?REASON FOR VISIT: follow up ?HISTORY FROM: patient ? ?HISTORY OF PRESENT ILLNESS: ?Today 09/22/21: ? ?Ms. Jessica Whitney is a 76 year old female with a history of memory disturbance.  She returns today for follow-up.  She is here today with her husband.  Her son was not able to make this appointment.  Reports that she feels that her memory is stable. Husband reports that he helps with her medications, appointments. Husband reports that she needs eye drops 6 times a day so he has to help her with that. Sometimes refuses nighttime medication. Does not drive- husband has to tell her that her car is in the shop in order to keep her from obsessing over driving. Some agitation noted. Sometimes doesn't recognize her home.  Continues to tolerate Namenda XR well.  Husband states that has been helpful to have the extended release form ? ?03/24/21: Ms. Jessica Whitney is a 76 year old female with a history of dementia.  She returns today for follow-up.  She is here today with her husband and son.  The husband reports that typically in the afternoons she will come more confused.  She states that she is in the wrong home and wants to pack up her belongings.  He states by the next morning this has resolved and she unpacked her belongings.  She is able to complete all ADLs independently.  She does not operate a motor vehicle.  He helps manage her medications and appointments.  He has not looked into extra help in the home.  He does report that he sometimes has trouble getting her to take her nighttime medications. she returns today for an evaluation. ? ?09/16/20: Ms. Jessica Whitney is a 76 year old female with a history of dementia.  She returns today for follow-up.  She is here today with her husband.  Husband reports that she continues to have trouble with her short-term memory.  Reports that they went to a birthday party and she did not remember it 2 hours later.  She also sometimes forgets that she is  married to him.  She is able to complete all ADLs independently.  Denies any trouble sleeping.  Mood and behavior has remained relatively stable.  She is currently on Namenda 10 mg twice a day.  Husband reports that he is having a hard time getting her to take her nighttime medication. ? ?06/03/20:Ms. Jessica Whitney is ? a 76 year old female with a history of dementia.  She returns today for follow-up.  She is here today with her husband and son.  They both feel that her memory has gotten worse.  The patient feels that her memory is stable.  They report that she continues to be repetitive.  They have a hard time getting her to go to places.  Reports that they had a hard time getting her car to come to this appointment today.  At home patient is able to complete all ADLs independently.  She does not operate a motor vehicle.  She does not do any cooking.  She remains on Namenda 10 mg twice a day.  No hallucinations or delusions.  No significant change in her mood or behavior. ? ? ?03/16/20:Ms. Jessica Whitney is a 76 year old female with a history of dementia.  She returns today for follow-up.  Her husband is with her today.  He feels that her memory has gotten worse.  He states that there is been a gradual decline over the last several months.  She is still able to  complete all ADLs independently.  She does not operate a motor vehicle.  He states that she tends to fall asleep on the couch and sleeps in late the next day.  Patient reports good appetite.  Denies any change in mood or behavior.  No hallucinations or delusions.  Has reports that she can be repetitive at times.  She is currently taking Namenda 5 mg twice a day ? ? ?HISTORY 12/02/19: ?  ?Ms. Jessica Whitney is a 76 year old female with a history of dementia.  She returns today for follow-up.  She is here today with her husband and her son.  They feel that over the last year her memory has gotten worse.  She lives at home with her husband.  She is able to complete all ADLs  independently.  She no longer operates a motor vehicle.  She no longer cooks but the husband does not feel that this its due to her memory.  He reports that at bedtime sometimes she forgets that she is married and would ask him to sleep in another room.  Her son also notes that sometimes she may not remember things about her grandchildren.  She was on Aricept but was taken off of this medication due to weight loss.  Although the family reports that she had acute pancreatitis and that is what contributed to the weight loss she returns today for an evaluation. ? ?REVIEW OF SYSTEMS: Out of a complete 14 system review of symptoms, the patient complains only of the following symptoms, and all other reviewed systems are negative. ? ?See HPI ? ?ALLERGIES: ?Allergies  ?Allergen Reactions  ? Sulfa Antibiotics Other (See Comments)  ?  Caused mouth sores  ? ? ?HOME MEDICATIONS: ?Outpatient Medications Prior to Visit  ?Medication Sig Dispense Refill  ? DULoxetine HCl 40 MG CPEP Take 1 tablet by mouth in the morning and at bedtime. 2 tablets per day    ? famotidine (PEPCID) 40 MG tablet Take 40 mg by mouth daily.    ? levothyroxine (SYNTHROID) 100 MCG tablet Take 100 mcg by mouth daily before breakfast.    ? lidocaine (LIDODERM) 5 % Place 1 patch onto the skin daily. Remove & Discard patch within 12 hours or as directed by MD. Lower back 30 patch 0  ? memantine (NAMENDA XR) 28 MG CP24 24 hr capsule Take 1 capsule (28 mg total) by mouth daily. 90 capsule 3  ? OXERVATE 0.002 % SOLN Apply 0.002 drops to eye 6 (six) times daily.    ? UNABLE TO FIND Med Name: IVIZIA eye drops    ? valACYclovir (VALTREX) 500 MG tablet Take by mouth 2 (two) times daily.    ? vitamin B-12 (CYANOCOBALAMIN) 1000 MCG tablet Take 1,000 mcg by mouth daily.    ? Vitamin D, Ergocalciferol, (DRISDOL) 1.25 MG (50000 UNIT) CAPS capsule Take 50,000 Units by mouth once a week.    ? ?No facility-administered medications prior to visit.  ? ? ?PAST MEDICAL  HISTORY: ?Past Medical History:  ?Diagnosis Date  ? Acute pancreatitis 08/30/2019  ? Arthritis   ? Diverticulitis   ? Fibromyalgia   ? GERD (gastroesophageal reflux disease)   ? bucchini  ? Hypokalemia 08/30/2019  ? Hypothyroidism 08/30/2019  ? Major depressive disorder in remission 08/30/2019  ? Major neurocognitive disorder due to Alzheimer's disease 09/22/2019  ? PONV (postoperative nausea and vomiting)   ? Spinal stenosis   ? Vitamin B12 deficiency   ? Vitamin D deficiency   ? ? ?PAST SURGICAL  HISTORY: ?Past Surgical History:  ?Procedure Laterality Date  ? APPENDECTOMY    ? BACK SURGERY    ? bone spur shoulder Bilateral   ? BREAST EXCISIONAL BIOPSY Right   ? BREAST SURGERY Right   ? lumpectomy non cancerous  ? CHOLECYSTECTOMY    ? COLON SURGERY    ? foot of colon removed - diverticulitis  ? EYE SURGERY Left   ? cross-eye fixed  ? JOINT REPLACEMENT Right   ? knee  ? knee arthroscopic Right   ? LUMBAR LAMINECTOMY/DECOMPRESSION MICRODISCECTOMY N/A 04/03/2013  ? Procedure: Lumbar Four-Five Lumbar diskectomy with Coflex;  Surgeon: Reinaldo Meeker, MD;  Location: [facility];  Service: Neurosurgery;  Laterality: N/A;  L4-5 Lumbar diskectomy with Coflex  ? TRIGGER FINGER RELEASE Right   ? x2  ? TUBAL LIGATION    ? ? ?FAMILY HISTORY: ?Family History  ?Problem Relation Age of Onset  ? Memory loss Neg Hx   ? ? ?SOCIAL HISTORY: ?Social History  ? ?Socioeconomic History  ? Marital status: Married  ?  Spouse name: Not on file  ? Number of children: Not on file  ? Years of education: 65  ? Highest education level: Some college, no degree  ?Occupational History  ? Occupation: Retired  ?  Comment: Geophysicist/field seismologist for 20 years  ?Tobacco Use  ? Smoking status: Former  ? Smokeless tobacco: Never  ?Vaping Use  ? Vaping Use: Never used  ?Substance and Sexual Activity  ? Alcohol use: Not Currently  ?  Alcohol/week: 0.0 standard drinks  ?  Comment: very rare glass of wine  ? Drug use: No  ? Sexual activity: Not on file  ?Other Topics  Concern  ? Not on file  ?Social History Narrative  ? Not on file  ? ?Social Determinants of Health  ? ?Financial Resource Strain: Not on file  ?Food Insecurity: Not on file  ?Transportation Needs: Not on file  ?Physical Activity:

## 2021-09-27 DIAGNOSIS — E039 Hypothyroidism, unspecified: Secondary | ICD-10-CM | POA: Diagnosis not present

## 2021-12-06 IMAGING — CR DG HAND COMPLETE 3+V*R*
3 series · 3 of 3 positions shown · non-contrast
Comparison: None.

CLINICAL DATA: Fall, right hand pain

EXAM:
RIGHT HAND - COMPLETE 3+ VIEW

[x hand pa right]
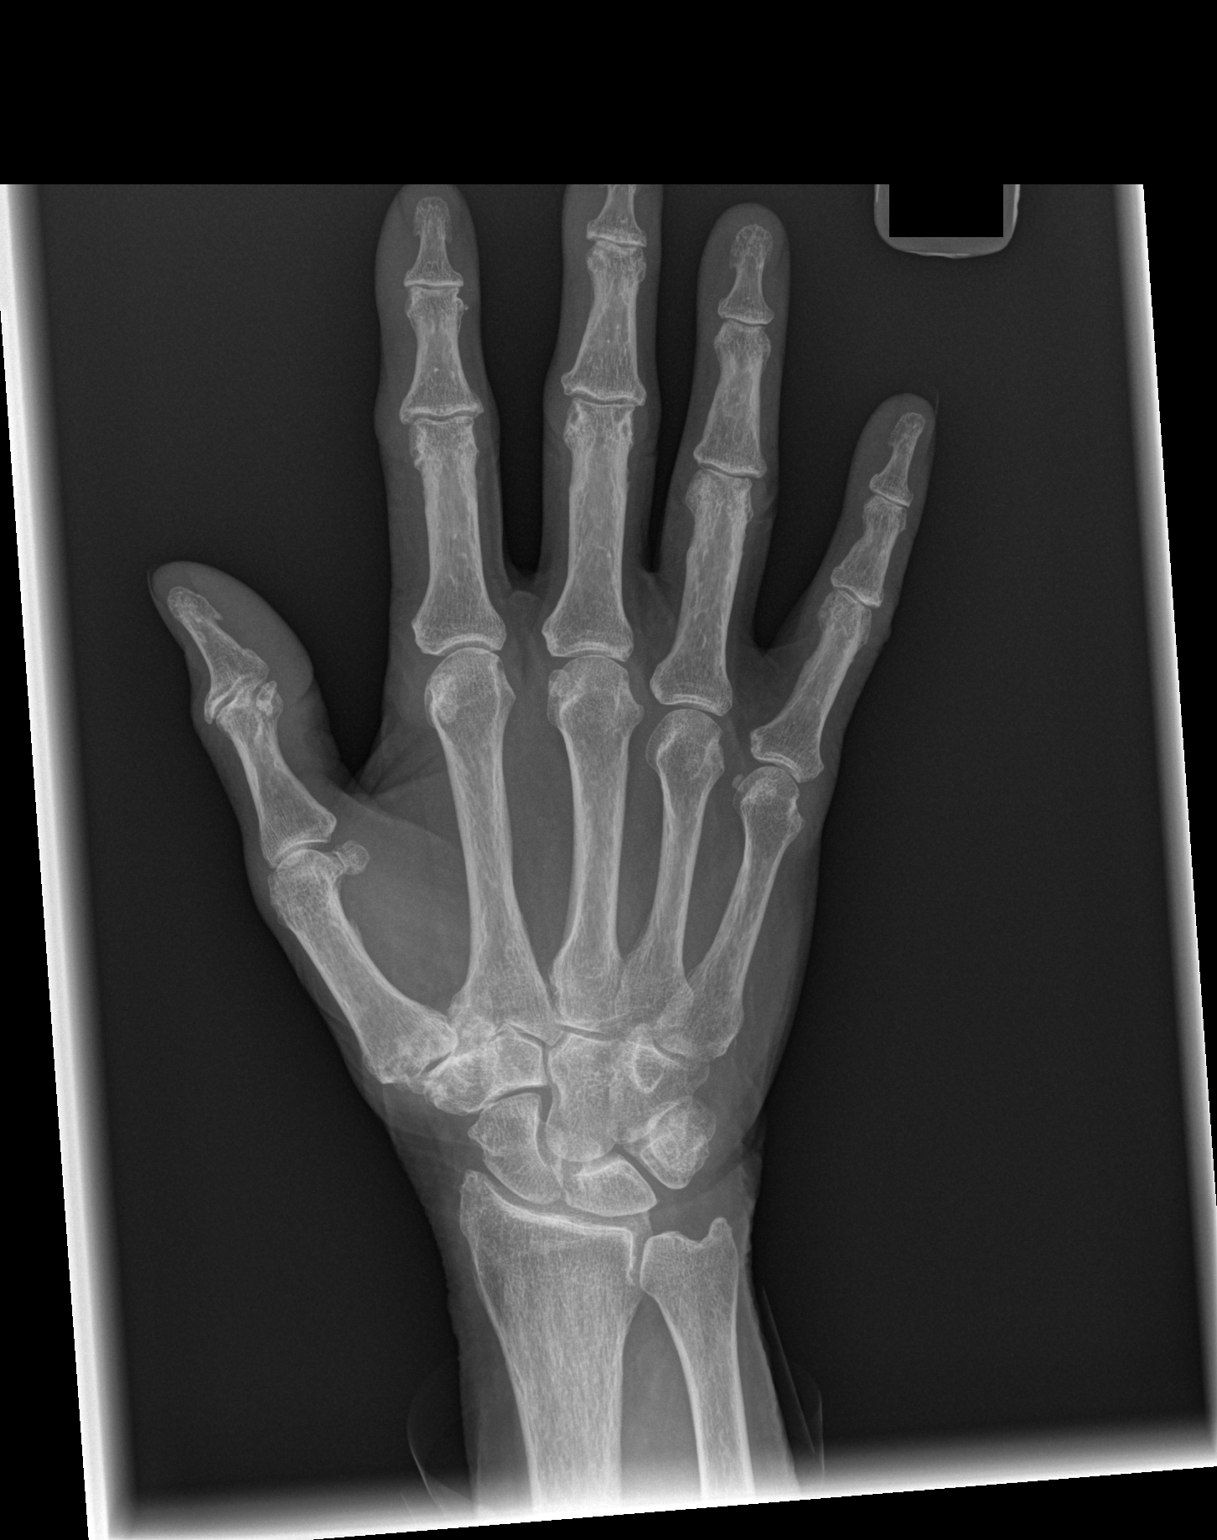

[x hand oblique right]
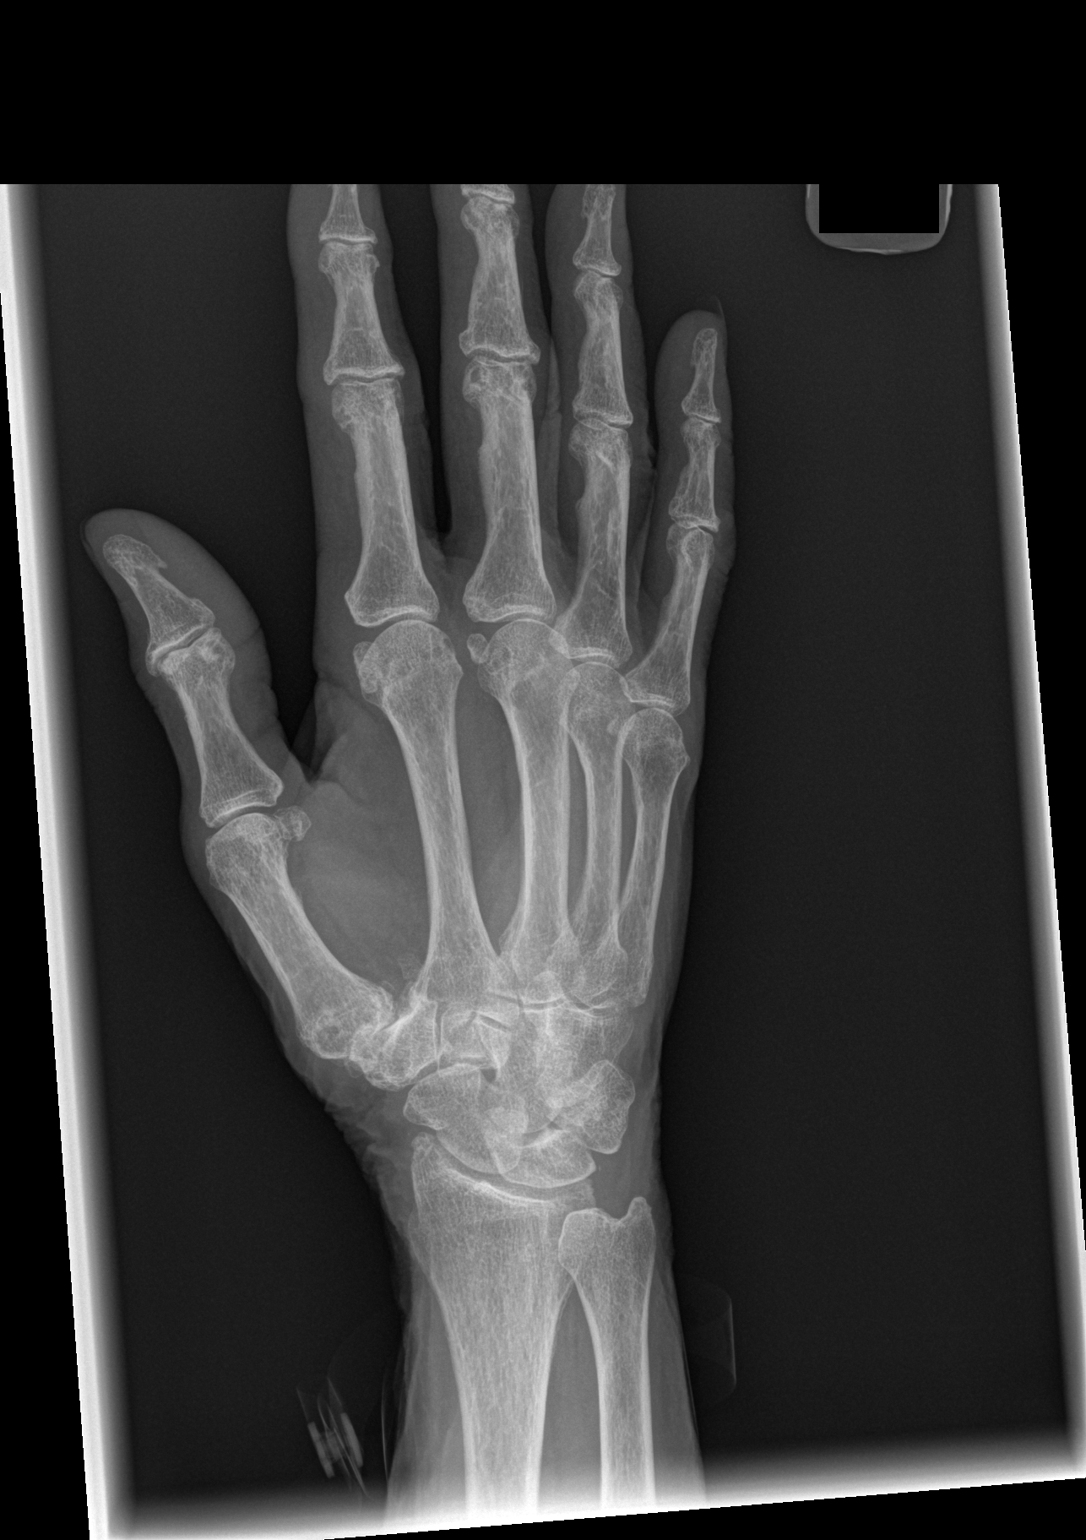

[x hand lat right]
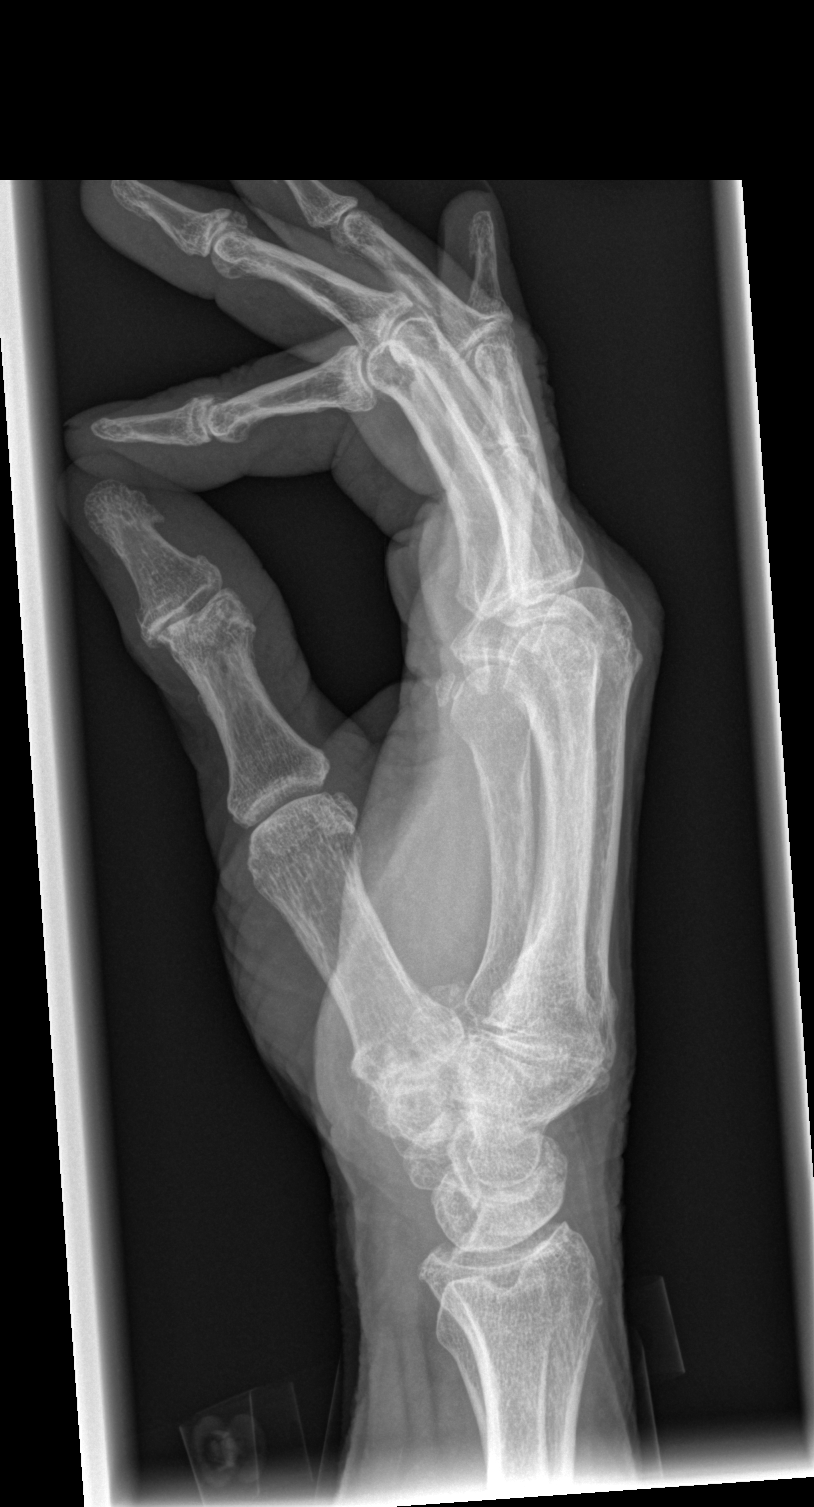

[3 of 3 positions shown; findings below may reference images not displayed]

FINDINGS: Three view radiograph right hand demonstrates normal alignment. No
acute fracture or dislocation. Moderate degenerative arthritis noted
at the first carpometacarpal joint at the base of the thumb. Milder
degenerative changes are noted involving the interphalangeal joint
of the thumb and second and third DIP joints. Remaining joint spaces
are preserved. Soft tissues are unremarkable.
IMPRESSION: No acute fracture or dislocation.

## 2021-12-06 IMAGING — CT CT MAXILLOFACIAL W/O CM
3 series · 16 of 47 positions shown, 19 images · non-contrast
Comparison: 04/09/2015

CLINICAL DATA: Fell 2 hours ago, left-sided facial injury

EXAM:
CT MAXILLOFACIAL WITHOUT CONTRAST
TECHNIQUE: Multidetector CT imaging of the maxillofacial structures was
performed. Multiplanar CT image reconstructions were also generated.

[Series 2: max soft · axial · 0.35mm/px · z∈[-290,-140]mm · 10 of 88 slices shown, 13 images]
[im 7/88  brain]
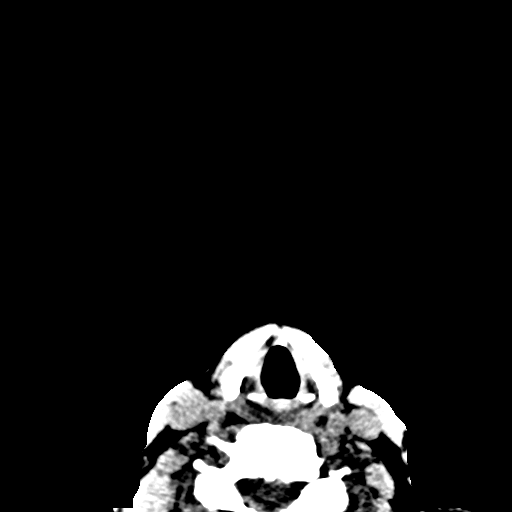
[im 7/88  bone]
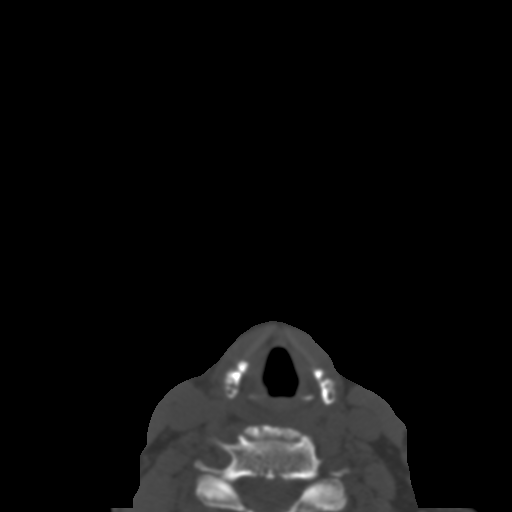
[im 16/88  bone]
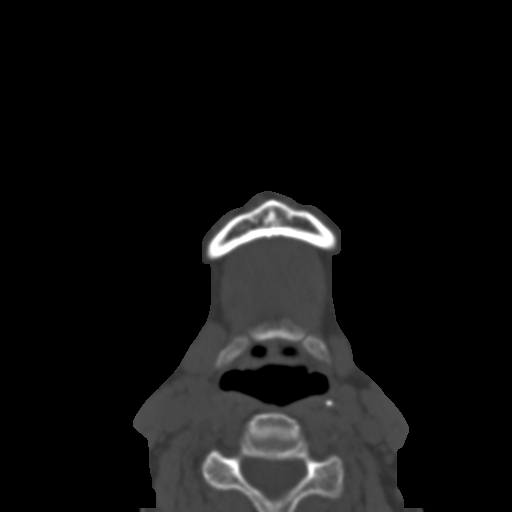
[im 25/88  bone]
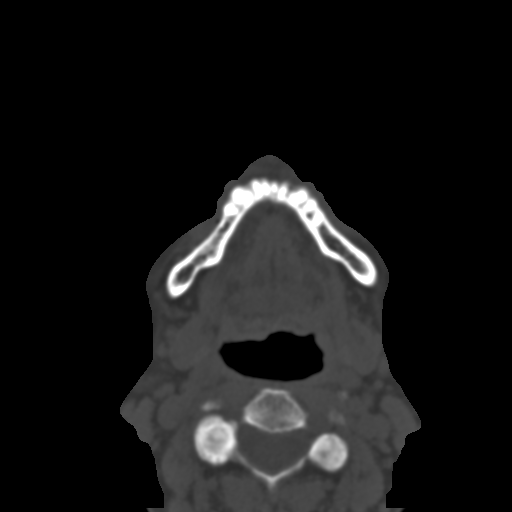
[im 31/88  bone]
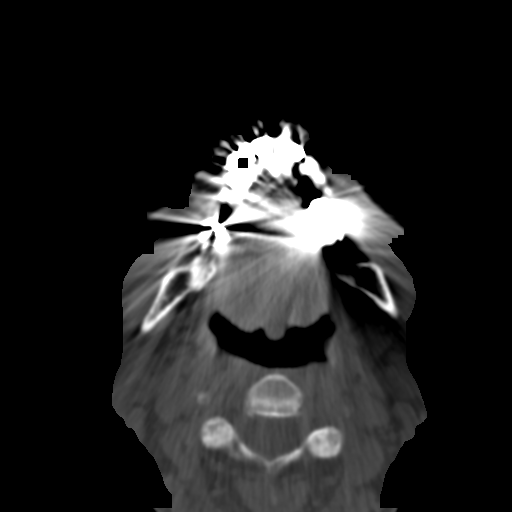
[im 40/88  brain]
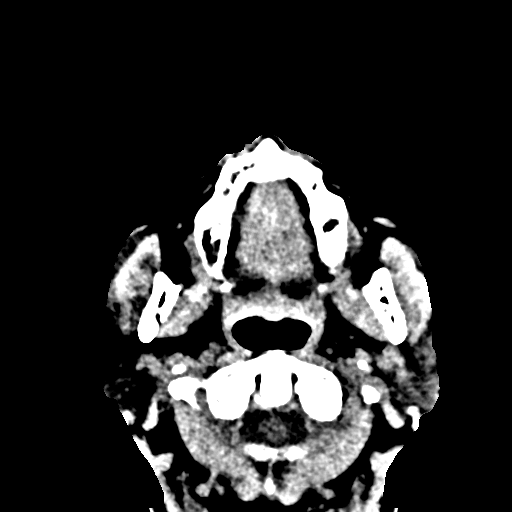
[im 40/88  bone]
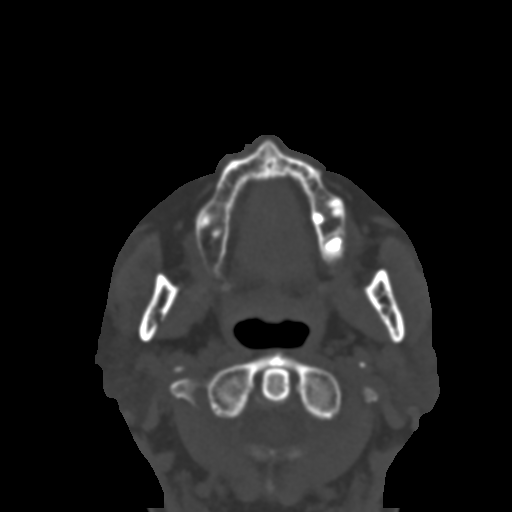
[im 49/88  bone]
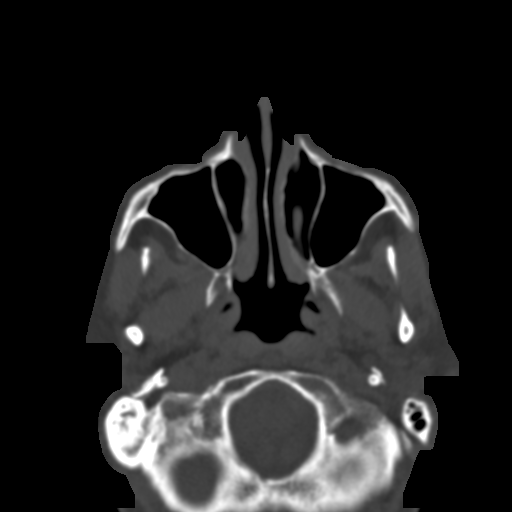
[im 58/88  bone]
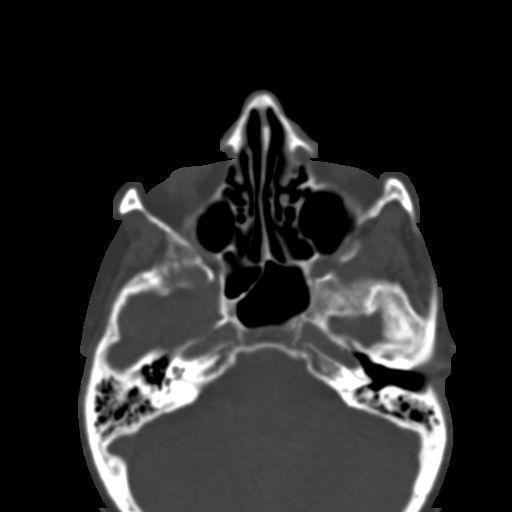
[im 67/88  bone]
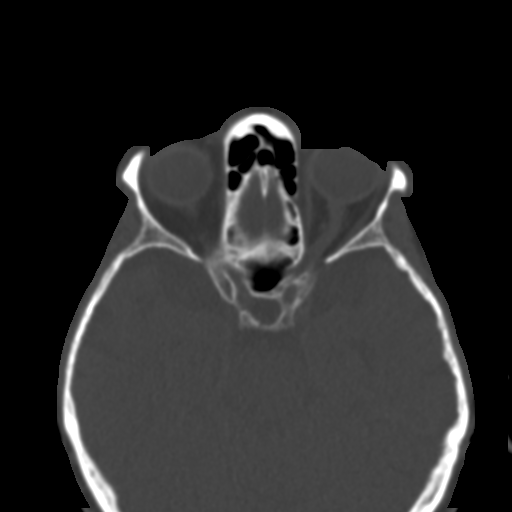
[im 73/88  brain]
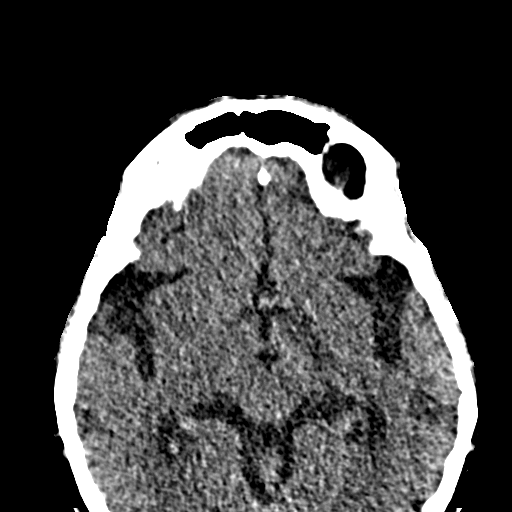
[im 73/88  bone]
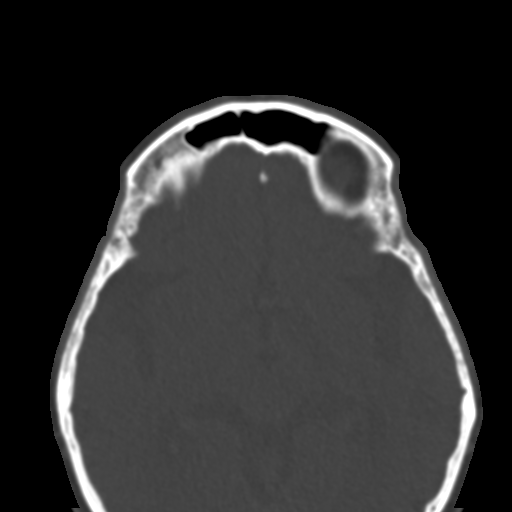
[im 82/88  bone]
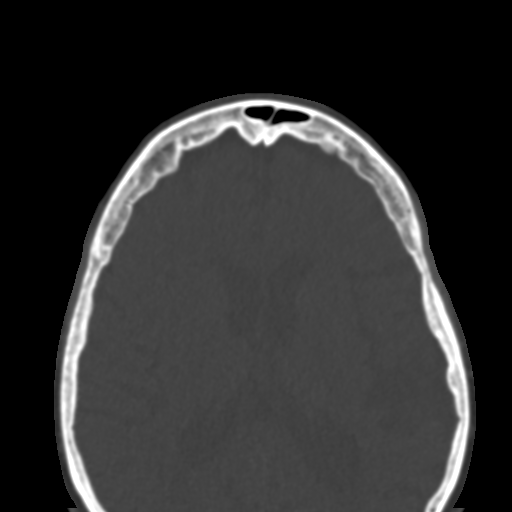

[Series 6: coronal soft · coronal · 0.36mm/px · 3 of 83 slices shown]
[im 28/83  bone]
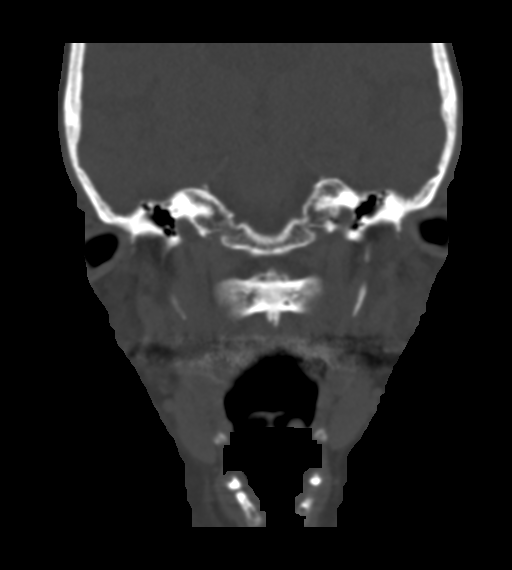
[im 37/83  bone]
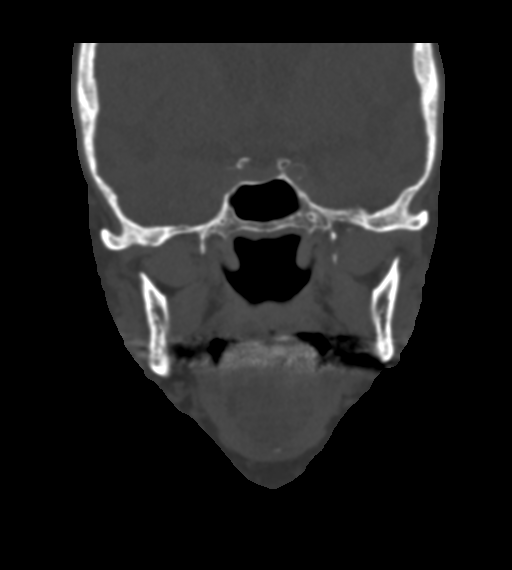
[im 46/83  bone]
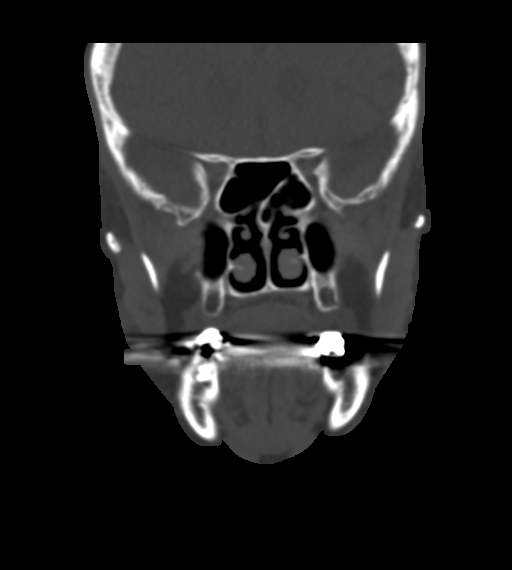

[Series 7: sagittal soft · sagittal · 0.38mm/px · 3 of 82 slices shown]
[im 28/82  bone]
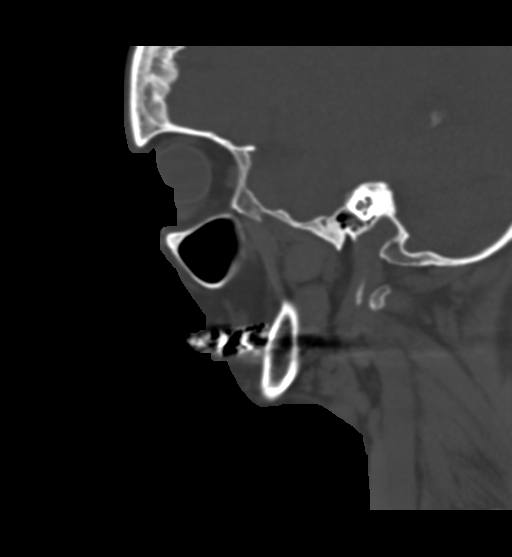
[im 41/82  bone]
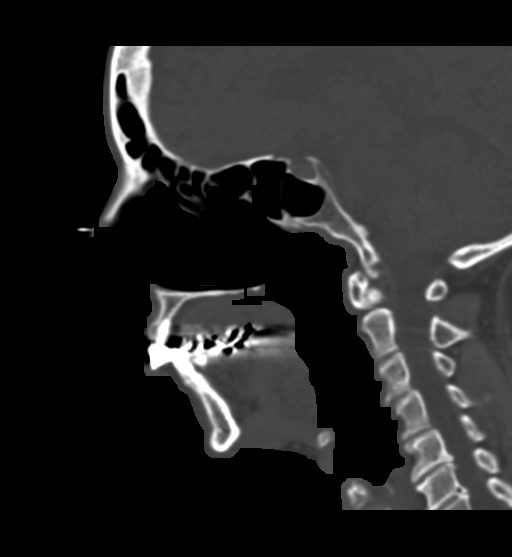
[im 55/82  bone]
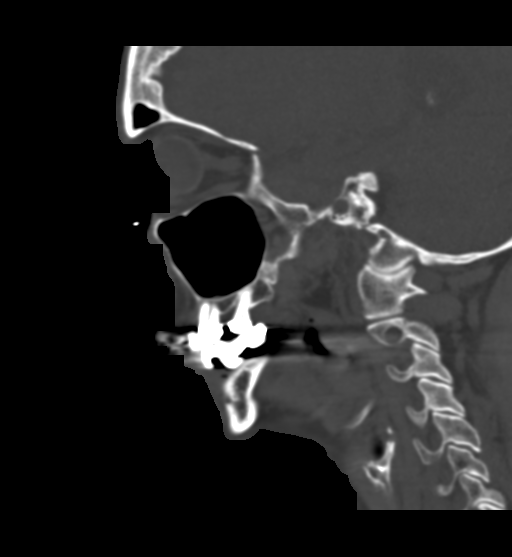

[16 of 47 positions shown; findings below may reference images not displayed]

FINDINGS: Osseous: No fracture or mandibular dislocation. No destructive
process.

Orbits: Negative. No traumatic or inflammatory finding.

Sinuses: Clear.

Soft tissues: Small laceration overlying the left zygomatic arch
anteriorly. Remaining soft tissues are unremarkable.

Limited intracranial: No significant or unexpected finding.
IMPRESSION: 1. Small left-sided soft tissue laceration overlying the zygomatic
arch.
2. No acute displaced fracture.

## 2022-01-04 ENCOUNTER — Other Ambulatory Visit: Payer: Self-pay | Admitting: Adult Health

## 2022-02-01 ENCOUNTER — Ambulatory Visit
Admission: RE | Admit: 2022-02-01 | Discharge: 2022-02-01 | Disposition: A | Discharge status: Home / Self Care | Payer: Medicare HMO | Source: Ambulatory Visit | Attending: Family Medicine | Admitting: Family Medicine

## 2022-02-01 DIAGNOSIS — M85852 Other specified disorders of bone density and structure, left thigh: Secondary | ICD-10-CM | POA: Diagnosis not present

## 2022-02-01 DIAGNOSIS — Z1231 Encounter for screening mammogram for malignant neoplasm of breast: Secondary | ICD-10-CM

## 2022-02-01 DIAGNOSIS — Z780 Asymptomatic menopausal state: Secondary | ICD-10-CM | POA: Diagnosis not present

## 2022-02-01 DIAGNOSIS — M810 Age-related osteoporosis without current pathological fracture: Secondary | ICD-10-CM | POA: Diagnosis not present

## 2022-03-30 ENCOUNTER — Encounter: Payer: Self-pay | Admitting: Adult Health

## 2022-03-30 ENCOUNTER — Ambulatory Visit: Payer: Medicare HMO | Admitting: Adult Health

## 2022-03-30 VITALS — BP 104/73 | HR 74 | Ht 63.0 in | Wt 114.0 lb

## 2022-03-30 DIAGNOSIS — F03B0 Unspecified dementia, moderate, without behavioral disturbance, psychotic disturbance, mood disturbance, and anxiety: Secondary | ICD-10-CM | POA: Diagnosis not present

## 2022-03-30 NOTE — Progress Notes (Signed)
PATIENT: Jessica Whitney DOB: 1945/11/30  REASON FOR VISIT: follow up HISTORY FROM: patient Chief Complaint  Patient presents with   RM 9    Here with her husband and son. Pt states she is doing well. Husband states things are worse. She sleeps most of the day and is awake most of the night. He states her memory lasts maybe 5 seconds. Sometimes she forgets who her husband is. MMSE 19/30 Animals 0.     HISTORY OF PRESENT ILLNESS: Today 03/30/22:  Jessica Whitney is a 76 year old female with a history of memory disturbance.  She returns today for follow-up.  She is here today with her husband and son.  Her husband feels that her memory is much worse.  The patient feels that her memory is stable.  Husband states that she will forget something within 5 seconds.  He states that she is sleeping most of the day and awake during the night.During the night she thinks its time to pack and leave. She will start packing things up and put it on the porch. Able to complete all ADLs independently. No longer driving. Still  taking Namenda XR daily. Husband tuses their cat to distract her to keep her from packing clothes up. Son will also come get her and take her out for a while. She doesn't cook but does reheat food on the stove. Husband manages medications, appts and finances.   09/22/21: Jessica Whitney is a 76 year old female with a history of memory disturbance.  She returns today for follow-up.  She is here today with her husband.  Her son was not able to make this appointment.  Reports that she feels that her memory is stable. Husband reports that he helps with her medications, appointments. Husband reports that she needs eye drops 6 times a day so he has to help her with that. Sometimes refuses nighttime medication. Does not drive- husband has to tell her that her car is in the shop in order to keep her from obsessing over driving. Some agitation noted. Sometimes doesn't recognize her home.  Continues to tolerate  Namenda XR well.  Husband states that has been helpful to have the extended release form  03/24/21: Jessica Whitney is a 76 year old female with a history of dementia.  She returns today for follow-up.  She is here today with her husband and son.  The husband reports that typically in the afternoons she will come more confused.  She states that she is in the wrong home and wants to pack up her belongings.  He states by the next morning this has resolved and she unpacked her belongings.  She is able to complete all ADLs independently.  She does not operate a motor vehicle.  He helps manage her medications and appointments.  He has not looked into extra help in the home.  He does report that he sometimes has trouble getting her to take her nighttime medications. she returns today for an evaluation.  09/16/20: Jessica Whitney is a 76 year old female with a history of dementia.  She returns today for follow-up.  She is here today with her husband.  Husband reports that she continues to have trouble with her short-term memory.  Reports that they went to a birthday party and she did not remember it 2 hours later.  She also sometimes forgets that she is married to him.  She is able to complete all ADLs independently.  Denies any trouble sleeping.  Mood and behavior has remained  relatively stable.  She is currently on Namenda 10 mg twice a day.  Husband reports that he is having a hard time getting her to take her nighttime medication.  06/03/20:Jessica Whitney is  a 76 year old female with a history of dementia.  She returns today for follow-up.  She is here today with her husband and son.  They both feel that her memory has gotten worse.  The patient feels that her memory is stable.  They report that she continues to be repetitive.  They have a hard time getting her to go to places.  Reports that they had a hard time getting her car to come to this appointment today.  At home patient is able to complete all ADLs independently.  She  does not operate a motor vehicle.  She does not do any cooking.  She remains on Namenda 10 mg twice a day.  No hallucinations or delusions.  No significant change in her mood or behavior.   03/16/20:Jessica Whitney is a 76 year old female with a history of dementia.  She returns today for follow-up.  Her husband is with her today.  He feels that her memory has gotten worse.  He states that there is been a gradual decline over the last several months.  She is still able to complete all ADLs independently.  She does not operate a motor vehicle.  He states that she tends to fall asleep on the couch and sleeps in late the next day.  Patient reports good appetite.  Denies any change in mood or behavior.  No hallucinations or delusions.  Has reports that she can be repetitive at times.  She is currently taking Namenda 5 mg twice a day   HISTORY 12/02/19:   Jessica Whitney is a 76 year old female with a history of dementia.  She returns today for follow-up.  She is here today with her husband and her son.  They feel that over the last year her memory has gotten worse.  She lives at home with her husband.  She is able to complete all ADLs independently.  She no longer operates a motor vehicle.  She no longer cooks but the husband does not feel that this its due to her memory.  He reports that at bedtime sometimes she forgets that she is married and would ask him to sleep in another room.  Her son also notes that sometimes she may not remember things about her grandchildren.  She was on Aricept but was taken off of this medication due to weight loss.  Although the family reports that she had acute pancreatitis and that is what contributed to the weight loss she returns today for an evaluation.  REVIEW OF SYSTEMS: Out of a complete 14 system review of symptoms, the patient complains only of the following symptoms, and all other reviewed systems are negative.  See HPI  ALLERGIES: Allergies  Allergen Reactions   Sulfa  Antibiotics Other (See Comments)    Caused mouth sores    HOME MEDICATIONS: Outpatient Medications Prior to Visit  Medication Sig Dispense Refill   DULoxetine HCl 40 MG CPEP Take by mouth. 2 tablets in the morning and 1 tablet at night     famotidine (PEPCID) 40 MG tablet Take 40 mg by mouth daily.     levothyroxine (SYNTHROID) 75 MCG tablet Take 75 mcg by mouth daily.     memantine (NAMENDA XR) 28 MG CP24 24 hr capsule TAKE 1 CAPSULE (28 MG TOTAL) BY MOUTH DAILY. 90  capsule 3   UNABLE TO FIND Med Name: IVIZIA eye drops     valACYclovir (VALTREX) 500 MG tablet Take 500 mg by mouth daily.     vitamin B-12 (CYANOCOBALAMIN) 1000 MCG tablet Take 1,000 mcg by mouth daily.     levothyroxine (SYNTHROID) 100 MCG tablet Take 100 mcg by mouth daily before breakfast.     lidocaine (LIDODERM) 5 % Place 1 patch onto the skin daily. Remove & Discard patch within 12 hours or as directed by MD. Lower back (Patient not taking: Reported on 03/30/2022) 30 patch 0   OXERVATE 0.002 % SOLN Apply 0.002 drops to eye 6 (six) times daily. (Patient not taking: Reported on 03/30/2022)     Vitamin D, Ergocalciferol, (DRISDOL) 1.25 MG (50000 UNIT) CAPS capsule Take 50,000 Units by mouth once a week. (Patient not taking: Reported on 03/30/2022)     No facility-administered medications prior to visit.    PAST MEDICAL HISTORY: Past Medical History:  Diagnosis Date   Acute pancreatitis 08/30/2019   Arthritis    Diverticulitis    Fibromyalgia    GERD (gastroesophageal reflux disease)    bucchini   Hypokalemia 08/30/2019   Hypothyroidism 08/30/2019   Major depressive disorder in remission 08/30/2019   Major neurocognitive disorder due to Alzheimer's disease 09/22/2019   PONV (postoperative nausea and vomiting)    Spinal stenosis    Vitamin B12 deficiency    Vitamin D deficiency     PAST SURGICAL HISTORY: Past Surgical History:  Procedure Laterality Date   APPENDECTOMY     BACK SURGERY     bone spur shoulder Bilateral     BREAST EXCISIONAL BIOPSY Right    BREAST SURGERY Right    lumpectomy non cancerous   CHOLECYSTECTOMY     COLON SURGERY     foot of colon removed - diverticulitis   EYE SURGERY Left    cross-eye fixed   JOINT REPLACEMENT Right    knee   knee arthroscopic Right    LUMBAR LAMINECTOMY/DECOMPRESSION MICRODISCECTOMY N/A 04/03/2013   Procedure: Lumbar Four-Five Lumbar diskectomy with Coflex;  Surgeon: Faythe Ghee, MD;  Location: [facility];  Service: Neurosurgery;  Laterality: N/A;  L4-5 Lumbar diskectomy with Coflex   TRIGGER FINGER RELEASE Right    x2   TUBAL LIGATION      FAMILY HISTORY: Family History  Problem Relation Age of Onset   Memory loss Neg Hx    Alzheimer's disease Neg Hx     SOCIAL HISTORY: Social History   Socioeconomic History   Marital status: Married    Spouse name: Not on file   Number of children: Not on file   Years of education: 13   Highest education level: Some college, no degree  Occupational History   Occupation: Retired    Comment: Consulting civil engineer for 20 years  Tobacco Use   Smoking status: Former   Smokeless tobacco: Never  Scientific laboratory technician Use: Never used  Substance and Sexual Activity   Alcohol use: Not Currently    Alcohol/week: 0.0 standard drinks of alcohol    Comment: very rare glass of wine   Drug use: No   Sexual activity: Not on file  Other Topics Concern   Not on file  Social History Narrative   Caffeine: tea 2-3 large cups/day    Social Determinants of Health   Financial Resource Strain: Not on file  Food Insecurity: Not on file  Transportation Needs: Not on file  Physical Activity: Not  on file  Stress: Not on file  Social Connections: Not on file  Intimate Partner Violence: Not on file      PHYSICAL EXAM  Vitals:   03/30/22 1326  BP: 104/73  Pulse: 74  Weight: 114 lb (51.7 kg)  Height: 5\' 3"  (1.6 m)    Body mass index is 20.19 kg/m.      03/30/2022    1:29 PM 09/22/2021    1:11 PM 03/24/2021     1:22 PM  MMSE - Mini Mental State Exam  Orientation to time 0 1 2  Orientation to Place 5 5 4   Registration 3 3 3   Attention/ Calculation 4 0 0  Recall 0 0 0  Language- name 2 objects 2 2 2   Language- repeat 0 1 0  Language- follow 3 step command 3 3 3   Language- read & follow direction 1 0 1  Write a sentence 1 0 1  Copy design 0 1 1  Total score 19 16 17    will 22-year-old disclosure they are comfortable  Generalized: Well developed, in no acute distress   Neurological examination  Mentation: Alert oriented to time, place, history taking. Follows all commands speech and language fluent Cranial nerve II-XII: Pupils were equal round reactive to light. Extraocular movements were full, visual field were full on confrontational test. Facial sensation and strength were normal. Head turning and shoulder shrug  were normal and symmetric. Motor: The motor testing reveals 5 over 5 strength of all 4 extremities. Good symmetric motor tone is noted throughout.  Sensory: Sensory testing is intact to soft touch on all 4 extremities. No evidence of extinction is noted.  Coordination: Cerebellar testing reveals good finger-nose-finger and heel-to-shin bilaterally.  Gait and station: Gait is normal.    DIAGNOSTIC DATA (LABS, IMAGING, TESTING) - I reviewed patient records, labs, notes, testing and imaging myself where available.  Lab Results  Component Value Date   WBC 17.8 (H) 09/06/2019   HGB 11.2 (L) 09/06/2019   HCT 34.1 (L) 09/06/2019   MCV 88.6 09/06/2019   PLT 242 09/06/2019      Component Value Date/Time   NA 134 (L) 09/06/2019 0444   K 4.2 09/06/2019 0444   CL 103 09/06/2019 0444   CO2 23 09/06/2019 0444   GLUCOSE 109 (H) 09/06/2019 0444   BUN 6 (L) 09/06/2019 0444   CREATININE 0.71 09/06/2019 0444   CALCIUM 7.4 (L) 09/06/2019 0444   PROT 4.9 (L) 09/05/2019 0517   ALBUMIN 2.3 (L) 09/05/2019 0517   AST 48 (H) 09/05/2019 0517   ALT 36 09/05/2019 0517   ALKPHOS 179 (H)  09/05/2019 0517   BILITOT 0.9 09/05/2019 0517   GFRNONAA >60 09/06/2019 0444   GFRAA >60 09/06/2019 0444    Lab Results  Component Value Date   TSH 2.229 09/01/2019      ASSESSMENT AND PLAN 76 y.o. year old female  has a past medical history of Acute pancreatitis (08/30/2019), Arthritis, Diverticulitis, Fibromyalgia, GERD (gastroesophageal reflux disease), Hypokalemia (08/30/2019), Hypothyroidism (08/30/2019), Major depressive disorder in remission (08/30/2019), Major neurocognitive disorder due to Alzheimer's disease (09/22/2019), PONV (postoperative nausea and vomiting), Spinal stenosis, Vitamin B12 deficiency, and Vitamin D deficiency. here with :  1.  Dementia  Memory score MMSE 19/30 previously 16/30--stable Continue  Namenda XR 28 mg daily Discussed day program with son and husband. Highly recommend.  FU 6 months or sooner if needed     10/30/2019, MSN, NP-C 03/30/2022, 1:45 PM [facility] [address] [place], [place] [address] [phone]

## 2022-03-30 NOTE — Patient Instructions (Signed)
Your Plan:  Continue Namenda XR 28 mg If your symptoms worsen or you develop new symptoms please let us know.    Thank you for coming to see [place] at [facility]. I hope we have been able to provide you high quality care today.  You may receive a patient satisfaction survey over the next few weeks. We would appreciate your feedback and comments so that we may continue to improve ourselves and the health of our patients.

## 2022-04-05 DIAGNOSIS — M810 Age-related osteoporosis without current pathological fracture: Secondary | ICD-10-CM | POA: Diagnosis not present

## 2022-04-17 DIAGNOSIS — H18413 Arcus senilis, bilateral: Secondary | ICD-10-CM | POA: Diagnosis not present

## 2022-04-17 DIAGNOSIS — B0052 Herpesviral keratitis: Secondary | ICD-10-CM | POA: Diagnosis not present

## 2022-04-17 DIAGNOSIS — H43813 Vitreous degeneration, bilateral: Secondary | ICD-10-CM | POA: Diagnosis not present

## 2022-04-17 DIAGNOSIS — H16233 Neurotrophic keratoconjunctivitis, bilateral: Secondary | ICD-10-CM | POA: Diagnosis not present

## 2022-07-07 DIAGNOSIS — R1032 Left lower quadrant pain: Secondary | ICD-10-CM | POA: Diagnosis not present

## 2022-07-07 DIAGNOSIS — Z6820 Body mass index (BMI) 20.0-20.9, adult: Secondary | ICD-10-CM | POA: Diagnosis not present

## 2022-08-09 DIAGNOSIS — H16233 Neurotrophic keratoconjunctivitis, bilateral: Secondary | ICD-10-CM | POA: Diagnosis not present

## 2022-08-09 DIAGNOSIS — B0052 Herpesviral keratitis: Secondary | ICD-10-CM | POA: Diagnosis not present

## 2022-08-09 DIAGNOSIS — H43813 Vitreous degeneration, bilateral: Secondary | ICD-10-CM | POA: Diagnosis not present

## 2022-08-09 DIAGNOSIS — H18413 Arcus senilis, bilateral: Secondary | ICD-10-CM | POA: Diagnosis not present

## 2022-08-22 DIAGNOSIS — F3341 Major depressive disorder, recurrent, in partial remission: Secondary | ICD-10-CM | POA: Diagnosis not present

## 2022-08-22 DIAGNOSIS — R636 Underweight: Secondary | ICD-10-CM | POA: Diagnosis not present

## 2022-08-22 DIAGNOSIS — E7800 Pure hypercholesterolemia, unspecified: Secondary | ICD-10-CM | POA: Diagnosis not present

## 2022-08-22 DIAGNOSIS — Z23 Encounter for immunization: Secondary | ICD-10-CM | POA: Diagnosis not present

## 2022-08-22 DIAGNOSIS — E039 Hypothyroidism, unspecified: Secondary | ICD-10-CM | POA: Diagnosis not present

## 2022-08-22 DIAGNOSIS — E559 Vitamin D deficiency, unspecified: Secondary | ICD-10-CM | POA: Diagnosis not present

## 2022-08-22 DIAGNOSIS — N1831 Chronic kidney disease, stage 3a: Secondary | ICD-10-CM | POA: Diagnosis not present

## 2022-08-22 DIAGNOSIS — F03B0 Unspecified dementia, moderate, without behavioral disturbance, psychotic disturbance, mood disturbance, and anxiety: Secondary | ICD-10-CM | POA: Diagnosis not present

## 2022-08-22 DIAGNOSIS — Z0000 Encounter for general adult medical examination without abnormal findings: Secondary | ICD-10-CM | POA: Diagnosis not present

## 2022-09-20 DIAGNOSIS — E039 Hypothyroidism, unspecified: Secondary | ICD-10-CM | POA: Diagnosis not present

## 2022-09-20 DIAGNOSIS — E7800 Pure hypercholesterolemia, unspecified: Secondary | ICD-10-CM | POA: Diagnosis not present

## 2022-09-20 DIAGNOSIS — N1831 Chronic kidney disease, stage 3a: Secondary | ICD-10-CM | POA: Diagnosis not present

## 2022-09-20 DIAGNOSIS — Z713 Dietary counseling and surveillance: Secondary | ICD-10-CM | POA: Diagnosis not present

## 2022-09-20 DIAGNOSIS — E559 Vitamin D deficiency, unspecified: Secondary | ICD-10-CM | POA: Diagnosis not present

## 2022-09-25 DIAGNOSIS — F3341 Major depressive disorder, recurrent, in partial remission: Secondary | ICD-10-CM | POA: Diagnosis not present

## 2022-09-25 DIAGNOSIS — E039 Hypothyroidism, unspecified: Secondary | ICD-10-CM | POA: Diagnosis not present

## 2022-09-25 DIAGNOSIS — M810 Age-related osteoporosis without current pathological fracture: Secondary | ICD-10-CM | POA: Diagnosis not present

## 2022-09-25 DIAGNOSIS — N1831 Chronic kidney disease, stage 3a: Secondary | ICD-10-CM | POA: Diagnosis not present

## 2022-09-25 DIAGNOSIS — E7800 Pure hypercholesterolemia, unspecified: Secondary | ICD-10-CM | POA: Diagnosis not present

## 2022-09-25 DIAGNOSIS — K219 Gastro-esophageal reflux disease without esophagitis: Secondary | ICD-10-CM | POA: Diagnosis not present

## 2022-09-26 DIAGNOSIS — M79672 Pain in left foot: Secondary | ICD-10-CM | POA: Diagnosis not present

## 2022-09-26 DIAGNOSIS — I739 Peripheral vascular disease, unspecified: Secondary | ICD-10-CM | POA: Diagnosis not present

## 2022-09-26 DIAGNOSIS — L603 Nail dystrophy: Secondary | ICD-10-CM | POA: Diagnosis not present

## 2022-09-26 DIAGNOSIS — M79671 Pain in right foot: Secondary | ICD-10-CM | POA: Diagnosis not present

## 2022-10-12 DIAGNOSIS — M810 Age-related osteoporosis without current pathological fracture: Secondary | ICD-10-CM | POA: Diagnosis not present

## 2022-10-12 DIAGNOSIS — M818 Other osteoporosis without current pathological fracture: Secondary | ICD-10-CM | POA: Diagnosis not present

## 2022-10-19 ENCOUNTER — Encounter: Payer: Self-pay | Admitting: Adult Health

## 2022-10-19 ENCOUNTER — Ambulatory Visit: Payer: Medicare HMO | Admitting: Adult Health

## 2022-10-19 VITALS — BP 99/60 | HR 73 | Ht 63.0 in | Wt 105.0 lb

## 2022-10-19 DIAGNOSIS — F03B0 Unspecified dementia, moderate, without behavioral disturbance, psychotic disturbance, mood disturbance, and anxiety: Secondary | ICD-10-CM | POA: Diagnosis not present

## 2022-10-19 NOTE — Progress Notes (Signed)
PATIENT: Jessica Whitney DOB: April 27, 1946  REASON FOR VISIT: follow up HISTORY FROM: patient Chief Complaint  Patient presents with   RM 4    Here with husband for 6 month follow-up. Her husband states the patient has been up the last 4 night packing up the house. Her appetite is ok. She dresses herself and works in the yard. MMSE 12/30 Animals x 3.     HISTORY OF PRESENT ILLNESS: Today 10/19/22:  Jessica Whitney is a 77 y.o. female with a history of memory disturbance. Returns today for follow-up.  She is here today with her husband.  He states that at least for the last 4 nights she has been up most the night packing her belongings thinking that she needs to leave.  She then goes to sleep early morning hours and sleeps most of the day.  She remains on Namenda XR daily.  She does most things for herself however husband states that she does not bathe regularly.  Husband continues to manage all medications appointments and finances     03/30/22: Ms. Jessica Whitney is a 77 year old female with a history of memory disturbance.  She returns today for follow-up.  She is here today with her husband and son.  Her husband feels that her memory is much worse.  The patient feels that her memory is stable.  Husband states that she will forget something within 5 seconds.  He states that she is sleeping most of the day and awake during the night.During the night she thinks its time to pack and leave. She will start packing things up and put it on the porch. Able to complete all ADLs independently. No longer driving. Still  taking Namenda XR daily. Husband tuses their cat to distract her to keep her from packing clothes up. Son will also come get her and take her out for a while. She doesn't cook but does reheat food on the stove. Husband manages medications, appts and finances.   09/22/21: Ms. Jessica Whitney is a 77 year old female with a history of memory disturbance.  She returns today for follow-up.  She is here today  with her husband.  Her son was not able to make this appointment.  Reports that she feels that her memory is stable. Husband reports that he helps with her medications, appointments. Husband reports that she needs eye drops 6 times a day so he has to help her with that. Sometimes refuses nighttime medication. Does not drive- husband has to tell her that her car is in the shop in order to keep her from obsessing over driving. Some agitation noted. Sometimes doesn't recognize her home.  Continues to tolerate Namenda XR well.  Husband states that has been helpful to have the extended release form  03/24/21: [name] is a 77 year old female with a history of dementia.  She returns today for follow-up.  She is here today with her husband and son.  The husband reports that typically in the afternoons she will come more confused.  She states that she is in the wrong home and wants to pack up her belongings.  He states by the next morning this has resolved and she unpacked her belongings.  She is able to complete all ADLs independently.  She does not operate a motor vehicle.  He helps manage her medications and appointments.  He has not looked into extra help in the home.  He does report that he sometimes has trouble getting her to take her  nighttime medications. she returns today for an evaluation.  09/16/20: Ms. Jessica Whitney is a 77 year old female with a history of dementia.  She returns today for follow-up.  She is here today with her husband.  Husband reports that she continues to have trouble with her short-term memory.  Reports that they went to a birthday party and she did not remember it 2 hours later.  She also sometimes forgets that she is married to him.  She is able to complete all ADLs independently.  Denies any trouble sleeping.  Mood and behavior has remained relatively stable.  She is currently on Namenda 10 mg twice a day.  Husband reports that he is having a hard time getting her to take her nighttime  medication.  06/03/20:Ms. Jessica Whitney is  a 77 year old female with a history of dementia.  She returns today for follow-up.  She is here today with her husband and son.  They both feel that her memory has gotten worse.  The patient feels that her memory is stable.  They report that she continues to be repetitive.  They have a hard time getting her to go to places.  Reports that they had a hard time getting her car to come to this appointment today.  At home patient is able to complete all ADLs independently.  She does not operate a motor vehicle.  She does not do any cooking.  She remains on Namenda 10 mg twice a day.  No hallucinations or delusions.  No significant change in her mood or behavior.   03/16/20:Ms. Jessica Whitney is a 77 year old female with a history of dementia.  She returns today for follow-up.  Her husband is with her today.  He feels that her memory has gotten worse.  He states that there is been a gradual decline over the last several months.  She is still able to complete all ADLs independently.  She does not operate a motor vehicle.  He states that she tends to fall asleep on the couch and sleeps in late the next day.  Patient reports good appetite.  Denies any change in mood or behavior.  No hallucinations or delusions.  Has reports that she can be repetitive at times.  She is currently taking Namenda 5 mg twice a day   HISTORY 12/02/19:   Ms. Jessica Whitney is a 77 year old female with a history of dementia.  She returns today for follow-up.  She is here today with her husband and her son.  They feel that over the last year her memory has gotten worse.  She lives at home with her husband.  She is able to complete all ADLs independently.  She no longer operates a motor vehicle.  She no longer cooks but the husband does not feel that this its due to her memory.  He reports that at bedtime sometimes she forgets that she is married and would ask him to sleep in another room.  Her son also notes that  sometimes she Jessica not remember things about her grandchildren.  She was on Aricept but was taken off of this medication due to weight loss.  Although the family reports that she had acute pancreatitis and that is what contributed to the weight loss she returns today for an evaluation.  REVIEW OF SYSTEMS: Out of a complete 14 system review of symptoms, the patient complains only of the following symptoms, and all other reviewed systems are negative.  See HPI  ALLERGIES: Allergies  Allergen Reactions   Sulfa Antibiotics  Other (See Comments)    Caused mouth sores    HOME MEDICATIONS: Outpatient Medications Prior to Visit  Medication Sig Dispense Refill   DULoxetine HCl 40 MG CPEP Take by mouth. 2 tablets in the morning and 1 tablet at night     famotidine (PEPCID) 40 MG tablet Take 40 mg by mouth daily.     levothyroxine (SYNTHROID) 75 MCG tablet Take 75 mcg by mouth daily.     memantine (NAMENDA XR) 28 MG CP24 24 hr capsule TAKE 1 CAPSULE (28 MG TOTAL) BY MOUTH DAILY. 90 capsule 3   UNABLE TO FIND Med Name: IVIZIA eye drops     valACYclovir (VALTREX) 500 MG tablet Take 500 mg by mouth daily.     vitamin B-12 (CYANOCOBALAMIN) 1000 MCG tablet Take 1,000 mcg by mouth daily.     Vitamin D, Ergocalciferol, (DRISDOL) 1.25 MG (50000 UNIT) CAPS capsule Take 50,000 Units by mouth once a week.     lidocaine (LIDODERM) 5 % Place 1 patch onto the skin daily. Remove & Discard patch within 12 hours or as directed by MD. Lower back (Patient not taking: Reported on 10/19/2022) 30 patch 0   OXERVATE 0.002 % SOLN Apply 0.002 drops to eye 6 (six) times daily. (Patient not taking: Reported on 03/30/2022)     No facility-administered medications prior to visit.    PAST MEDICAL HISTORY: Past Medical History:  Diagnosis Date   Acute pancreatitis 08/30/2019   Arthritis    Diverticulitis    Fibromyalgia    GERD (gastroesophageal reflux disease)    bucchini   Hypokalemia 08/30/2019   Hypothyroidism 08/30/2019    Major depressive disorder in remission 08/30/2019   Major neurocognitive disorder due to Alzheimer's disease 09/22/2019   PONV (postoperative nausea and vomiting)    Spinal stenosis    Vitamin B12 deficiency    Vitamin D deficiency     PAST SURGICAL HISTORY: Past Surgical History:  Procedure Laterality Date   APPENDECTOMY     BACK SURGERY     bone spur shoulder Bilateral    BREAST EXCISIONAL BIOPSY Right    BREAST SURGERY Right    lumpectomy non cancerous   CHOLECYSTECTOMY     COLON SURGERY     foot of colon removed - diverticulitis   EYE SURGERY Left    cross-eye fixed   JOINT REPLACEMENT Right    knee   knee arthroscopic Right    LUMBAR LAMINECTOMY/DECOMPRESSION MICRODISCECTOMY N/A 04/03/2013   Procedure: Lumbar Four-Five Lumbar diskectomy with Coflex;  Surgeon: Reinaldo Meeker, MD;  Location: [facility];  Service: Neurosurgery;  Laterality: N/A;  L4-5 Lumbar diskectomy with Coflex   TRIGGER FINGER RELEASE Right    x2   TUBAL LIGATION      FAMILY HISTORY: Family History  Problem Relation Age of Onset   Memory loss Neg Hx    Alzheimer's disease Neg Hx     SOCIAL HISTORY: Social History   Socioeconomic History   Marital status: Married    Spouse name: Not on file   Number of children: Not on file   Years of education: 13   Highest education level: Some college, no degree  Occupational History   Occupation: Retired    Comment: Geophysicist/field seismologist for 20 years  Tobacco Use   Smoking status: Former   Smokeless tobacco: Never  Building services engineer Use: Never used  Substance and Sexual Activity   Alcohol use: Not Currently    Alcohol/week: 0.0 standard drinks  of alcohol    Comment: very rare glass of wine   Drug use: No   Sexual activity: Not on file  Other Topics Concern   Not on file  Social History Narrative   Caffeine: tea 2-3 large cups/day    Social Determinants of Health   Financial Resource Strain: Not on file  Food Insecurity: Not on file   Transportation Needs: Not on file  Physical Activity: Not on file  Stress: Not on file  Social Connections: Not on file  Intimate Partner Violence: Not on file      PHYSICAL EXAM  Vitals:   10/19/22 1356  BP: 99/60  Pulse: 73  Weight: 105 lb (47.6 kg)  Height:  (1.6 m)    Body mass index is 18.6 kg/m.      10/19/2022    1:59 PM 03/30/2022    1:29 PM 09/22/2021    1:11 PM  MMSE - Mini Mental State Exam  Orientation to time 0 0 1  Orientation to Place Registration Attention/ Calculation 1 4 0  Recall 0 0 0  Language- name 2 objects Language- repeat 0 0 1  Language- follow 3 step command Language- read & follow direction 1 1 0  Write a sentence 1 1 0  Copy design 0 0 1  Total score Generalized: Well developed, in no acute distress   Neurological examination  Mentation: Alert oriented to time, place, history taking. Follows all commands speech and language fluent Cranial nerve II-XII: Pupils were equal round reactive to light. Extraocular movements were full, visual field were full on confrontational test. Facial sensation and strength were normal. Head turning and shoulder shrug  were normal and symmetric. Motor: The motor testing reveals 5 over 5 strength of all 4 extremities. Good symmetric motor tone is noted throughout.  Sensory: Sensory testing is intact to soft touch on all 4 extremities. No evidence of extinction is noted.  Coordination: Cerebellar testing reveals good finger-nose-finger and heel-to-shin bilaterally.  Gait and station: Gait is normal.    DIAGNOSTIC DATA (LABS, IMAGING, TESTING) - I reviewed patient records, labs, notes, testing and imaging myself where available.  Lab Results  Component Value Date   WBC 17.8 (H) 09/06/2019   HGB 11.2 (L) 09/06/2019   HCT 34.1 (L) 09/06/2019   MCV 88.6 09/06/2019   PLT 242 09/06/2019      Component Value Date/Time   NA 134 (L) 09/06/2019 0444   K 4.2  09/06/2019 0444   CL 103 09/06/2019 0444   CO2 23 09/06/2019 0444   GLUCOSE 109 (H) 09/06/2019 0444   BUN 6 (L) 09/06/2019 0444   CREATININE 0.71 09/06/2019 0444   CALCIUM 7.4 (L) 09/06/2019 0444   PROT 4.9 (L) 09/05/2019 0517   ALBUMIN 2.3 (L) 09/05/2019 0517   AST 48 (H) 09/05/2019 0517   ALT 36 09/05/2019 0517   ALKPHOS 179 (H) 09/05/2019 0517   BILITOT 0.9 09/05/2019 0517   GFRNONAA >60 09/06/2019 0444   GFRAA >60 09/06/2019 0444    Lab Results  Component Value Date   TSH 2.229 09/01/2019      ASSESSMENT AND PLAN 77 y.o. year old female  has a past medical history of Acute pancreatitis (08/30/2019), Arthritis, Diverticulitis, Fibromyalgia, GERD (gastroesophageal reflux disease), Hypokalemia (08/30/2019), Hypothyroidism (08/30/2019), Major depressive disorder in remission (08/30/2019), Major neurocognitive disorder due to Alzheimer's disease (09/22/2019),  PONV (postoperative nausea and vomiting), Spinal stenosis, Vitamin B12 deficiency, and Vitamin D deficiency. here with :  1.  Dementia  Memory score MMSE 12/30 previously 19/30 Continue  Namenda XR 28 mg daily Recommended they try melatonin 1 to 3 mg approximately 2 hours before bedtime.  If this is not helpful we can consider trazodone in the future FU 6 months or sooner if needed     Butch Penny, MSN, NP-C 10/19/2022, 2:21 PM [facility] [address] [place], [place] [address] [phone]

## 2022-10-19 NOTE — Patient Instructions (Signed)
Your Plan:  Continue Namenda XR 28 mg  Try Melatonin 1-3 mg about 2 hours before you want to go to bed If this is not helpful then we can try trazodone If your symptoms worsen or you develop new symptoms please let us know.    Thank you for coming to see [place] at [facility]. I hope we have been able to provide you high quality care today.  You may receive a patient satisfaction survey over the next few weeks. We would appreciate your feedback and comments so that we may continue to improve ourselves and the health of our patients.

## 2022-11-21 DIAGNOSIS — H18453 Nodular corneal degeneration, bilateral: Secondary | ICD-10-CM | POA: Diagnosis not present

## 2022-11-21 DIAGNOSIS — H16223 Keratoconjunctivitis sicca, not specified as Sjogren's, bilateral: Secondary | ICD-10-CM | POA: Diagnosis not present

## 2022-12-04 ENCOUNTER — Encounter: Payer: Self-pay | Admitting: Adult Health

## 2022-12-04 NOTE — Telephone Encounter (Signed)
I called the patient's son.  Expressed my condolences for the loss of his father.  They were able to get his mother in to see his primary care tomorrow to fill out [place] 2 forms.  Did advise that if they needed our assistance or documentation of her diagnosis to please let us know that would be happy to assist.

## 2022-12-05 DIAGNOSIS — E039 Hypothyroidism, unspecified: Secondary | ICD-10-CM | POA: Diagnosis not present

## 2022-12-05 DIAGNOSIS — N1831 Chronic kidney disease, stage 3a: Secondary | ICD-10-CM | POA: Diagnosis not present

## 2022-12-05 DIAGNOSIS — F0390 Unspecified dementia without behavioral disturbance: Secondary | ICD-10-CM | POA: Diagnosis not present

## 2022-12-05 DIAGNOSIS — E559 Vitamin D deficiency, unspecified: Secondary | ICD-10-CM | POA: Diagnosis not present

## 2022-12-05 DIAGNOSIS — Z0289 Encounter for other administrative examinations: Secondary | ICD-10-CM | POA: Diagnosis not present

## 2022-12-05 DIAGNOSIS — Z681 Body mass index (BMI) 19 or less, adult: Secondary | ICD-10-CM | POA: Diagnosis not present

## 2022-12-13 DIAGNOSIS — Z111 Encounter for screening for respiratory tuberculosis: Secondary | ICD-10-CM | POA: Diagnosis not present

## 2022-12-15 DIAGNOSIS — Z681 Body mass index (BMI) 19 or less, adult: Secondary | ICD-10-CM | POA: Diagnosis not present

## 2022-12-15 DIAGNOSIS — F03911 Unspecified dementia, unspecified severity, with agitation: Secondary | ICD-10-CM | POA: Diagnosis not present

## 2022-12-19 ENCOUNTER — Encounter: Payer: Self-pay | Admitting: Adult Health

## 2022-12-23 ENCOUNTER — Other Ambulatory Visit: Payer: Self-pay

## 2022-12-23 ENCOUNTER — Emergency Department (HOSPITAL_COMMUNITY)
Admission: EM | Admit: 2022-12-23 | Discharge: 2022-12-25 | Disposition: A | Discharge status: Home / Self Care | Payer: Medicare HMO | Attending: Emergency Medicine | Admitting: Emergency Medicine

## 2022-12-23 ENCOUNTER — Encounter (HOSPITAL_COMMUNITY): Payer: Self-pay

## 2022-12-23 DIAGNOSIS — F32A Depression, unspecified: Secondary | ICD-10-CM | POA: Diagnosis present

## 2022-12-23 DIAGNOSIS — F02818 Dementia in other diseases classified elsewhere, unspecified severity, with other behavioral disturbance: Secondary | ICD-10-CM | POA: Diagnosis not present

## 2022-12-23 DIAGNOSIS — G309 Alzheimer's disease, unspecified: Secondary | ICD-10-CM

## 2022-12-23 DIAGNOSIS — F03918 Unspecified dementia, unspecified severity, with other behavioral disturbance: Secondary | ICD-10-CM | POA: Diagnosis not present

## 2022-12-23 DIAGNOSIS — F0280 Dementia in other diseases classified elsewhere without behavioral disturbance: Secondary | ICD-10-CM | POA: Diagnosis present

## 2022-12-23 DIAGNOSIS — F29 Unspecified psychosis not due to a substance or known physiological condition: Secondary | ICD-10-CM | POA: Diagnosis not present

## 2022-12-23 DIAGNOSIS — R9431 Abnormal electrocardiogram [ECG] [EKG]: Secondary | ICD-10-CM | POA: Diagnosis not present

## 2022-12-23 DIAGNOSIS — F03B18 Unspecified dementia, moderate, with other behavioral disturbance: Secondary | ICD-10-CM

## 2022-12-23 DIAGNOSIS — Z79899 Other long term (current) drug therapy: Secondary | ICD-10-CM | POA: Insufficient documentation

## 2022-12-23 DIAGNOSIS — R456 Violent behavior: Secondary | ICD-10-CM | POA: Diagnosis present

## 2022-12-23 DIAGNOSIS — F0390 Unspecified dementia without behavioral disturbance: Secondary | ICD-10-CM | POA: Diagnosis not present

## 2022-12-23 DIAGNOSIS — R6889 Other general symptoms and signs: Secondary | ICD-10-CM | POA: Diagnosis not present

## 2022-12-23 HISTORY — DX: Dementia in other diseases classified elsewhere, unspecified severity, without behavioral disturbance, psychotic disturbance, mood disturbance, and anxiety: F02.80

## 2022-12-23 LAB — CBC
HCT: 35.6 % — ABNORMAL LOW (ref 36.0–46.0)
Hemoglobin: 11.5 g/dL — ABNORMAL LOW (ref 12.0–15.0)
MCH: 32.3 pg (ref 26.0–34.0)
MCHC: 32.3 g/dL (ref 30.0–36.0)
MCV: 100 fL (ref 80.0–100.0)
Platelets: 232 10*3/uL (ref 150–400)
RBC: 3.56 MIL/uL — ABNORMAL LOW (ref 3.87–5.11)
RDW: 13.4 % (ref 11.5–15.5)
WBC: 5.2 10*3/uL (ref 4.0–10.5)
nRBC: 0 % (ref 0.0–0.2)

## 2022-12-23 LAB — BASIC METABOLIC PANEL
Anion gap: 7 (ref 5–15)
BUN: 14 mg/dL (ref 8–23)
CO2: 26 mmol/L (ref 22–32)
Calcium: 8.5 mg/dL — ABNORMAL LOW (ref 8.9–10.3)
Chloride: 106 mmol/L (ref 98–111)
Creatinine, Ser: 0.99 mg/dL (ref 0.44–1.00)
GFR, Estimated: 59 mL/min — ABNORMAL LOW (ref >60)
Glucose, Bld: 101 mg/dL — ABNORMAL HIGH (ref 70–99)
Potassium: 3.7 mmol/L (ref 3.5–5.1)
Sodium: 139 mmol/L (ref 135–145)

## 2022-12-23 LAB — ACETAMINOPHEN LEVEL: Acetaminophen (Tylenol), Serum: <10 ug/mL — ABNORMAL LOW (ref 10–30)

## 2022-12-23 LAB — SALICYLATE LEVEL: Salicylate Lvl: <7 mg/dL — ABNORMAL LOW (ref 7.0–30.0)

## 2022-12-23 LAB — ETHANOL: Alcohol, Ethyl (B): <10 mg/dL (ref <10)

## 2022-12-23 MED ORDER — QUETIAPINE FUMARATE 50 MG PO TABS
50.0000 mg | ORAL_TABLET | Freq: Once | ORAL | Status: AC
  Administered 2022-12-23: 50 mg via ORAL
  Filled 2022-12-23: qty 1

## 2022-12-23 NOTE — ED Triage Notes (Signed)
EMS report patient pulled the fire alarm at the facility. They want the patient to have Psych eval due to possible suicidal ideation. Hx

## 2022-12-23 NOTE — BH Assessment (Signed)
TTS attempted to complete TTS assessment. Per Junior, RN, patient is asleep. Patient will be assessed at later time.

## 2022-12-23 NOTE — ED Provider Notes (Signed)
[place] EMERGENCY DEPARTMENT AT [facility] Provider Note   CSN: [id number] Arrival date & time: 12/23/22  1728     History {Add pertinent medical, surgical, social history, OB history to HPI:1} Chief Complaint  Patient presents with   Psychiatric Evaluation    Jessica Whitney is a 77 y.o. female.  HPI     Home Medications Prior to Admission medications   Medication Sig Start Date End Date Taking? Authorizing Provider  DULoxetine HCl 40 MG CPEP Take by mouth. 2 tablets in the morning and 1 tablet at night 05/19/19   [provider]  famotidine (PEPCID) 40 MG tablet Take 40 mg by mouth daily.    [provider]  levothyroxine (SYNTHROID) 75 MCG tablet Take 75 mcg by mouth daily. 02/22/22   [provider]  lidocaine (LIDODERM) 5 % Place 1 patch onto the skin daily. Remove & Discard patch within 12 hours or as directed by MD. Lower back Patient not taking: Reported on 10/19/2022 09/07/19   Rai, Ripudeep K, MD  memantine (NAMENDA XR) 28 MG CP24 24 hr capsule TAKE 1 CAPSULE (28 MG TOTAL) BY MOUTH DAILY. 01/04/22   Butch Penny, NP  OXERVATE 0.002 % SOLN Apply 0.002 drops to eye 6 (six) times daily. Patient not taking: Reported on 03/30/2022 02/01/21   [provider]  UNABLE TO FIND Med Name: IVIZIA eye drops    [provider]  valACYclovir (VALTREX) 500 MG tablet Take 500 mg by mouth daily. 11/12/19   [provider]  vitamin B-12 (CYANOCOBALAMIN) 1000 MCG tablet Take 1,000 mcg by mouth daily.    [provider]  Vitamin D, Ergocalciferol, (DRISDOL) 1.25 MG (50000 UNIT) CAPS capsule Take 50,000 Units by mouth once a week. 08/18/21   [provider]      Allergies    Sulfa antibiotics    Review of Systems   Review of Systems  Physical Exam Updated Vital Signs BP 117/77   Pulse 91   Temp 97.8 F (36.6 C) (Oral)   Resp 17   SpO2 99%  Physical Exam  ED Results / Procedures / Treatments    Labs (all labs ordered are listed, but only abnormal results are displayed) Labs Reviewed  CBC - Abnormal; Notable for the following components:      Result Value   RBC 3.56 (*)    Hemoglobin 11.5 (*)    HCT 35.6 (*)    All other components within normal limits  BASIC METABOLIC PANEL - Abnormal; Notable for the following components:   Glucose, Bld 101 (*)    Calcium 8.5 (*)    GFR, Estimated 59 (*)    All other components within normal limits  ACETAMINOPHEN LEVEL - Abnormal; Notable for the following components:   Acetaminophen (Tylenol), Serum <10 (*)    All other components within normal limits  SALICYLATE LEVEL - Abnormal; Notable for the following components:   Salicylate Lvl <7.0 (*)    All other components within normal limits  ETHANOL  URINALYSIS, ROUTINE W REFLEX MICROSCOPIC  RAPID URINE DRUG SCREEN, HOSP PERFORMED    EKG None  Radiology No results found.  Procedures Procedures  {Document cardiac monitor, telemetry assessment procedure when appropriate:1}  Medications Ordered in ED Medications  QUEtiapine (SEROQUEL) tablet 50 mg (50 mg Oral Given 12/23/22 2213)    ED Course/ Medical Decision Making/ A&P Clinical Course as of 12/23/22 2322  Sat Dec 23, 2022  2302 Medically cleared for TTS eval [CP]  Clinical Course User Index [CP] Secily Walthour, Harrel Carina, PA-C   {   Click here for ABCD2, HEART and other calculatorsREFRESH Note before signing :1}                          Medical Decision Making Amount and/or Complexity of Data Reviewed Labs: ordered.  Risk Prescription drug management.   ***  {Document critical care time when appropriate:1} {Document review of labs and clinical decision tools ie heart score, Chads2Vasc2 etc:1}  {Document your independent review of radiology images, and any outside records:1} {Document your discussion with family members, caretakers, and with consultants:1} {Document social determinants of health affecting pt's  care:1} {Document your decision making why or why not admission, treatments were needed:1} Final Clinical Impression(s) / ED Diagnoses Final diagnoses:  None    Rx / DC Orders ED Discharge Orders     None

## 2022-12-24 ENCOUNTER — Encounter (HOSPITAL_COMMUNITY): Payer: Self-pay

## 2022-12-24 ENCOUNTER — Other Ambulatory Visit: Payer: Self-pay

## 2022-12-24 DIAGNOSIS — F03918 Unspecified dementia, unspecified severity, with other behavioral disturbance: Secondary | ICD-10-CM | POA: Insufficient documentation

## 2022-12-24 LAB — URINALYSIS, ROUTINE W REFLEX MICROSCOPIC
Bacteria, UA: NONE SEEN
Bilirubin Urine: NEGATIVE
Glucose, UA: NEGATIVE mg/dL
Ketones, ur: NEGATIVE mg/dL
Leukocytes,Ua: NEGATIVE
Nitrite: NEGATIVE
Protein, ur: NEGATIVE mg/dL
Specific Gravity, Urine: 1.01 (ref 1.005–1.030)
pH: 6 (ref 5.0–8.0)

## 2022-12-24 LAB — RAPID URINE DRUG SCREEN, HOSP PERFORMED
Amphetamines: NOT DETECTED
Barbiturates: NOT DETECTED
Benzodiazepines: NOT DETECTED
Cocaine: NOT DETECTED
Opiates: NOT DETECTED
Tetrahydrocannabinol: NOT DETECTED

## 2022-12-24 MED ORDER — DIVALPROEX SODIUM 125 MG PO CSDR
125.0000 mg | DELAYED_RELEASE_CAPSULE | Freq: Three times a day (TID) | ORAL | Status: DC
  Administered 2022-12-24 – 2022-12-25 (×3): 125 mg via ORAL
  Filled 2022-12-24 (×3): qty 1

## 2022-12-24 MED ORDER — DULOXETINE HCL 20 MG PO CPEP
40.0000 mg | ORAL_CAPSULE | Freq: Every day | ORAL | Status: DC
  Administered 2022-12-25: 40 mg via ORAL
  Filled 2022-12-24: qty 2

## 2022-12-24 MED ORDER — QUETIAPINE FUMARATE 25 MG PO TABS
25.0000 mg | ORAL_TABLET | Freq: Every day | ORAL | Status: DC
  Administered 2022-12-24: 25 mg via ORAL
  Filled 2022-12-24: qty 1

## 2022-12-24 MED ORDER — MEMANTINE HCL ER 28 MG PO CP24
28.0000 mg | ORAL_CAPSULE | Freq: Every day | ORAL | Status: DC
  Administered 2022-12-24 – 2022-12-25 (×2): 28 mg via ORAL
  Filled 2022-12-24 (×2): qty 1

## 2022-12-24 MED ORDER — LEVOTHYROXINE SODIUM 50 MCG PO TABS
75.0000 ug | ORAL_TABLET | Freq: Every day | ORAL | Status: DC
  Administered 2022-12-24 – 2022-12-25 (×2): 75 ug via ORAL
  Filled 2022-12-24 (×2): qty 1

## 2022-12-24 NOTE — ED Provider Notes (Signed)
Emergency Medicine Observation Re-evaluation Note  Jessica Whitney is a 77 y.o. female, seen on rounds today.  Pt initially presented to the ED for complaints of Psychiatric Evaluation Currently, the patient is awake and alert.  Pt was seen in the ED yesterday for worsening aggression.  She has been sleeping all day and night and apparently tried to get out of bed and fell.  Nurse heard the noise and found her sitting on her bottom.  I went to see pt and she has no pain or visible injuries.  She was able to walk after this fall.  TTS apparently tried to evaluate her last night, but she was asleep.  Physical Exam  BP 125/76   Pulse 78   Temp 97.9 F (36.6 C) (Oral)   Resp 16   SpO2 100%  Physical Exam General: awake and alert Cardiac: rr Lungs: clear Psych: calm  ED Course / MDM  EKG:EKG Interpretation Date/Time:  Saturday December 23 2022 22:19:08 EDT Ventricular Rate:  74 PR Interval:  152 QRS Duration:  88 QT Interval:  386 QTC Calculation: 428 R Axis:   57  Text Interpretation: Normal sinus rhythm Nonspecific T wave abnormality Abnormal ECG When compared with ECG of 30-Aug-2019 08:08, PREVIOUS ECG IS PRESENT Confirmed by Alvester Chou [phone]) on 12/24/2022 10:12:39 AM  I have reviewed the labs performed to date as well as medications administered while in observation.  Recent changes in the last 24 hours include none.  Plan  Current plan is for TTS eval.  No injury from fall, but will continue to monitor in case something hurts.    Jacalyn Lefevre, MD 12/24/22 [phone]
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
## 2022-12-24 NOTE — ED Notes (Signed)
Patient to room 31.  Patient ambulated to room. Patient confused. Patient cooperative. Patient oriented to unit and room.

## 2022-12-25 ENCOUNTER — Other Ambulatory Visit (HOSPITAL_COMMUNITY): Payer: Self-pay

## 2022-12-25 DIAGNOSIS — F03918 Unspecified dementia, unspecified severity, with other behavioral disturbance: Secondary | ICD-10-CM | POA: Diagnosis not present

## 2022-12-25 MED ORDER — QUETIAPINE FUMARATE 25 MG PO TABS: 25.0000 mg | ORAL_TABLET | Freq: Every day | ORAL | 0 refills | Status: AC

## 2022-12-25 MED ORDER — DIVALPROEX SODIUM 125 MG PO CSDR: 125.0000 mg | DELAYED_RELEASE_CAPSULE | Freq: Three times a day (TID) | ORAL | 0 refills | Status: DC

## 2022-12-25 MED ORDER — QUETIAPINE FUMARATE 25 MG PO TABS: 25.0000 mg | ORAL_TABLET | Freq: Every day | ORAL | 0 refills | Status: DC

## 2022-12-25 MED ORDER — LEVOTHYROXINE SODIUM 75 MCG PO TABS: 75.0000 ug | ORAL_TABLET | Freq: Every day | ORAL | 0 refills | Status: DC

## 2022-12-25 MED ORDER — DULOXETINE HCL 40 MG PO CPEP: 20.0000 mg | ORAL_CAPSULE | Freq: Every day | ORAL | 0 refills | Status: DC

## 2022-12-25 NOTE — ED Provider Notes (Signed)
Emergency Medicine Observation Re-evaluation Note  Jessica Whitney is a 77 y.o. female, seen on rounds today.  Pt initially presented to the ED for complaints of Psychiatric Evaluation Currently, the patient is asleep.  Physical Exam  BP 120/76 (BP Location: Right Arm)   Pulse 70   Temp 97.6 F (36.4 C) (Oral)   Resp 16   SpO2 100%  Physical Exam General: Asleep, no acute distress Cardiac: Regular rate Lungs: No increased work of breathing Psych: Calm, asleep  ED Course / MDM  EKG:EKG Interpretation Date/Time:  Saturday December 23 2022 22:19:08 EDT Ventricular Rate:  74 PR Interval:  152 QRS Duration:  88 QT Interval:  386 QTC Calculation: 428 R Axis:   57  Text Interpretation: Normal sinus rhythm Nonspecific T wave abnormality Abnormal ECG When compared with ECG of 30-Aug-2019 08:08, PREVIOUS ECG IS PRESENT Confirmed by Alvester Chou [phone]) on 12/24/2022 10:12:39 AM  I have reviewed the labs performed to date as well as medications administered while in observation.  Recent changes in the last 24 hours include evaluated by psych and started on Depakote and Seroquel. Recommended overnight observation and reassessment in the morning for possible discharge back to nursing home.  Plan  Current plan is for reassessment by psych for disposition.Theresia Lo, Cecile Sheerer, DO 12/25/22 [phone]
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
## 2022-12-25 NOTE — Progress Notes (Addendum)
This CSW has faxed over FL2 to [employer] for their records.   Fax: [phone] Email: [email or web address]  Addend @ 12:01 PM Per EMT, the representative has completed their visit. This CSW spoke with pt's son, Jessica Whitney, who will follow up with [place] at [facility]. Corey's plan is to take his mother directly to [facility]   Addend @ 12:29  Patient accepted to [facility]. Son is here to pick her up to transport her there. Care team made aware. No further TOC needs.

## 2022-12-25 NOTE — Discharge Summary (Addendum)
[facility] Psych ED Discharge  12/25/2022 10:33 AM [name]  MRN:  [id number]  Principal Problem: Dementia with behavioral disturbance [facility]) Discharge Diagnoses: Principal Problem:   Dementia with behavioral disturbance [facility]) Active Problems:   Depression  Clinical Impression:  Final diagnoses:  Moderate dementia, with behavioral disturbance (HCC)   Subjective:  Jessica Whitney is a 77 y.o. female with past medical history significant for fibromyalgia, recent progression of dementia who presents for psychiatric eval after pulling the fire alarm at her mental care facility.  Patient was seen this morning awake, alert and engaged in short conversation.  Patient is oriented to her full name only.  She is aware of not being at home this morning but does not know where she is.  Nursing reports she slept well last night and needed no PRN Medications.  This morning she ate breakfast and accepted her medications.  Medication changes were made when she came in.  We added Depakote and Seroquel for mood stabilization and sleep.  Patient's Cymbalta was decreased to 20 mg daily instead of 40 mg due to age. And QTC Concerns. Provider called Sherry Ruffing son to discuss discharge and medication management.  He is in agreement with Medications patient is taking here.  He also advised to hold off sending patient back To [facility] as he is already moving patient to a different facility.  He is on his way to sign papers form discharge to [facility] care facility. Provider also spoke with [place] at [facility] care regarding patient coming back to the facility.  She states that they can only take patient if the son agrees to the Medications changes.  Provider called and informed [place] that son os moving his mother to a different facility. Patient is alert and awake and calm.  Patient denies SI/HI/AVH. Patient is Psychiatrically cleared.  TOC is in for transportation and assist with moving  patient.  ED Assessment Time Calculation: Start Time: 1006 Stop Time: 1032 Total Time in Minutes (Assessment Completion): 26   Past Psychiatric History: see initial Psychiatry evaluation   Past Medical History:  Past Medical History:  Diagnosis Date   Acute pancreatitis 08/30/2019   Alzheimer's dementia (HCC)    Arthritis    Diverticulitis    Fibromyalgia    GERD (gastroesophageal reflux disease)    bucchini   Hypokalemia 08/30/2019   Hypothyroidism 08/30/2019   Major depressive disorder in remission 08/30/2019   Major neurocognitive disorder due to Alzheimer's disease 09/22/2019   PONV (postoperative nausea and vomiting)    Spinal stenosis    Vitamin B12 deficiency    Vitamin D deficiency     Past Surgical History:  Procedure Laterality Date   APPENDECTOMY     BACK SURGERY     bone spur shoulder Bilateral    BREAST EXCISIONAL BIOPSY Right    BREAST SURGERY Right    lumpectomy non cancerous   CHOLECYSTECTOMY     COLON SURGERY     foot of colon removed - diverticulitis   EYE SURGERY Left    cross-eye fixed   JOINT REPLACEMENT Right    knee   knee arthroscopic Right    LUMBAR LAMINECTOMY/DECOMPRESSION MICRODISCECTOMY N/A 04/03/2013   Procedure: Lumbar Four-Five Lumbar diskectomy with Coflex;  Surgeon: Reinaldo Meeker, MD;  Location: [facility];  Service: Neurosurgery;  Laterality: N/A;  L4-5 Lumbar diskectomy with Coflex   TRIGGER FINGER RELEASE Right    x2   TUBAL LIGATION  Family History:  Family History  Problem Relation Age of Onset   Memory loss Neg Hx    Alzheimer's disease Neg Hx    Family Psychiatric  History: see initial Psychiatry evaluation  Social History:  Social History   Substance and Sexual Activity  Alcohol Use Not Currently   Alcohol/week: 0.0 standard drinks of alcohol   Comment: very rare glass of wine     Social History   Substance and Sexual Activity  Drug Use No    Social History   Socioeconomic History   Marital  status: Married    Spouse name: Not on file   Number of children: Not on file   Years of education: 13   Highest education level: Some college, no degree  Occupational History   Occupation: Retired    Comment: Geophysicist/field seismologist for 20 years  Tobacco Use   Smoking status: Former   Smokeless tobacco: Never  Building services engineer Use: Never used  Substance and Sexual Activity   Alcohol use: Not Currently    Alcohol/week: 0.0 standard drinks of alcohol    Comment: very rare glass of wine   Drug use: No   Sexual activity: Not on file  Other Topics Concern   Not on file  Social History Narrative   Caffeine: tea 2-3 large cups/day    Social Determinants of Health   Financial Resource Strain: Not on file  Food Insecurity: Not on file  Transportation Needs: Not on file  Physical Activity: Not on file  Stress: Not on file  Social Connections: Not on file    Tobacco Cessation:  N/A, patient does not currently use tobacco products  Current Medications: Current Facility-Administered Medications  Medication Dose Route Frequency Provider Last Rate Last Admin   divalproex (DEPAKOTE SPRINKLE) capsule 125 mg  125 mg Oral TID Motley-Mangrum, Jadeka A, PMHNP   125 mg at 12/25/22 0946   DULoxetine (CYMBALTA) DR capsule 40 mg  40 mg Oral Daily Motley-Mangrum, Jadeka A, PMHNP   40 mg at 12/25/22 0946   levothyroxine (SYNTHROID) tablet 75 mcg  75 mcg Oral Q0600 Motley-Mangrum, Jadeka A, PMHNP   75 mcg at 12/25/22 0639   memantine (NAMENDA XR) 24 hr capsule 28 mg  28 mg Oral Daily Motley-Mangrum, Jadeka A, PMHNP   28 mg at 12/25/22 0946   QUEtiapine (SEROQUEL) tablet 25 mg  25 mg Oral QHS Motley-Mangrum, Jadeka A, PMHNP   25 mg at 12/24/22 2107   Current Outpatient Medications  Medication Sig Dispense Refill   famotidine (PEPCID) 40 MG tablet Take 40 mg by mouth at bedtime.     memantine (NAMENDA XR) 28 MG CP24 24 hr capsule TAKE 1 CAPSULE (28 MG TOTAL) BY MOUTH DAILY. 90 capsule 3    QUEtiapine (SEROQUEL) 25 MG tablet Take 1 tablet (25 mg total) by mouth at bedtime. 30 tablet 0   divalproex (DEPAKOTE SPRINKLE) 125 MG capsule Take 1 capsule (125 mg total) by mouth 3 (three) times daily. 90 capsule 0   [START ON 12/26/2022] DULoxetine 40 MG CPEP Take 0.5 capsules (20 mg total) by mouth daily. 15 capsule 0   [START ON 12/26/2022] levothyroxine (SYNTHROID) 75 MCG tablet Take 1 tablet (75 mcg total) by mouth daily at 6 (six) AM. 30 tablet 0   QUEtiapine (SEROQUEL) 25 MG tablet Take 1 tablet (25 mg total) by mouth at bedtime. 30 tablet 0   PTA Medications: (Not in a hospital admission)   [place] Scale:  Flowsheet Row ED  from 12/23/2022 in [facility] Emergency Department at [facility] ED from 01/07/2021 in [facility] Emergency Department at [facility]  C-SSRS RISK CATEGORY No Risk Error: Question 6 not populated       Musculoskeletal: Strength & Muscle Tone:  seen in bed resting Gait & Station:  seen in bed resting Patient leans: resting  Psychiatric Specialty Exam: Presentation  General Appearance:  Casual; Neat  Eye Contact: Good  Speech: Clear and Coherent; Normal Rate  Speech Volume: Normal  Handedness: Right   Mood and Affect  Mood: Euthymic  Affect: Congruent   Thought Process  Thought Processes: Coherent; Linear  Descriptions of Associations:Intact  Orientation:Partial  Thought Content:Logical; Illogical  History of Schizophrenia/Schizoaffective disorder:No data recorded Duration of Psychotic Symptoms:No data recorded Hallucinations:Hallucinations: None  Ideas of Reference:None  Suicidal Thoughts:Suicidal Thoughts: No  Homicidal Thoughts:Homicidal Thoughts: No   Sensorium  Memory: Immediate Fair; Recent Poor; Remote Poor; Other (comment) (hx Dementia, memory impaired)  Judgment: Poor  Insight: Shallow   Executive Functions  Concentration: Good  Attention Span: Good  Recall: Good  Fund of  Knowledge: Poor  Language: Fair   Psychomotor Activity  Psychomotor Activity: Psychomotor Activity: Normal   Assets  Assets: Manufacturing systems engineer; Housing; Social Support; Physical Health   Sleep  Sleep: Sleep: Good    Physical Exam: Physical Exam ROS Blood pressure (!) 108/48, pulse 81, temperature 97.8 F (36.6 C), temperature source Oral, resp. rate 16, SpO2 100 %. There is no height or weight on file to calculate BMI.   Demographic Factors:  Age 77 or older, Divorced or widowed, Caucasian, and Resident of Memory care unit  Loss Factors: Memory/cognitive impairment  Historical Factors: NA  Risk Reduction Factors:   Positive social support  Continued Clinical Symptoms:  Depression:   Insomnia More than one psychiatric diagnosis Previous Psychiatric Diagnoses and Treatments  Cognitive Features That Contribute To Risk:  None    Suicide Risk:  Minimal: No identifiable suicidal ideation.  Patients presenting with no risk factors but with morbid ruminations; may be classified as minimal risk based on the severity of the depressive symptoms   Follow-up Information     [facility] In 2 weeks.   Specialty: Neurology Contact information: [address] [place] [address] [phone]                Plan Of Care/Follow-up recommendations:  Activity:  as tolerated Diet:  Regular soft  Medical Decision Making: Patient is Psychiatrically cleared to go back to Memory care unit.  Son is arranging a new facility for patient to move in today.  Problem 1: Dementia with behavioral disturbance  Disposition: Discharge  Earney Navy, NP---PMHNP-BC 12/25/2022, 10:33 AM

## 2022-12-25 NOTE — NC FL2 (Signed)
MEDICAID [id number] LEVEL OF CARE FORM     IDENTIFICATION  Patient Name: Jessica Whitney Birthdate: [birth year] Sex: female Admission Date (Current Location): 12/23/2022  [facility] and [place] Number:  Producer, television/film/video and Address:  [facility],  [address], [place] [address]      Provider Number: [id number]  Attending Physician Name and Address:  Default, Provider, MD  Relative Name and Phone Number:  Rashada, Gerner (Son) [phone]    Current Level of Care: Hospital Recommended Level of Care: Memory Care Prior Approval Number:    Date Approved/Denied:   PASRR Number:    Discharge Plan: Other (Comment) (Memory Care Unit)    Current Diagnoses: Patient Active Problem List   Diagnosis Date Noted   Dementia with behavioral disturbance (HCC) 12/24/2022   Vitamin B12 deficiency    Vitamin D deficiency    Acute pancreatitis 08/30/2019   Hypothyroidism 08/30/2019   GERD (gastroesophageal reflux disease) 08/30/2019   Fibromyalgia 08/30/2019   Depression 08/30/2019   Hypokalemia 08/30/2019    Orientation RESPIRATION BLADDER Height & Weight     Self  Normal Incontinent Weight:   Height:     BEHAVIORAL SYMPTOMS/MOOD NEUROLOGICAL BOWEL NUTRITION STATUS      Incontinent Diet (Regular)  AMBULATORY STATUS COMMUNICATION OF NEEDS Skin   Independent Verbally Normal                       Personal Care Assistance Level of Assistance  Bathing, Feeding, Dressing Bathing Assistance: Limited assistance Feeding assistance: Limited assistance Dressing Assistance: Limited assistance     Functional Limitations Info             SPECIAL CARE FACTORS FREQUENCY                       Contractures Contractures Info: Not present    Additional Factors Info  Code Status, Allergies Code Status Info: Full Allergies Info: Sulfa Antibiotics           Current Medications (12/25/2022):  This is the current hospital active medication  list Current Facility-Administered Medications  Medication Dose Route Frequency Provider Last Rate Last Admin   divalproex (DEPAKOTE SPRINKLE) capsule 125 mg  125 mg Oral TID Motley-Mangrum, Jadeka A, PMHNP   125 mg at 12/25/22 0946   DULoxetine (CYMBALTA) DR capsule 40 mg  40 mg Oral Daily Motley-Mangrum, Jadeka A, PMHNP   40 mg at 12/25/22 0946   levothyroxine (SYNTHROID) tablet 75 mcg  75 mcg Oral Q0600 Motley-Mangrum, Jadeka A, PMHNP   75 mcg at 12/25/22 0639   memantine (NAMENDA XR) 24 hr capsule 28 mg  28 mg Oral Daily Motley-Mangrum, Jadeka A, PMHNP   28 mg at 12/25/22 0946   QUEtiapine (SEROQUEL) tablet 25 mg  25 mg Oral QHS Motley-Mangrum, Jadeka A, PMHNP   25 mg at 12/24/22 2107   Current Outpatient Medications  Medication Sig Dispense Refill   famotidine (PEPCID) 40 MG tablet Take 40 mg by mouth at bedtime.     memantine (NAMENDA XR) 28 MG CP24 24 hr capsule TAKE 1 CAPSULE (28 MG TOTAL) BY MOUTH DAILY. 90 capsule 3   QUEtiapine (SEROQUEL) 25 MG tablet Take 1 tablet (25 mg total) by mouth at bedtime. 30 tablet 0   divalproex (DEPAKOTE SPRINKLE) 125 MG capsule Take 1 capsule (125 mg total) by mouth 3 (three) times daily. 90 capsule 0   [START ON 12/26/2022] DULoxetine 40 MG CPEP Take 0.5  capsules (20 mg total) by mouth daily. 15 capsule 0   [START ON 12/26/2022] levothyroxine (SYNTHROID) 75 MCG tablet Take 1 tablet (75 mcg total) by mouth daily at 6 (six) AM. 30 tablet 0   QUEtiapine (SEROQUEL) 25 MG tablet Take 1 tablet (25 mg total) by mouth at bedtime. 30 tablet 0     Discharge Medications: Please see discharge summary for a list of discharge medications.  Relevant Imaging Results:  Relevant Lab Results:   Additional Information SSN: [id number]  Princella Ion, LCSW

## 2022-12-25 NOTE — Discharge Instructions (Addendum)
It was our pleasure to provide your ER care today - we hope that you feel better.  Follow up closely with primary care doctor in the coming week. Follow up with neurology in the next couple weeks. Take depakote and seroquel as prescribed for symptom improvement.   Return to ER if worse, new symptoms, fevers, chest pain, trouble breathing, or other emergency concern.

## 2022-12-25 NOTE — ED Provider Notes (Signed)
Emergency Medicine Observation Re-evaluation Note  Jessica Whitney is a 77 y.o. female, seen on rounds today.  Pt initially presented to the ED for complaints of Psychiatric Evaluation Pt with hx dementia with episodic agitated behaviors - reportedly had pulled fire alarm at her memory care facility. Pt calm, alert, conversant. Indicates feels fine this morning.   Physical Exam  BP 120/76 (BP Location: Right Arm)   Pulse 70   Temp 97.6 F (36.4 C) (Oral)   Resp 16   SpO2 100%  Physical Exam General: resting, easily aroused, conversant.  Cardiac: regular rate.  Lungs: breathing comfortably.  Psych: normal mood and affect. Appears calm. Does not appear to be actively responding to internal stimuli. No acute psychosis noted.   ED Course / MDM    I have reviewed the labs performed to date as well as medications administered while in observation.  Recent changes in the last 24 hours include ED obs, med management, reassessment.   Plan  Pt with hx dementia, recent behaviors appear c/w same. Pt calm/cooperative. BH team has adjusted patients meds.   Pt currently appears stable for return to her facility/home.   Rec pcp f/u.      Cathren Laine, MD 12/25/22 [phone]

## 2023-01-01 DIAGNOSIS — E039 Hypothyroidism, unspecified: Secondary | ICD-10-CM | POA: Diagnosis not present

## 2023-01-01 DIAGNOSIS — F331 Major depressive disorder, recurrent, moderate: Secondary | ICD-10-CM | POA: Diagnosis not present

## 2023-01-01 DIAGNOSIS — F0390 Unspecified dementia without behavioral disturbance: Secondary | ICD-10-CM | POA: Diagnosis not present

## 2023-01-01 DIAGNOSIS — N181 Chronic kidney disease, stage 1: Secondary | ICD-10-CM | POA: Diagnosis not present

## 2023-01-01 DIAGNOSIS — E559 Vitamin D deficiency, unspecified: Secondary | ICD-10-CM | POA: Diagnosis not present

## 2023-01-01 DIAGNOSIS — G4700 Insomnia, unspecified: Secondary | ICD-10-CM | POA: Diagnosis not present

## 2023-01-01 DIAGNOSIS — K219 Gastro-esophageal reflux disease without esophagitis: Secondary | ICD-10-CM | POA: Diagnosis not present

## 2023-01-02 DIAGNOSIS — F0390 Unspecified dementia without behavioral disturbance: Secondary | ICD-10-CM | POA: Diagnosis not present

## 2023-01-02 DIAGNOSIS — F411 Generalized anxiety disorder: Secondary | ICD-10-CM | POA: Diagnosis not present

## 2023-01-02 DIAGNOSIS — F331 Major depressive disorder, recurrent, moderate: Secondary | ICD-10-CM | POA: Diagnosis not present

## 2023-01-02 DIAGNOSIS — F5105 Insomnia due to other mental disorder: Secondary | ICD-10-CM | POA: Diagnosis not present

## 2023-01-08 DIAGNOSIS — N181 Chronic kidney disease, stage 1: Secondary | ICD-10-CM | POA: Diagnosis not present

## 2023-01-08 DIAGNOSIS — E559 Vitamin D deficiency, unspecified: Secondary | ICD-10-CM | POA: Diagnosis not present

## 2023-01-08 DIAGNOSIS — F331 Major depressive disorder, recurrent, moderate: Secondary | ICD-10-CM | POA: Diagnosis not present

## 2023-01-08 DIAGNOSIS — E039 Hypothyroidism, unspecified: Secondary | ICD-10-CM | POA: Diagnosis not present

## 2023-01-08 DIAGNOSIS — F0390 Unspecified dementia without behavioral disturbance: Secondary | ICD-10-CM | POA: Diagnosis not present

## 2023-01-08 DIAGNOSIS — K219 Gastro-esophageal reflux disease without esophagitis: Secondary | ICD-10-CM | POA: Diagnosis not present

## 2023-01-08 DIAGNOSIS — G4700 Insomnia, unspecified: Secondary | ICD-10-CM | POA: Diagnosis not present

## 2023-01-10 ENCOUNTER — Other Ambulatory Visit: Payer: Self-pay | Admitting: Adult Health

## 2023-01-11 NOTE — Telephone Encounter (Signed)
Can we call and see if the patient needs a refill on this.  I know she is in a facility now they may be refilling it?

## 2023-01-15 DIAGNOSIS — E039 Hypothyroidism, unspecified: Secondary | ICD-10-CM | POA: Diagnosis not present

## 2023-01-15 DIAGNOSIS — F0390 Unspecified dementia without behavioral disturbance: Secondary | ICD-10-CM | POA: Diagnosis not present

## 2023-01-16 DIAGNOSIS — F411 Generalized anxiety disorder: Secondary | ICD-10-CM | POA: Diagnosis not present

## 2023-01-16 DIAGNOSIS — F5105 Insomnia due to other mental disorder: Secondary | ICD-10-CM | POA: Diagnosis not present

## 2023-01-16 DIAGNOSIS — F331 Major depressive disorder, recurrent, moderate: Secondary | ICD-10-CM | POA: Diagnosis not present

## 2023-01-16 DIAGNOSIS — F0390 Unspecified dementia without behavioral disturbance: Secondary | ICD-10-CM | POA: Diagnosis not present

## 2023-01-30 DIAGNOSIS — F5105 Insomnia due to other mental disorder: Secondary | ICD-10-CM | POA: Diagnosis not present

## 2023-01-30 DIAGNOSIS — F331 Major depressive disorder, recurrent, moderate: Secondary | ICD-10-CM | POA: Diagnosis not present

## 2023-01-30 DIAGNOSIS — Z79899 Other long term (current) drug therapy: Secondary | ICD-10-CM | POA: Diagnosis not present

## 2023-01-30 DIAGNOSIS — F411 Generalized anxiety disorder: Secondary | ICD-10-CM | POA: Diagnosis not present

## 2023-01-30 DIAGNOSIS — F0390 Unspecified dementia without behavioral disturbance: Secondary | ICD-10-CM | POA: Diagnosis not present

## 2023-02-05 DIAGNOSIS — F331 Major depressive disorder, recurrent, moderate: Secondary | ICD-10-CM | POA: Diagnosis not present

## 2023-02-05 DIAGNOSIS — F0390 Unspecified dementia without behavioral disturbance: Secondary | ICD-10-CM | POA: Diagnosis not present

## 2023-02-05 DIAGNOSIS — E039 Hypothyroidism, unspecified: Secondary | ICD-10-CM | POA: Diagnosis not present

## 2023-02-05 DIAGNOSIS — G4700 Insomnia, unspecified: Secondary | ICD-10-CM | POA: Diagnosis not present

## 2023-02-05 DIAGNOSIS — N181 Chronic kidney disease, stage 1: Secondary | ICD-10-CM | POA: Diagnosis not present

## 2023-02-05 DIAGNOSIS — E559 Vitamin D deficiency, unspecified: Secondary | ICD-10-CM | POA: Diagnosis not present

## 2023-02-05 DIAGNOSIS — K219 Gastro-esophageal reflux disease without esophagitis: Secondary | ICD-10-CM | POA: Diagnosis not present

## 2023-02-13 DIAGNOSIS — F03918 Unspecified dementia, unspecified severity, with other behavioral disturbance: Secondary | ICD-10-CM | POA: Diagnosis not present

## 2023-02-13 DIAGNOSIS — F411 Generalized anxiety disorder: Secondary | ICD-10-CM | POA: Diagnosis not present

## 2023-02-13 DIAGNOSIS — F331 Major depressive disorder, recurrent, moderate: Secondary | ICD-10-CM | POA: Diagnosis not present

## 2023-02-13 DIAGNOSIS — F5105 Insomnia due to other mental disorder: Secondary | ICD-10-CM | POA: Diagnosis not present

## 2023-02-14 DIAGNOSIS — R451 Restlessness and agitation: Secondary | ICD-10-CM | POA: Diagnosis not present

## 2023-02-14 DIAGNOSIS — F411 Generalized anxiety disorder: Secondary | ICD-10-CM | POA: Diagnosis not present

## 2023-02-14 DIAGNOSIS — F59 Unspecified behavioral syndromes associated with physiological disturbances and physical factors: Secondary | ICD-10-CM | POA: Diagnosis not present

## 2023-02-14 DIAGNOSIS — F03918 Unspecified dementia, unspecified severity, with other behavioral disturbance: Secondary | ICD-10-CM | POA: Diagnosis not present

## 2023-02-14 DIAGNOSIS — N390 Urinary tract infection, site not specified: Secondary | ICD-10-CM | POA: Diagnosis not present

## 2023-02-14 DIAGNOSIS — N181 Chronic kidney disease, stage 1: Secondary | ICD-10-CM | POA: Diagnosis not present

## 2023-02-19 DIAGNOSIS — N390 Urinary tract infection, site not specified: Secondary | ICD-10-CM | POA: Diagnosis not present

## 2023-02-19 DIAGNOSIS — R451 Restlessness and agitation: Secondary | ICD-10-CM | POA: Diagnosis not present

## 2023-02-19 DIAGNOSIS — F03918 Unspecified dementia, unspecified severity, with other behavioral disturbance: Secondary | ICD-10-CM | POA: Diagnosis not present

## 2023-02-19 DIAGNOSIS — F411 Generalized anxiety disorder: Secondary | ICD-10-CM | POA: Diagnosis not present

## 2023-02-22 ENCOUNTER — Emergency Department (HOSPITAL_COMMUNITY)
Admission: EM | Admit: 2023-02-22 | Discharge: 2023-02-22 | Disposition: A | Discharge status: Home / Self Care | Payer: Medicare HMO | Attending: Emergency Medicine | Admitting: Emergency Medicine

## 2023-02-22 ENCOUNTER — Emergency Department (HOSPITAL_COMMUNITY): Payer: Medicare HMO

## 2023-02-22 ENCOUNTER — Encounter (HOSPITAL_COMMUNITY): Payer: Self-pay

## 2023-02-22 DIAGNOSIS — G9389 Other specified disorders of brain: Secondary | ICD-10-CM | POA: Diagnosis not present

## 2023-02-22 DIAGNOSIS — S0003XA Contusion of scalp, initial encounter: Secondary | ICD-10-CM

## 2023-02-22 DIAGNOSIS — F0280 Dementia in other diseases classified elsewhere without behavioral disturbance: Secondary | ICD-10-CM | POA: Insufficient documentation

## 2023-02-22 DIAGNOSIS — E039 Hypothyroidism, unspecified: Secondary | ICD-10-CM | POA: Diagnosis not present

## 2023-02-22 DIAGNOSIS — I959 Hypotension, unspecified: Secondary | ICD-10-CM | POA: Diagnosis not present

## 2023-02-22 DIAGNOSIS — S0990XA Unspecified injury of head, initial encounter: Secondary | ICD-10-CM | POA: Diagnosis not present

## 2023-02-22 DIAGNOSIS — I6782 Cerebral ischemia: Secondary | ICD-10-CM | POA: Diagnosis not present

## 2023-02-22 DIAGNOSIS — W19XXXA Unspecified fall, initial encounter: Secondary | ICD-10-CM

## 2023-02-22 DIAGNOSIS — R519 Headache, unspecified: Secondary | ICD-10-CM | POA: Diagnosis not present

## 2023-02-22 DIAGNOSIS — W0110XA Fall on same level from slipping, tripping and stumbling with subsequent striking against unspecified object, initial encounter: Secondary | ICD-10-CM | POA: Diagnosis not present

## 2023-02-22 NOTE — Discharge Instructions (Addendum)
CAT scan of the head today was normal without signs of injury.  Vital signs were normal.  If she starts having fever, nausea vomiting, refusing to eat you to return to the emergency room.

## 2023-02-22 NOTE — ED Provider Notes (Signed)
[place] EMERGENCY DEPARTMENT AT [facility] Provider Note   CSN: [id number] Arrival date & time: 02/22/23  1638     History  Chief Complaint  Patient presents with   Marletta Lor    Jessica Whitney is a 77 y.o. female.  Patient is a 77 year old female with a history of hypothyroidism, dementia, GERD who is presenting today after a fall at her memory care unit.  Patient had a witnessed fall by staff where she fell backwards striking the right side of her head on the ground.  Patient does not take any anticoagulation.  Staff reports no loss of consciousness.  They reported she was at her baseline prior to leaving their facility.  Patient has no complaints.  She denies any neck pain.  She is able to follow commands however due to her dementia is not able to give significant history.  The history is provided by the patient. The history is limited by the absence of a caregiver.  Fall       Home Medications Prior to Admission medications   Medication Sig Start Date End Date Taking? Authorizing Provider  divalproex (DEPAKOTE SPRINKLE) 125 MG capsule Take 1 capsule (125 mg total) by mouth 3 (three) times daily. 12/25/22 01/24/23  Earney Navy, NP  DULoxetine 40 MG CPEP Take 0.5 capsules (20 mg total) by mouth daily. 12/26/22 01/25/23  Earney Navy, NP  famotidine (PEPCID) 40 MG tablet Take 40 mg by mouth at bedtime.    [provider]  levothyroxine (SYNTHROID) 75 MCG tablet Take 1 tablet (75 mcg total) by mouth daily at 6 (six) AM. 12/26/22 01/25/23  Earney Navy, NP  memantine (NAMENDA XR) 28 MG CP24 24 hr capsule TAKE 1 CAPSULE EVERY DAY 01/12/23   Butch Penny, NP  QUEtiapine (SEROQUEL) 25 MG tablet Take 1 tablet (25 mg total) by mouth at bedtime. 12/25/22   Cathren Laine, MD  QUEtiapine (SEROQUEL) 25 MG tablet Take 1 tablet (25 mg total) by mouth at bedtime. 12/25/22 01/24/23  Earney Navy, NP      Allergies    Sulfa antibiotics    Review of Systems    Review of Systems  Physical Exam Updated Vital Signs BP 121/74   Pulse 95   Temp 98.3 F (36.8 C) (Oral)   Resp 18   SpO2 98%  Physical Exam Vitals and nursing note reviewed.  Constitutional:      General: She is not in acute distress.    Appearance: She is well-developed.  HENT:     Head: Normocephalic and atraumatic.   Eyes:     Pupils: Pupils are equal, round, and reactive to light.  Cardiovascular:     Rate and Rhythm: Normal rate and regular rhythm.     Heart sounds: Normal heart sounds. No murmur heard.    No friction rub.  Pulmonary:     Effort: Pulmonary effort is normal.     Breath sounds: Normal breath sounds. No wheezing or rales.  Abdominal:     General: Bowel sounds are normal. There is no distension.     Palpations: Abdomen is soft.     Tenderness: There is no abdominal tenderness. There is no guarding or rebound.  Musculoskeletal:        General: No tenderness. Normal range of motion.     Cervical back: Normal range of motion and neck supple. No tenderness.     Comments: No edema.  Able to range bilateral hips, knees, ankles, elbows  and shoulders without pain  Skin:    General: Skin is warm and dry.     Findings: No rash.  Neurological:     Mental Status: She is alert.     Cranial Nerves: No cranial nerve deficit.     Sensory: No sensory deficit.     Motor: No weakness.     Comments: 5 out of 5 strength in all 4 extremities.  Oriented x 2  Psychiatric:        Behavior: Behavior normal.     ED Results / Procedures / Treatments   Labs (all labs ordered are listed, but only abnormal results are displayed) Labs Reviewed - No data to display  EKG None  Radiology CT Head Wo Contrast  Result Date: 02/22/2023 CLINICAL DATA:  Minor head trauma due to a fall. EXAM: CT HEAD WITHOUT CONTRAST TECHNIQUE: Contiguous axial images were obtained from the base of the skull through the vertex without intravenous contrast. RADIATION DOSE REDUCTION: This exam  was performed according to the departmental dose-optimization program which includes automated exposure control, adjustment of the mA and/or kV according to patient size and/or use of iterative reconstruction technique. COMPARISON:  CT head 01/07/2021.  MRI brain 05/30/2019 FINDINGS: Brain: Severe deterioration of the technical quality of the examination due to motion artifact. Diffuse cerebral atrophy. Ventricular dilatation consistent with central atrophy. Low-attenuation changes in the deep white matter consistent with small vessel ischemia. No abnormal extra-axial fluid collections. No mass effect or midline shift. Gray-white matter junctions are distinct. Basal cisterns are not effaced. No acute intracranial hemorrhage. Vascular: No hyperdense vessel or unexpected calcification. Skull: Normal. Negative for fracture or focal lesion. Sinuses/Orbits: No acute finding. Other: Severe motion artifact significantly limits diagnostic capability of this examination. IMPRESSION: 1. Severe motion artifact limits examination. 2. As visualized, no acute abnormality is identified. Chronic atrophy and small vessel ischemic changes are present. Electronically Signed   By: Burman Nieves M.D.   On: 02/22/2023 18:49    Procedures Procedures    Medications Ordered in ED Medications - No data to display  ED Course/ Medical Decision Making/ A&P                                 Medical Decision Making Amount and/or Complexity of Data Reviewed Radiology: ordered and independent interpretation performed. Decision-making details documented in ED Course.   Patient presenting today after a witnessed fall at her facility where she fell backwards.  No loss of consciousness and no anticoagulation.  Patient does have a hematoma to the occiput but no other obvious signs of trauma.  She is able to follow commands and appears to be at her baseline.  I have independently visualized and interpreted pt's images today. CT of the  head negative for acute bleed but significant artifact.  Radiology also reports no acute findings but there are some limitations due to artifact.  Patient's son is now at bedside.  He reports that she has been on antibiotics for urinary tract infection and his mom just seems really tired today but otherwise he has no other concerns at this time.  Discussed with him CT findings and patient was able to get out of bed and is now sitting in a chair.  Does not appear to have any other injuries.  This time he would like to take her back to the facility.  This time patient appears stable for discharge back to her  facility.  No indication for admission or further testing.         Final Clinical Impression(s) / ED Diagnoses Final diagnoses:  Fall, initial encounter  Contusion of scalp, initial encounter    Rx / DC Orders ED Discharge Orders     None         Gwyneth Sprout, MD 02/22/23 1901

## 2023-02-22 NOTE — ED Triage Notes (Signed)
PT BIB EMS for a witnessed fall at [facility], ground level, hematoma to the right side of her head.  NOT on thinners, denies LOC.  C-collar in place.  Per facility patient is at baseline, with a history of dementia and in memory care.   112/58 88 HR 18 RR  99 % RA 154 CBG

## 2023-02-22 NOTE — ED Notes (Addendum)
Called staffing for a Recruitment consultant and none were available. Anitra Lauth, MD and Lowella Bandy, RN ( charge ) notified of increased concern for safety.

## 2023-02-23 DIAGNOSIS — E039 Hypothyroidism, unspecified: Secondary | ICD-10-CM | POA: Diagnosis not present

## 2023-02-23 DIAGNOSIS — F0390 Unspecified dementia without behavioral disturbance: Secondary | ICD-10-CM | POA: Diagnosis not present

## 2023-02-27 DIAGNOSIS — F03918 Unspecified dementia, unspecified severity, with other behavioral disturbance: Secondary | ICD-10-CM | POA: Diagnosis not present

## 2023-02-27 DIAGNOSIS — F5105 Insomnia due to other mental disorder: Secondary | ICD-10-CM | POA: Diagnosis not present

## 2023-02-27 DIAGNOSIS — F411 Generalized anxiety disorder: Secondary | ICD-10-CM | POA: Diagnosis not present

## 2023-02-27 DIAGNOSIS — F062 Psychotic disorder with delusions due to known physiological condition: Secondary | ICD-10-CM | POA: Diagnosis not present

## 2023-02-27 DIAGNOSIS — F331 Major depressive disorder, recurrent, moderate: Secondary | ICD-10-CM | POA: Diagnosis not present

## 2023-02-28 DIAGNOSIS — S0083XA Contusion of other part of head, initial encounter: Secondary | ICD-10-CM | POA: Diagnosis not present

## 2023-02-28 DIAGNOSIS — Z87440 Personal history of urinary (tract) infections: Secondary | ICD-10-CM | POA: Diagnosis not present

## 2023-02-28 DIAGNOSIS — E039 Hypothyroidism, unspecified: Secondary | ICD-10-CM | POA: Diagnosis not present

## 2023-02-28 DIAGNOSIS — M25552 Pain in left hip: Secondary | ICD-10-CM | POA: Diagnosis not present

## 2023-02-28 DIAGNOSIS — R4182 Altered mental status, unspecified: Secondary | ICD-10-CM | POA: Diagnosis not present

## 2023-02-28 DIAGNOSIS — M25551 Pain in right hip: Secondary | ICD-10-CM | POA: Diagnosis not present

## 2023-02-28 DIAGNOSIS — R296 Repeated falls: Secondary | ICD-10-CM | POA: Diagnosis not present

## 2023-02-28 DIAGNOSIS — R946 Abnormal results of thyroid function studies: Secondary | ICD-10-CM | POA: Diagnosis not present

## 2023-02-28 DIAGNOSIS — S0990XA Unspecified injury of head, initial encounter: Secondary | ICD-10-CM | POA: Diagnosis not present

## 2023-02-28 DIAGNOSIS — Z79890 Hormone replacement therapy (postmenopausal): Secondary | ICD-10-CM | POA: Diagnosis not present

## 2023-02-28 DIAGNOSIS — F0390 Unspecified dementia without behavioral disturbance: Secondary | ICD-10-CM | POA: Diagnosis not present

## 2023-03-01 DIAGNOSIS — M6281 Muscle weakness (generalized): Secondary | ICD-10-CM | POA: Diagnosis not present

## 2023-03-01 DIAGNOSIS — R296 Repeated falls: Secondary | ICD-10-CM | POA: Diagnosis not present

## 2023-03-01 DIAGNOSIS — N390 Urinary tract infection, site not specified: Secondary | ICD-10-CM | POA: Diagnosis not present

## 2023-03-01 DIAGNOSIS — F03918 Unspecified dementia, unspecified severity, with other behavioral disturbance: Secondary | ICD-10-CM | POA: Diagnosis not present

## 2023-03-05 DIAGNOSIS — F331 Major depressive disorder, recurrent, moderate: Secondary | ICD-10-CM | POA: Diagnosis not present

## 2023-03-05 DIAGNOSIS — F03918 Unspecified dementia, unspecified severity, with other behavioral disturbance: Secondary | ICD-10-CM | POA: Diagnosis not present

## 2023-03-05 DIAGNOSIS — G4700 Insomnia, unspecified: Secondary | ICD-10-CM | POA: Diagnosis not present

## 2023-03-05 DIAGNOSIS — K219 Gastro-esophageal reflux disease without esophagitis: Secondary | ICD-10-CM | POA: Diagnosis not present

## 2023-03-05 DIAGNOSIS — E039 Hypothyroidism, unspecified: Secondary | ICD-10-CM | POA: Diagnosis not present

## 2023-03-05 DIAGNOSIS — N181 Chronic kidney disease, stage 1: Secondary | ICD-10-CM | POA: Diagnosis not present

## 2023-03-05 DIAGNOSIS — E559 Vitamin D deficiency, unspecified: Secondary | ICD-10-CM | POA: Diagnosis not present

## 2023-03-09 DIAGNOSIS — R2681 Unsteadiness on feet: Secondary | ICD-10-CM | POA: Diagnosis not present

## 2023-03-09 DIAGNOSIS — R296 Repeated falls: Secondary | ICD-10-CM | POA: Diagnosis not present

## 2023-03-10 DIAGNOSIS — M62562 Muscle wasting and atrophy, not elsewhere classified, left lower leg: Secondary | ICD-10-CM | POA: Diagnosis not present

## 2023-03-10 DIAGNOSIS — E039 Hypothyroidism, unspecified: Secondary | ICD-10-CM | POA: Diagnosis not present

## 2023-03-10 DIAGNOSIS — M62561 Muscle wasting and atrophy, not elsewhere classified, right lower leg: Secondary | ICD-10-CM | POA: Diagnosis not present

## 2023-03-10 DIAGNOSIS — M62551 Muscle wasting and atrophy, not elsewhere classified, right thigh: Secondary | ICD-10-CM | POA: Diagnosis not present

## 2023-03-10 DIAGNOSIS — M62552 Muscle wasting and atrophy, not elsewhere classified, left thigh: Secondary | ICD-10-CM | POA: Diagnosis not present

## 2023-03-12 DIAGNOSIS — M62551 Muscle wasting and atrophy, not elsewhere classified, right thigh: Secondary | ICD-10-CM | POA: Diagnosis not present

## 2023-03-12 DIAGNOSIS — R41841 Cognitive communication deficit: Secondary | ICD-10-CM | POA: Diagnosis not present

## 2023-03-12 DIAGNOSIS — R4789 Other speech disturbances: Secondary | ICD-10-CM | POA: Diagnosis not present

## 2023-03-12 DIAGNOSIS — R1310 Dysphagia, unspecified: Secondary | ICD-10-CM | POA: Diagnosis not present

## 2023-03-12 DIAGNOSIS — M62562 Muscle wasting and atrophy, not elsewhere classified, left lower leg: Secondary | ICD-10-CM | POA: Diagnosis not present

## 2023-03-12 DIAGNOSIS — M62561 Muscle wasting and atrophy, not elsewhere classified, right lower leg: Secondary | ICD-10-CM | POA: Diagnosis not present

## 2023-03-12 DIAGNOSIS — E039 Hypothyroidism, unspecified: Secondary | ICD-10-CM | POA: Diagnosis not present

## 2023-03-12 DIAGNOSIS — R488 Other symbolic dysfunctions: Secondary | ICD-10-CM | POA: Diagnosis not present

## 2023-03-12 DIAGNOSIS — M62552 Muscle wasting and atrophy, not elsewhere classified, left thigh: Secondary | ICD-10-CM | POA: Diagnosis not present

## 2023-03-13 DIAGNOSIS — Z8659 Personal history of other mental and behavioral disorders: Secondary | ICD-10-CM | POA: Diagnosis not present

## 2023-03-13 DIAGNOSIS — F411 Generalized anxiety disorder: Secondary | ICD-10-CM | POA: Diagnosis not present

## 2023-03-13 DIAGNOSIS — F5105 Insomnia due to other mental disorder: Secondary | ICD-10-CM | POA: Diagnosis not present

## 2023-03-13 DIAGNOSIS — F0390 Unspecified dementia without behavioral disturbance: Secondary | ICD-10-CM | POA: Diagnosis not present

## 2023-03-13 DIAGNOSIS — F331 Major depressive disorder, recurrent, moderate: Secondary | ICD-10-CM | POA: Diagnosis not present

## 2023-03-13 DIAGNOSIS — Z79899 Other long term (current) drug therapy: Secondary | ICD-10-CM | POA: Diagnosis not present

## 2023-03-14 DIAGNOSIS — R2681 Unsteadiness on feet: Secondary | ICD-10-CM | POA: Diagnosis not present

## 2023-03-14 DIAGNOSIS — E039 Hypothyroidism, unspecified: Secondary | ICD-10-CM | POA: Diagnosis not present

## 2023-03-14 DIAGNOSIS — M62551 Muscle wasting and atrophy, not elsewhere classified, right thigh: Secondary | ICD-10-CM | POA: Diagnosis not present

## 2023-03-14 DIAGNOSIS — M62561 Muscle wasting and atrophy, not elsewhere classified, right lower leg: Secondary | ICD-10-CM | POA: Diagnosis not present

## 2023-03-14 DIAGNOSIS — R296 Repeated falls: Secondary | ICD-10-CM | POA: Diagnosis not present

## 2023-03-14 DIAGNOSIS — M62552 Muscle wasting and atrophy, not elsewhere classified, left thigh: Secondary | ICD-10-CM | POA: Diagnosis not present

## 2023-03-14 DIAGNOSIS — M62562 Muscle wasting and atrophy, not elsewhere classified, left lower leg: Secondary | ICD-10-CM | POA: Diagnosis not present

## 2023-03-15 DIAGNOSIS — M62552 Muscle wasting and atrophy, not elsewhere classified, left thigh: Secondary | ICD-10-CM | POA: Diagnosis not present

## 2023-03-15 DIAGNOSIS — N181 Chronic kidney disease, stage 1: Secondary | ICD-10-CM | POA: Diagnosis not present

## 2023-03-15 DIAGNOSIS — E039 Hypothyroidism, unspecified: Secondary | ICD-10-CM | POA: Diagnosis not present

## 2023-03-15 DIAGNOSIS — E559 Vitamin D deficiency, unspecified: Secondary | ICD-10-CM | POA: Diagnosis not present

## 2023-03-15 DIAGNOSIS — R2681 Unsteadiness on feet: Secondary | ICD-10-CM | POA: Diagnosis not present

## 2023-03-15 DIAGNOSIS — M62561 Muscle wasting and atrophy, not elsewhere classified, right lower leg: Secondary | ICD-10-CM | POA: Diagnosis not present

## 2023-03-15 DIAGNOSIS — R296 Repeated falls: Secondary | ICD-10-CM | POA: Diagnosis not present

## 2023-03-15 DIAGNOSIS — R488 Other symbolic dysfunctions: Secondary | ICD-10-CM | POA: Diagnosis not present

## 2023-03-15 DIAGNOSIS — R41841 Cognitive communication deficit: Secondary | ICD-10-CM | POA: Diagnosis not present

## 2023-03-15 DIAGNOSIS — M62551 Muscle wasting and atrophy, not elsewhere classified, right thigh: Secondary | ICD-10-CM | POA: Diagnosis not present

## 2023-03-15 DIAGNOSIS — K219 Gastro-esophageal reflux disease without esophagitis: Secondary | ICD-10-CM | POA: Diagnosis not present

## 2023-03-15 DIAGNOSIS — R1310 Dysphagia, unspecified: Secondary | ICD-10-CM | POA: Diagnosis not present

## 2023-03-15 DIAGNOSIS — R4789 Other speech disturbances: Secondary | ICD-10-CM | POA: Diagnosis not present

## 2023-03-15 DIAGNOSIS — M62562 Muscle wasting and atrophy, not elsewhere classified, left lower leg: Secondary | ICD-10-CM | POA: Diagnosis not present

## 2023-03-16 DIAGNOSIS — R296 Repeated falls: Secondary | ICD-10-CM | POA: Diagnosis not present

## 2023-03-16 DIAGNOSIS — R2681 Unsteadiness on feet: Secondary | ICD-10-CM | POA: Diagnosis not present

## 2023-03-19 DIAGNOSIS — R296 Repeated falls: Secondary | ICD-10-CM | POA: Diagnosis not present

## 2023-03-19 DIAGNOSIS — M62552 Muscle wasting and atrophy, not elsewhere classified, left thigh: Secondary | ICD-10-CM | POA: Diagnosis not present

## 2023-03-19 DIAGNOSIS — M62562 Muscle wasting and atrophy, not elsewhere classified, left lower leg: Secondary | ICD-10-CM | POA: Diagnosis not present

## 2023-03-19 DIAGNOSIS — R2681 Unsteadiness on feet: Secondary | ICD-10-CM | POA: Diagnosis not present

## 2023-03-19 DIAGNOSIS — E039 Hypothyroidism, unspecified: Secondary | ICD-10-CM | POA: Diagnosis not present

## 2023-03-19 DIAGNOSIS — M62561 Muscle wasting and atrophy, not elsewhere classified, right lower leg: Secondary | ICD-10-CM | POA: Diagnosis not present

## 2023-03-19 DIAGNOSIS — M62551 Muscle wasting and atrophy, not elsewhere classified, right thigh: Secondary | ICD-10-CM | POA: Diagnosis not present

## 2023-03-20 DIAGNOSIS — R41841 Cognitive communication deficit: Secondary | ICD-10-CM | POA: Diagnosis not present

## 2023-03-20 DIAGNOSIS — R488 Other symbolic dysfunctions: Secondary | ICD-10-CM | POA: Diagnosis not present

## 2023-03-20 DIAGNOSIS — R4789 Other speech disturbances: Secondary | ICD-10-CM | POA: Diagnosis not present

## 2023-03-20 DIAGNOSIS — R1310 Dysphagia, unspecified: Secondary | ICD-10-CM | POA: Diagnosis not present

## 2023-03-21 DIAGNOSIS — E039 Hypothyroidism, unspecified: Secondary | ICD-10-CM | POA: Diagnosis not present

## 2023-03-21 DIAGNOSIS — R296 Repeated falls: Secondary | ICD-10-CM | POA: Diagnosis not present

## 2023-03-21 DIAGNOSIS — M62561 Muscle wasting and atrophy, not elsewhere classified, right lower leg: Secondary | ICD-10-CM | POA: Diagnosis not present

## 2023-03-21 DIAGNOSIS — M62552 Muscle wasting and atrophy, not elsewhere classified, left thigh: Secondary | ICD-10-CM | POA: Diagnosis not present

## 2023-03-21 DIAGNOSIS — M62562 Muscle wasting and atrophy, not elsewhere classified, left lower leg: Secondary | ICD-10-CM | POA: Diagnosis not present

## 2023-03-21 DIAGNOSIS — R2681 Unsteadiness on feet: Secondary | ICD-10-CM | POA: Diagnosis not present

## 2023-03-21 DIAGNOSIS — M62551 Muscle wasting and atrophy, not elsewhere classified, right thigh: Secondary | ICD-10-CM | POA: Diagnosis not present

## 2023-03-22 DIAGNOSIS — R296 Repeated falls: Secondary | ICD-10-CM | POA: Diagnosis not present

## 2023-03-22 DIAGNOSIS — R4789 Other speech disturbances: Secondary | ICD-10-CM | POA: Diagnosis not present

## 2023-03-22 DIAGNOSIS — R2681 Unsteadiness on feet: Secondary | ICD-10-CM | POA: Diagnosis not present

## 2023-03-22 DIAGNOSIS — R41841 Cognitive communication deficit: Secondary | ICD-10-CM | POA: Diagnosis not present

## 2023-03-22 DIAGNOSIS — R1310 Dysphagia, unspecified: Secondary | ICD-10-CM | POA: Diagnosis not present

## 2023-03-22 DIAGNOSIS — R488 Other symbolic dysfunctions: Secondary | ICD-10-CM | POA: Diagnosis not present

## 2023-03-23 DIAGNOSIS — R2681 Unsteadiness on feet: Secondary | ICD-10-CM | POA: Diagnosis not present

## 2023-03-23 DIAGNOSIS — M62562 Muscle wasting and atrophy, not elsewhere classified, left lower leg: Secondary | ICD-10-CM | POA: Diagnosis not present

## 2023-03-23 DIAGNOSIS — M62552 Muscle wasting and atrophy, not elsewhere classified, left thigh: Secondary | ICD-10-CM | POA: Diagnosis not present

## 2023-03-23 DIAGNOSIS — M62551 Muscle wasting and atrophy, not elsewhere classified, right thigh: Secondary | ICD-10-CM | POA: Diagnosis not present

## 2023-03-23 DIAGNOSIS — R296 Repeated falls: Secondary | ICD-10-CM | POA: Diagnosis not present

## 2023-03-23 DIAGNOSIS — M62561 Muscle wasting and atrophy, not elsewhere classified, right lower leg: Secondary | ICD-10-CM | POA: Diagnosis not present

## 2023-03-23 DIAGNOSIS — E039 Hypothyroidism, unspecified: Secondary | ICD-10-CM | POA: Diagnosis not present

## 2023-03-26 DIAGNOSIS — M62562 Muscle wasting and atrophy, not elsewhere classified, left lower leg: Secondary | ICD-10-CM | POA: Diagnosis not present

## 2023-03-26 DIAGNOSIS — R296 Repeated falls: Secondary | ICD-10-CM | POA: Diagnosis not present

## 2023-03-26 DIAGNOSIS — M62551 Muscle wasting and atrophy, not elsewhere classified, right thigh: Secondary | ICD-10-CM | POA: Diagnosis not present

## 2023-03-26 DIAGNOSIS — E039 Hypothyroidism, unspecified: Secondary | ICD-10-CM | POA: Diagnosis not present

## 2023-03-26 DIAGNOSIS — R2681 Unsteadiness on feet: Secondary | ICD-10-CM | POA: Diagnosis not present

## 2023-03-26 DIAGNOSIS — M79641 Pain in right hand: Secondary | ICD-10-CM | POA: Diagnosis not present

## 2023-03-26 DIAGNOSIS — M62561 Muscle wasting and atrophy, not elsewhere classified, right lower leg: Secondary | ICD-10-CM | POA: Diagnosis not present

## 2023-03-26 DIAGNOSIS — F0390 Unspecified dementia without behavioral disturbance: Secondary | ICD-10-CM | POA: Diagnosis not present

## 2023-03-26 DIAGNOSIS — M62552 Muscle wasting and atrophy, not elsewhere classified, left thigh: Secondary | ICD-10-CM | POA: Diagnosis not present

## 2023-03-27 DIAGNOSIS — R488 Other symbolic dysfunctions: Secondary | ICD-10-CM | POA: Diagnosis not present

## 2023-03-27 DIAGNOSIS — F5105 Insomnia due to other mental disorder: Secondary | ICD-10-CM | POA: Diagnosis not present

## 2023-03-27 DIAGNOSIS — F331 Major depressive disorder, recurrent, moderate: Secondary | ICD-10-CM | POA: Diagnosis not present

## 2023-03-27 DIAGNOSIS — R4789 Other speech disturbances: Secondary | ICD-10-CM | POA: Diagnosis not present

## 2023-03-27 DIAGNOSIS — Z79899 Other long term (current) drug therapy: Secondary | ICD-10-CM | POA: Diagnosis not present

## 2023-03-27 DIAGNOSIS — R41841 Cognitive communication deficit: Secondary | ICD-10-CM | POA: Diagnosis not present

## 2023-03-27 DIAGNOSIS — F411 Generalized anxiety disorder: Secondary | ICD-10-CM | POA: Diagnosis not present

## 2023-03-27 DIAGNOSIS — R1310 Dysphagia, unspecified: Secondary | ICD-10-CM | POA: Diagnosis not present

## 2023-03-27 DIAGNOSIS — F0390 Unspecified dementia without behavioral disturbance: Secondary | ICD-10-CM | POA: Diagnosis not present

## 2023-03-27 DIAGNOSIS — F29 Unspecified psychosis not due to a substance or known physiological condition: Secondary | ICD-10-CM | POA: Diagnosis not present

## 2023-03-28 DIAGNOSIS — M62552 Muscle wasting and atrophy, not elsewhere classified, left thigh: Secondary | ICD-10-CM | POA: Diagnosis not present

## 2023-03-28 DIAGNOSIS — M62562 Muscle wasting and atrophy, not elsewhere classified, left lower leg: Secondary | ICD-10-CM | POA: Diagnosis not present

## 2023-03-28 DIAGNOSIS — E039 Hypothyroidism, unspecified: Secondary | ICD-10-CM | POA: Diagnosis not present

## 2023-03-28 DIAGNOSIS — M62551 Muscle wasting and atrophy, not elsewhere classified, right thigh: Secondary | ICD-10-CM | POA: Diagnosis not present

## 2023-03-28 DIAGNOSIS — R2681 Unsteadiness on feet: Secondary | ICD-10-CM | POA: Diagnosis not present

## 2023-03-28 DIAGNOSIS — R296 Repeated falls: Secondary | ICD-10-CM | POA: Diagnosis not present

## 2023-03-28 DIAGNOSIS — M62561 Muscle wasting and atrophy, not elsewhere classified, right lower leg: Secondary | ICD-10-CM | POA: Diagnosis not present

## 2023-03-29 DIAGNOSIS — S60221A Contusion of right hand, initial encounter: Secondary | ICD-10-CM | POA: Diagnosis not present

## 2023-03-29 DIAGNOSIS — R296 Repeated falls: Secondary | ICD-10-CM | POA: Diagnosis not present

## 2023-03-29 DIAGNOSIS — R2681 Unsteadiness on feet: Secondary | ICD-10-CM | POA: Diagnosis not present

## 2023-03-29 DIAGNOSIS — W19XXXA Unspecified fall, initial encounter: Secondary | ICD-10-CM | POA: Diagnosis not present

## 2023-03-30 DIAGNOSIS — E039 Hypothyroidism, unspecified: Secondary | ICD-10-CM | POA: Diagnosis not present

## 2023-03-30 DIAGNOSIS — M62551 Muscle wasting and atrophy, not elsewhere classified, right thigh: Secondary | ICD-10-CM | POA: Diagnosis not present

## 2023-03-30 DIAGNOSIS — M62552 Muscle wasting and atrophy, not elsewhere classified, left thigh: Secondary | ICD-10-CM | POA: Diagnosis not present

## 2023-03-30 DIAGNOSIS — M62561 Muscle wasting and atrophy, not elsewhere classified, right lower leg: Secondary | ICD-10-CM | POA: Diagnosis not present

## 2023-03-30 DIAGNOSIS — R41841 Cognitive communication deficit: Secondary | ICD-10-CM | POA: Diagnosis not present

## 2023-03-30 DIAGNOSIS — R488 Other symbolic dysfunctions: Secondary | ICD-10-CM | POA: Diagnosis not present

## 2023-03-30 DIAGNOSIS — R1310 Dysphagia, unspecified: Secondary | ICD-10-CM | POA: Diagnosis not present

## 2023-03-30 DIAGNOSIS — R4789 Other speech disturbances: Secondary | ICD-10-CM | POA: Diagnosis not present

## 2023-03-30 DIAGNOSIS — M62562 Muscle wasting and atrophy, not elsewhere classified, left lower leg: Secondary | ICD-10-CM | POA: Diagnosis not present

## 2023-04-02 DIAGNOSIS — M62562 Muscle wasting and atrophy, not elsewhere classified, left lower leg: Secondary | ICD-10-CM | POA: Diagnosis not present

## 2023-04-02 DIAGNOSIS — M62551 Muscle wasting and atrophy, not elsewhere classified, right thigh: Secondary | ICD-10-CM | POA: Diagnosis not present

## 2023-04-02 DIAGNOSIS — R296 Repeated falls: Secondary | ICD-10-CM | POA: Diagnosis not present

## 2023-04-02 DIAGNOSIS — M62552 Muscle wasting and atrophy, not elsewhere classified, left thigh: Secondary | ICD-10-CM | POA: Diagnosis not present

## 2023-04-02 DIAGNOSIS — R2681 Unsteadiness on feet: Secondary | ICD-10-CM | POA: Diagnosis not present

## 2023-04-02 DIAGNOSIS — E039 Hypothyroidism, unspecified: Secondary | ICD-10-CM | POA: Diagnosis not present

## 2023-04-02 DIAGNOSIS — M62561 Muscle wasting and atrophy, not elsewhere classified, right lower leg: Secondary | ICD-10-CM | POA: Diagnosis not present

## 2023-04-04 DIAGNOSIS — E039 Hypothyroidism, unspecified: Secondary | ICD-10-CM | POA: Diagnosis not present

## 2023-04-04 DIAGNOSIS — M62552 Muscle wasting and atrophy, not elsewhere classified, left thigh: Secondary | ICD-10-CM | POA: Diagnosis not present

## 2023-04-04 DIAGNOSIS — R4789 Other speech disturbances: Secondary | ICD-10-CM | POA: Diagnosis not present

## 2023-04-04 DIAGNOSIS — M62561 Muscle wasting and atrophy, not elsewhere classified, right lower leg: Secondary | ICD-10-CM | POA: Diagnosis not present

## 2023-04-04 DIAGNOSIS — R488 Other symbolic dysfunctions: Secondary | ICD-10-CM | POA: Diagnosis not present

## 2023-04-04 DIAGNOSIS — R41841 Cognitive communication deficit: Secondary | ICD-10-CM | POA: Diagnosis not present

## 2023-04-04 DIAGNOSIS — M62551 Muscle wasting and atrophy, not elsewhere classified, right thigh: Secondary | ICD-10-CM | POA: Diagnosis not present

## 2023-04-04 DIAGNOSIS — M62562 Muscle wasting and atrophy, not elsewhere classified, left lower leg: Secondary | ICD-10-CM | POA: Diagnosis not present

## 2023-04-04 DIAGNOSIS — R296 Repeated falls: Secondary | ICD-10-CM | POA: Diagnosis not present

## 2023-04-04 DIAGNOSIS — R2681 Unsteadiness on feet: Secondary | ICD-10-CM | POA: Diagnosis not present

## 2023-04-04 DIAGNOSIS — R1310 Dysphagia, unspecified: Secondary | ICD-10-CM | POA: Diagnosis not present

## 2023-04-05 DIAGNOSIS — E039 Hypothyroidism, unspecified: Secondary | ICD-10-CM | POA: Diagnosis not present

## 2023-04-06 DIAGNOSIS — R296 Repeated falls: Secondary | ICD-10-CM | POA: Diagnosis not present

## 2023-04-06 DIAGNOSIS — R2681 Unsteadiness on feet: Secondary | ICD-10-CM | POA: Diagnosis not present

## 2023-04-09 DIAGNOSIS — R41841 Cognitive communication deficit: Secondary | ICD-10-CM | POA: Diagnosis not present

## 2023-04-09 DIAGNOSIS — R1310 Dysphagia, unspecified: Secondary | ICD-10-CM | POA: Diagnosis not present

## 2023-04-09 DIAGNOSIS — R4789 Other speech disturbances: Secondary | ICD-10-CM | POA: Diagnosis not present

## 2023-04-09 DIAGNOSIS — R2681 Unsteadiness on feet: Secondary | ICD-10-CM | POA: Diagnosis not present

## 2023-04-09 DIAGNOSIS — G4700 Insomnia, unspecified: Secondary | ICD-10-CM | POA: Diagnosis not present

## 2023-04-09 DIAGNOSIS — M62562 Muscle wasting and atrophy, not elsewhere classified, left lower leg: Secondary | ICD-10-CM | POA: Diagnosis not present

## 2023-04-09 DIAGNOSIS — E559 Vitamin D deficiency, unspecified: Secondary | ICD-10-CM | POA: Diagnosis not present

## 2023-04-09 DIAGNOSIS — M62561 Muscle wasting and atrophy, not elsewhere classified, right lower leg: Secondary | ICD-10-CM | POA: Diagnosis not present

## 2023-04-09 DIAGNOSIS — K219 Gastro-esophageal reflux disease without esophagitis: Secondary | ICD-10-CM | POA: Diagnosis not present

## 2023-04-09 DIAGNOSIS — M62552 Muscle wasting and atrophy, not elsewhere classified, left thigh: Secondary | ICD-10-CM | POA: Diagnosis not present

## 2023-04-09 DIAGNOSIS — R296 Repeated falls: Secondary | ICD-10-CM | POA: Diagnosis not present

## 2023-04-09 DIAGNOSIS — M62551 Muscle wasting and atrophy, not elsewhere classified, right thigh: Secondary | ICD-10-CM | POA: Diagnosis not present

## 2023-04-09 DIAGNOSIS — R488 Other symbolic dysfunctions: Secondary | ICD-10-CM | POA: Diagnosis not present

## 2023-04-09 DIAGNOSIS — N1831 Chronic kidney disease, stage 3a: Secondary | ICD-10-CM | POA: Diagnosis not present

## 2023-04-09 DIAGNOSIS — E039 Hypothyroidism, unspecified: Secondary | ICD-10-CM | POA: Diagnosis not present

## 2023-04-09 DIAGNOSIS — F03918 Unspecified dementia, unspecified severity, with other behavioral disturbance: Secondary | ICD-10-CM | POA: Diagnosis not present

## 2023-04-11 DIAGNOSIS — R296 Repeated falls: Secondary | ICD-10-CM | POA: Diagnosis not present

## 2023-04-11 DIAGNOSIS — R2681 Unsteadiness on feet: Secondary | ICD-10-CM | POA: Diagnosis not present

## 2023-04-12 DIAGNOSIS — M62561 Muscle wasting and atrophy, not elsewhere classified, right lower leg: Secondary | ICD-10-CM | POA: Diagnosis not present

## 2023-04-12 DIAGNOSIS — E039 Hypothyroidism, unspecified: Secondary | ICD-10-CM | POA: Diagnosis not present

## 2023-04-12 DIAGNOSIS — M62562 Muscle wasting and atrophy, not elsewhere classified, left lower leg: Secondary | ICD-10-CM | POA: Diagnosis not present

## 2023-04-12 DIAGNOSIS — M62551 Muscle wasting and atrophy, not elsewhere classified, right thigh: Secondary | ICD-10-CM | POA: Diagnosis not present

## 2023-04-12 DIAGNOSIS — M62552 Muscle wasting and atrophy, not elsewhere classified, left thigh: Secondary | ICD-10-CM | POA: Diagnosis not present

## 2023-04-13 DIAGNOSIS — M62562 Muscle wasting and atrophy, not elsewhere classified, left lower leg: Secondary | ICD-10-CM | POA: Diagnosis not present

## 2023-04-13 DIAGNOSIS — R2681 Unsteadiness on feet: Secondary | ICD-10-CM | POA: Diagnosis not present

## 2023-04-13 DIAGNOSIS — M62552 Muscle wasting and atrophy, not elsewhere classified, left thigh: Secondary | ICD-10-CM | POA: Diagnosis not present

## 2023-04-13 DIAGNOSIS — E039 Hypothyroidism, unspecified: Secondary | ICD-10-CM | POA: Diagnosis not present

## 2023-04-13 DIAGNOSIS — M62561 Muscle wasting and atrophy, not elsewhere classified, right lower leg: Secondary | ICD-10-CM | POA: Diagnosis not present

## 2023-04-13 DIAGNOSIS — M62551 Muscle wasting and atrophy, not elsewhere classified, right thigh: Secondary | ICD-10-CM | POA: Diagnosis not present

## 2023-04-13 DIAGNOSIS — R296 Repeated falls: Secondary | ICD-10-CM | POA: Diagnosis not present

## 2023-04-16 DIAGNOSIS — M62551 Muscle wasting and atrophy, not elsewhere classified, right thigh: Secondary | ICD-10-CM | POA: Diagnosis not present

## 2023-04-16 DIAGNOSIS — F331 Major depressive disorder, recurrent, moderate: Secondary | ICD-10-CM | POA: Diagnosis not present

## 2023-04-16 DIAGNOSIS — F0390 Unspecified dementia without behavioral disturbance: Secondary | ICD-10-CM | POA: Diagnosis not present

## 2023-04-16 DIAGNOSIS — R2681 Unsteadiness on feet: Secondary | ICD-10-CM | POA: Diagnosis not present

## 2023-04-16 DIAGNOSIS — E039 Hypothyroidism, unspecified: Secondary | ICD-10-CM | POA: Diagnosis not present

## 2023-04-16 DIAGNOSIS — R1310 Dysphagia, unspecified: Secondary | ICD-10-CM | POA: Diagnosis not present

## 2023-04-16 DIAGNOSIS — M62561 Muscle wasting and atrophy, not elsewhere classified, right lower leg: Secondary | ICD-10-CM | POA: Diagnosis not present

## 2023-04-16 DIAGNOSIS — N1831 Chronic kidney disease, stage 3a: Secondary | ICD-10-CM | POA: Diagnosis not present

## 2023-04-16 DIAGNOSIS — R296 Repeated falls: Secondary | ICD-10-CM | POA: Diagnosis not present

## 2023-04-16 DIAGNOSIS — M810 Age-related osteoporosis without current pathological fracture: Secondary | ICD-10-CM | POA: Diagnosis not present

## 2023-04-16 DIAGNOSIS — R4789 Other speech disturbances: Secondary | ICD-10-CM | POA: Diagnosis not present

## 2023-04-16 DIAGNOSIS — M545 Low back pain, unspecified: Secondary | ICD-10-CM | POA: Diagnosis not present

## 2023-04-16 DIAGNOSIS — M62562 Muscle wasting and atrophy, not elsewhere classified, left lower leg: Secondary | ICD-10-CM | POA: Diagnosis not present

## 2023-04-16 DIAGNOSIS — R488 Other symbolic dysfunctions: Secondary | ICD-10-CM | POA: Diagnosis not present

## 2023-04-16 DIAGNOSIS — M62552 Muscle wasting and atrophy, not elsewhere classified, left thigh: Secondary | ICD-10-CM | POA: Diagnosis not present

## 2023-04-16 DIAGNOSIS — R5382 Chronic fatigue, unspecified: Secondary | ICD-10-CM | POA: Diagnosis not present

## 2023-04-16 DIAGNOSIS — R41841 Cognitive communication deficit: Secondary | ICD-10-CM | POA: Diagnosis not present

## 2023-04-17 DIAGNOSIS — M62561 Muscle wasting and atrophy, not elsewhere classified, right lower leg: Secondary | ICD-10-CM | POA: Diagnosis not present

## 2023-04-17 DIAGNOSIS — M62562 Muscle wasting and atrophy, not elsewhere classified, left lower leg: Secondary | ICD-10-CM | POA: Diagnosis not present

## 2023-04-17 DIAGNOSIS — M62552 Muscle wasting and atrophy, not elsewhere classified, left thigh: Secondary | ICD-10-CM | POA: Diagnosis not present

## 2023-04-17 DIAGNOSIS — E039 Hypothyroidism, unspecified: Secondary | ICD-10-CM | POA: Diagnosis not present

## 2023-04-17 DIAGNOSIS — M62551 Muscle wasting and atrophy, not elsewhere classified, right thigh: Secondary | ICD-10-CM | POA: Diagnosis not present

## 2023-04-18 DIAGNOSIS — M62552 Muscle wasting and atrophy, not elsewhere classified, left thigh: Secondary | ICD-10-CM | POA: Diagnosis not present

## 2023-04-18 DIAGNOSIS — S098XXA Other specified injuries of head, initial encounter: Secondary | ICD-10-CM | POA: Diagnosis not present

## 2023-04-18 DIAGNOSIS — R296 Repeated falls: Secondary | ICD-10-CM | POA: Diagnosis not present

## 2023-04-18 DIAGNOSIS — M62561 Muscle wasting and atrophy, not elsewhere classified, right lower leg: Secondary | ICD-10-CM | POA: Diagnosis not present

## 2023-04-18 DIAGNOSIS — R2681 Unsteadiness on feet: Secondary | ICD-10-CM | POA: Diagnosis not present

## 2023-04-18 DIAGNOSIS — S0083XA Contusion of other part of head, initial encounter: Secondary | ICD-10-CM | POA: Diagnosis not present

## 2023-04-18 DIAGNOSIS — M62562 Muscle wasting and atrophy, not elsewhere classified, left lower leg: Secondary | ICD-10-CM | POA: Diagnosis not present

## 2023-04-18 DIAGNOSIS — S0003XA Contusion of scalp, initial encounter: Secondary | ICD-10-CM | POA: Diagnosis not present

## 2023-04-18 DIAGNOSIS — F0390 Unspecified dementia without behavioral disturbance: Secondary | ICD-10-CM | POA: Diagnosis not present

## 2023-04-18 DIAGNOSIS — E039 Hypothyroidism, unspecified: Secondary | ICD-10-CM | POA: Diagnosis not present

## 2023-04-18 DIAGNOSIS — Z79899 Other long term (current) drug therapy: Secondary | ICD-10-CM | POA: Diagnosis not present

## 2023-04-18 DIAGNOSIS — M62551 Muscle wasting and atrophy, not elsewhere classified, right thigh: Secondary | ICD-10-CM | POA: Diagnosis not present

## 2023-04-19 DIAGNOSIS — R41841 Cognitive communication deficit: Secondary | ICD-10-CM | POA: Diagnosis not present

## 2023-04-19 DIAGNOSIS — E039 Hypothyroidism, unspecified: Secondary | ICD-10-CM | POA: Diagnosis not present

## 2023-04-19 DIAGNOSIS — R296 Repeated falls: Secondary | ICD-10-CM | POA: Diagnosis not present

## 2023-04-19 DIAGNOSIS — R488 Other symbolic dysfunctions: Secondary | ICD-10-CM | POA: Diagnosis not present

## 2023-04-19 DIAGNOSIS — R2681 Unsteadiness on feet: Secondary | ICD-10-CM | POA: Diagnosis not present

## 2023-04-19 DIAGNOSIS — M62562 Muscle wasting and atrophy, not elsewhere classified, left lower leg: Secondary | ICD-10-CM | POA: Diagnosis not present

## 2023-04-19 DIAGNOSIS — R1310 Dysphagia, unspecified: Secondary | ICD-10-CM | POA: Diagnosis not present

## 2023-04-19 DIAGNOSIS — M62552 Muscle wasting and atrophy, not elsewhere classified, left thigh: Secondary | ICD-10-CM | POA: Diagnosis not present

## 2023-04-19 DIAGNOSIS — M62551 Muscle wasting and atrophy, not elsewhere classified, right thigh: Secondary | ICD-10-CM | POA: Diagnosis not present

## 2023-04-19 DIAGNOSIS — M62561 Muscle wasting and atrophy, not elsewhere classified, right lower leg: Secondary | ICD-10-CM | POA: Diagnosis not present

## 2023-04-19 DIAGNOSIS — R4789 Other speech disturbances: Secondary | ICD-10-CM | POA: Diagnosis not present

## 2023-04-23 DIAGNOSIS — M62552 Muscle wasting and atrophy, not elsewhere classified, left thigh: Secondary | ICD-10-CM | POA: Diagnosis not present

## 2023-04-23 DIAGNOSIS — Z79899 Other long term (current) drug therapy: Secondary | ICD-10-CM | POA: Diagnosis not present

## 2023-04-23 DIAGNOSIS — F411 Generalized anxiety disorder: Secondary | ICD-10-CM | POA: Diagnosis not present

## 2023-04-23 DIAGNOSIS — F03911 Unspecified dementia, unspecified severity, with agitation: Secondary | ICD-10-CM | POA: Diagnosis not present

## 2023-04-23 DIAGNOSIS — M62551 Muscle wasting and atrophy, not elsewhere classified, right thigh: Secondary | ICD-10-CM | POA: Diagnosis not present

## 2023-04-23 DIAGNOSIS — F5105 Insomnia due to other mental disorder: Secondary | ICD-10-CM | POA: Diagnosis not present

## 2023-04-23 DIAGNOSIS — Z681 Body mass index (BMI) 19 or less, adult: Secondary | ICD-10-CM | POA: Diagnosis not present

## 2023-04-23 DIAGNOSIS — F331 Major depressive disorder, recurrent, moderate: Secondary | ICD-10-CM | POA: Diagnosis not present

## 2023-04-23 DIAGNOSIS — R2681 Unsteadiness on feet: Secondary | ICD-10-CM | POA: Diagnosis not present

## 2023-04-23 DIAGNOSIS — E039 Hypothyroidism, unspecified: Secondary | ICD-10-CM | POA: Diagnosis not present

## 2023-04-23 DIAGNOSIS — F0390 Unspecified dementia without behavioral disturbance: Secondary | ICD-10-CM | POA: Diagnosis not present

## 2023-04-23 DIAGNOSIS — M62562 Muscle wasting and atrophy, not elsewhere classified, left lower leg: Secondary | ICD-10-CM | POA: Diagnosis not present

## 2023-04-23 DIAGNOSIS — R296 Repeated falls: Secondary | ICD-10-CM | POA: Diagnosis not present

## 2023-04-23 DIAGNOSIS — M62561 Muscle wasting and atrophy, not elsewhere classified, right lower leg: Secondary | ICD-10-CM | POA: Diagnosis not present

## 2023-04-24 DIAGNOSIS — E039 Hypothyroidism, unspecified: Secondary | ICD-10-CM | POA: Diagnosis not present

## 2023-04-24 DIAGNOSIS — R488 Other symbolic dysfunctions: Secondary | ICD-10-CM | POA: Diagnosis not present

## 2023-04-24 DIAGNOSIS — M62561 Muscle wasting and atrophy, not elsewhere classified, right lower leg: Secondary | ICD-10-CM | POA: Diagnosis not present

## 2023-04-24 DIAGNOSIS — F329 Major depressive disorder, single episode, unspecified: Secondary | ICD-10-CM | POA: Diagnosis not present

## 2023-04-24 DIAGNOSIS — R41841 Cognitive communication deficit: Secondary | ICD-10-CM | POA: Diagnosis not present

## 2023-04-24 DIAGNOSIS — M62552 Muscle wasting and atrophy, not elsewhere classified, left thigh: Secondary | ICD-10-CM | POA: Diagnosis not present

## 2023-04-24 DIAGNOSIS — R4789 Other speech disturbances: Secondary | ICD-10-CM | POA: Diagnosis not present

## 2023-04-24 DIAGNOSIS — M62562 Muscle wasting and atrophy, not elsewhere classified, left lower leg: Secondary | ICD-10-CM | POA: Diagnosis not present

## 2023-04-24 DIAGNOSIS — R1310 Dysphagia, unspecified: Secondary | ICD-10-CM | POA: Diagnosis not present

## 2023-04-24 DIAGNOSIS — M62551 Muscle wasting and atrophy, not elsewhere classified, right thigh: Secondary | ICD-10-CM | POA: Diagnosis not present

## 2023-04-25 DIAGNOSIS — R4789 Other speech disturbances: Secondary | ICD-10-CM | POA: Diagnosis not present

## 2023-04-25 DIAGNOSIS — R1310 Dysphagia, unspecified: Secondary | ICD-10-CM | POA: Diagnosis not present

## 2023-04-25 DIAGNOSIS — R41841 Cognitive communication deficit: Secondary | ICD-10-CM | POA: Diagnosis not present

## 2023-04-25 DIAGNOSIS — M62552 Muscle wasting and atrophy, not elsewhere classified, left thigh: Secondary | ICD-10-CM | POA: Diagnosis not present

## 2023-04-25 DIAGNOSIS — E039 Hypothyroidism, unspecified: Secondary | ICD-10-CM | POA: Diagnosis not present

## 2023-04-25 DIAGNOSIS — R296 Repeated falls: Secondary | ICD-10-CM | POA: Diagnosis not present

## 2023-04-25 DIAGNOSIS — R488 Other symbolic dysfunctions: Secondary | ICD-10-CM | POA: Diagnosis not present

## 2023-04-25 DIAGNOSIS — R2681 Unsteadiness on feet: Secondary | ICD-10-CM | POA: Diagnosis not present

## 2023-04-25 DIAGNOSIS — M62562 Muscle wasting and atrophy, not elsewhere classified, left lower leg: Secondary | ICD-10-CM | POA: Diagnosis not present

## 2023-04-25 DIAGNOSIS — M62551 Muscle wasting and atrophy, not elsewhere classified, right thigh: Secondary | ICD-10-CM | POA: Diagnosis not present

## 2023-04-25 DIAGNOSIS — M62561 Muscle wasting and atrophy, not elsewhere classified, right lower leg: Secondary | ICD-10-CM | POA: Diagnosis not present

## 2023-04-27 DIAGNOSIS — R2681 Unsteadiness on feet: Secondary | ICD-10-CM | POA: Diagnosis not present

## 2023-04-27 DIAGNOSIS — R296 Repeated falls: Secondary | ICD-10-CM | POA: Diagnosis not present

## 2023-04-30 DIAGNOSIS — R296 Repeated falls: Secondary | ICD-10-CM | POA: Diagnosis not present

## 2023-04-30 DIAGNOSIS — E039 Hypothyroidism, unspecified: Secondary | ICD-10-CM | POA: Diagnosis not present

## 2023-04-30 DIAGNOSIS — R2681 Unsteadiness on feet: Secondary | ICD-10-CM | POA: Diagnosis not present

## 2023-04-30 DIAGNOSIS — R488 Other symbolic dysfunctions: Secondary | ICD-10-CM | POA: Diagnosis not present

## 2023-04-30 DIAGNOSIS — M62561 Muscle wasting and atrophy, not elsewhere classified, right lower leg: Secondary | ICD-10-CM | POA: Diagnosis not present

## 2023-04-30 DIAGNOSIS — M62562 Muscle wasting and atrophy, not elsewhere classified, left lower leg: Secondary | ICD-10-CM | POA: Diagnosis not present

## 2023-04-30 DIAGNOSIS — R41841 Cognitive communication deficit: Secondary | ICD-10-CM | POA: Diagnosis not present

## 2023-04-30 DIAGNOSIS — R1310 Dysphagia, unspecified: Secondary | ICD-10-CM | POA: Diagnosis not present

## 2023-04-30 DIAGNOSIS — M62552 Muscle wasting and atrophy, not elsewhere classified, left thigh: Secondary | ICD-10-CM | POA: Diagnosis not present

## 2023-04-30 DIAGNOSIS — M62551 Muscle wasting and atrophy, not elsewhere classified, right thigh: Secondary | ICD-10-CM | POA: Diagnosis not present

## 2023-04-30 DIAGNOSIS — R4789 Other speech disturbances: Secondary | ICD-10-CM | POA: Diagnosis not present

## 2023-05-01 DIAGNOSIS — R488 Other symbolic dysfunctions: Secondary | ICD-10-CM | POA: Diagnosis not present

## 2023-05-01 DIAGNOSIS — R1310 Dysphagia, unspecified: Secondary | ICD-10-CM | POA: Diagnosis not present

## 2023-05-01 DIAGNOSIS — R4789 Other speech disturbances: Secondary | ICD-10-CM | POA: Diagnosis not present

## 2023-05-01 DIAGNOSIS — R41841 Cognitive communication deficit: Secondary | ICD-10-CM | POA: Diagnosis not present

## 2023-05-02 DIAGNOSIS — M62552 Muscle wasting and atrophy, not elsewhere classified, left thigh: Secondary | ICD-10-CM | POA: Diagnosis not present

## 2023-05-02 DIAGNOSIS — E039 Hypothyroidism, unspecified: Secondary | ICD-10-CM | POA: Diagnosis not present

## 2023-05-02 DIAGNOSIS — M62551 Muscle wasting and atrophy, not elsewhere classified, right thigh: Secondary | ICD-10-CM | POA: Diagnosis not present

## 2023-05-02 DIAGNOSIS — M62561 Muscle wasting and atrophy, not elsewhere classified, right lower leg: Secondary | ICD-10-CM | POA: Diagnosis not present

## 2023-05-02 DIAGNOSIS — R296 Repeated falls: Secondary | ICD-10-CM | POA: Diagnosis not present

## 2023-05-02 DIAGNOSIS — R2681 Unsteadiness on feet: Secondary | ICD-10-CM | POA: Diagnosis not present

## 2023-05-02 DIAGNOSIS — M62562 Muscle wasting and atrophy, not elsewhere classified, left lower leg: Secondary | ICD-10-CM | POA: Diagnosis not present

## 2023-05-03 ENCOUNTER — Ambulatory Visit: Payer: Medicare HMO | Admitting: Adult Health

## 2023-05-03 ENCOUNTER — Encounter: Payer: Self-pay | Admitting: Adult Health

## 2023-05-03 VITALS — BP 100/58 | HR 90 | Ht 70.0 in | Wt 107.2 lb

## 2023-05-03 DIAGNOSIS — R296 Repeated falls: Secondary | ICD-10-CM | POA: Diagnosis not present

## 2023-05-03 DIAGNOSIS — R2681 Unsteadiness on feet: Secondary | ICD-10-CM | POA: Diagnosis not present

## 2023-05-03 DIAGNOSIS — F03B0 Unspecified dementia, moderate, without behavioral disturbance, psychotic disturbance, mood disturbance, and anxiety: Secondary | ICD-10-CM | POA: Diagnosis not present

## 2023-05-03 NOTE — Progress Notes (Addendum)
PATIENT: Jessica Whitney DOB: 08-09-1945  REASON FOR VISIT: follow up HISTORY FROM: patient Chief Complaint  Patient presents with   Follow-up    Pt in 18 with son  Son states multiple falls in last six months Son states pt escaped 2x from facility in June  Son states moved pt to [facility] in July Son states Multiple ED visit and one admission at [facility].      HISTORY OF PRESENT ILLNESS: Today 05/03/23:  Jessica Whitney is a 77 y.o. female with a history of dementia. Returns today for follow-up.  She is here today with her son.  She now lives at [facility].  They have had some issues with behavior as she gets agitated.  She is also had several hospital visits for falls.  Son states that in July she was her best.  States that she could "run in circles around others."  He states that since then she has not been herself.  He is concerned that it is due to overmedication.  He states that she is now off lorazepam.  But she remains on Depakote, Seroquel, Cymbalta and Zoloft.  Son requesting our opinion on possibly reducing or discontinuing some of this medication.   10/19/22: Jessica Whitney is a 77 y.o. female with a history of memory disturbance. Returns today for follow-up.  She is here today with her husband.  He states that at least for the last 4 nights she has been up most the night packing her belongings thinking that she needs to leave.  She then goes to sleep early morning hours and sleeps most of the day.  She remains on Namenda XR daily.  She does most things for herself however husband states that she does not bathe regularly.  Husband continues to manage all medications appointments and finances   03/30/22: Jessica Whitney is a 77 year old female with a history of memory disturbance.  She returns today for follow-up.  She is here today with her husband and son.  Her husband feels that her memory is much worse.  The patient feels that her memory is stable.  Husband states that  she will forget something within 5 seconds.  He states that she is sleeping most of the day and awake during the night.During the night she thinks its time to pack and leave. She will start packing things up and put it on the porch. Able to complete all ADLs independently. No longer driving. Still  taking Namenda XR daily. Husband tuses their cat to distract her to keep her from packing clothes up. Son will also come get her and take her out for a while. She doesn't cook but does reheat food on the stove. Husband manages medications, appts and finances.   09/22/21: Jessica Whitney is a 77 year old female with a history of memory disturbance.  She returns today for follow-up.  She is here today with her husband.  Her son was not able to make this appointment.  Reports that she feels that her memory is stable. Husband reports that he helps with her medications, appointments. Husband reports that she needs eye drops 6 times a day so he has to help her with that. Sometimes refuses nighttime medication. Does not drive- husband has to tell her that her car is in the shop in order to keep her from obsessing over driving. Some agitation noted. Sometimes doesn't recognize her home.  Continues to tolerate Namenda XR well.  Husband states that has  been helpful to have the extended release form  03/24/21: Jessica Whitney is a 77 year old female with a history of dementia.  She returns today for follow-up.  She is here today with her husband and son.  The husband reports that typically in the afternoons she will come more confused.  She states that she is in the wrong home and wants to pack up her belongings.  He states by the next morning this has resolved and she unpacked her belongings.  She is able to complete all ADLs independently.  She does not operate a motor vehicle.  He helps manage her medications and appointments.  He has not looked into extra help in the home.  He does report that he sometimes has trouble getting her to  take her nighttime medications. she returns today for an evaluation.  09/16/20: Jessica Whitney is a 77 year old female with a history of dementia.  She returns today for follow-up.  She is here today with her husband.  Husband reports that she continues to have trouble with her short-term memory.  Reports that they went to a birthday party and she did not remember it 2 hours later.  She also sometimes forgets that she is married to him.  She is able to complete all ADLs independently.  Denies any trouble sleeping.  Mood and behavior has remained relatively stable.  She is currently on Namenda 10 mg twice a day.  Husband reports that he is having a hard time getting her to take her nighttime medication.  06/03/20:Jessica Whitney is  a 77 year old female with a history of dementia.  She returns today for follow-up.  She is here today with her husband and son.  They both feel that her memory has gotten worse.  The patient feels that her memory is stable.  They report that she continues to be repetitive.  They have a hard time getting her to go to places.  Reports that they had a hard time getting her car to come to this appointment today.  At home patient is able to complete all ADLs independently.  She does not operate a motor vehicle.  She does not do any cooking.  She remains on Namenda 10 mg twice a day.  No hallucinations or delusions.  No significant change in her mood or behavior.   03/16/20:Jessica Whitney is a 77 year old female with a history of dementia.  She returns today for follow-up.  Her husband is with her today.  He feels that her memory has gotten worse.  He states that there is been a gradual decline over the last several months.  She is still able to complete all ADLs independently.  She does not operate a motor vehicle.  He states that she tends to fall asleep on the couch and sleeps in late the next day.  Patient reports good appetite.  Denies any change in mood or behavior.  No hallucinations or  delusions.  Has reports that she can be repetitive at times.  She is currently taking Namenda 5 mg twice a day   HISTORY 12/02/19:   Jessica Whitney is a 77 year old female with a history of dementia.  She returns today for follow-up.  She is here today with her husband and her son.  They feel that over the last year her memory has gotten worse.  She lives at home with her husband.  She is able to complete all ADLs independently.  She no longer operates a motor vehicle.  She no longer  cooks but the husband does not feel that this its due to her memory.  He reports that at bedtime sometimes she forgets that she is married and would ask him to sleep in another room.  Her son also notes that sometimes she may not remember things about her grandchildren.  She was on Aricept but was taken off of this medication due to weight loss.  Although the family reports that she had acute pancreatitis and that is what contributed to the weight loss she returns today for an evaluation.  REVIEW OF SYSTEMS: Out of a complete 14 system review of symptoms, the patient complains only of the following symptoms, and all other reviewed systems are negative.  See HPI  ALLERGIES: Allergies  Allergen Reactions   Sulfa Antibiotics Other (See Comments)    Caused mouth sores    HOME MEDICATIONS: Outpatient Medications Prior to Visit  Medication Sig Dispense Refill   Acetaminophen (TYLENOL PO) Take by mouth.     divalproex (DEPAKOTE SPRINKLE) 125 MG capsule Take by mouth.     DULoxetine (CYMBALTA) 30 MG capsule Take 30 mg by mouth daily.     famotidine (PEPCID) 40 MG tablet Take 40 mg by mouth at bedtime.     lactose free nutrition (BOOST) LIQD Take 237 mLs by mouth 2 (two) times daily.     levothyroxine (SYNTHROID) 100 MCG tablet Take 100 mcg by mouth daily.     memantine (NAMENDA XR) 28 MG CP24 24 hr capsule TAKE 1 CAPSULE EVERY DAY 90 capsule 3   QUEtiapine (SEROQUEL) 25 MG tablet Take 1 tablet (25 mg total) by mouth at  bedtime. 30 tablet 0   sertraline (ZOLOFT) 25 MG tablet Take 25 mg by mouth daily.     valACYclovir (VALTREX) 500 MG tablet Take by mouth.     divalproex (DEPAKOTE SPRINKLE) 125 MG capsule Take 1 capsule (125 mg total) by mouth 3 (three) times daily. 90 capsule 0   DULoxetine 40 MG CPEP Take 0.5 capsules (20 mg total) by mouth daily. 15 capsule 0   levothyroxine (SYNTHROID) 75 MCG tablet Take 1 tablet (75 mcg total) by mouth daily at 6 (six) AM. 30 tablet 0   QUEtiapine (SEROQUEL) 25 MG tablet Take 1 tablet (25 mg total) by mouth at bedtime. 30 tablet 0   No facility-administered medications prior to visit.    PAST MEDICAL HISTORY: Past Medical History:  Diagnosis Date   Acute pancreatitis 08/30/2019   Alzheimer's dementia (HCC)    Arthritis    Diverticulitis    Fibromyalgia    GERD (gastroesophageal reflux disease)    bucchini   Hypokalemia 08/30/2019   Hypothyroidism 08/30/2019   Major depressive disorder in remission 08/30/2019   Major neurocognitive disorder due to Alzheimer's disease 09/22/2019   PONV (postoperative nausea and vomiting)    Spinal stenosis    Vitamin B12 deficiency    Vitamin D deficiency     PAST SURGICAL HISTORY: Past Surgical History:  Procedure Laterality Date   APPENDECTOMY     BACK SURGERY     bone spur shoulder Bilateral    BREAST EXCISIONAL BIOPSY Right    BREAST SURGERY Right    lumpectomy non cancerous   CHOLECYSTECTOMY     COLON SURGERY     foot of colon removed - diverticulitis   EYE SURGERY Left    cross-eye fixed   JOINT REPLACEMENT Right    knee   knee arthroscopic Right    LUMBAR LAMINECTOMY/DECOMPRESSION MICRODISCECTOMY N/A 04/03/2013  Procedure: Lumbar Four-Five Lumbar diskectomy with Coflex;  Surgeon: Reinaldo Meeker, MD;  Location: [facility];  Service: Neurosurgery;  Laterality: N/A;  L4-5 Lumbar diskectomy with Coflex   TRIGGER FINGER RELEASE Right    x2   TUBAL LIGATION      FAMILY HISTORY: Family History  Problem  Relation Age of Onset   Memory loss Neg Hx    Alzheimer's disease Neg Hx     SOCIAL HISTORY: Social History   Socioeconomic History   Marital status: Married    Spouse name: Not on file   Number of children: Not on file   Years of education: 13   Highest education level: Some college, no degree  Occupational History   Occupation: Retired    Comment: Geophysicist/field seismologist for 20 years  Tobacco Use   Smoking status: Former   Smokeless tobacco: Never  Advertising account planner   Vaping status: Never Used  Substance and Sexual Activity   Alcohol use: Not Currently    Alcohol/week: 0.0 standard drinks of alcohol    Comment: very rare glass of wine   Drug use: No   Sexual activity: Not on file  Other Topics Concern   Not on file  Social History Narrative   Caffeine: tea 2-3 large cups/day    Social Determinants of Health   Financial Resource Strain: Not on file  Food Insecurity: Not on file  Transportation Needs: Not on file  Physical Activity: Not on file  Stress: Not on file  Social Connections: Unknown (02/28/2023)   Received from [facility]   Social Network    Social Network: Not on file  Intimate Partner Violence: Not At Risk (04/18/2023)   Received from [facility]   HITS    Over the last 12 months how often did your partner physically hurt you?: 1    Over the last 12 months how often did your partner insult you or talk down to you?: 1    Over the last 12 months how often did your partner threaten you with physical harm?: 1    Over the last 12 months how often did your partner scream or curse at you?: 1      PHYSICAL EXAM  Vitals:   05/03/23 1416  BP: (!) 100/58  Pulse: 90  Weight: 107 lb 3.2 oz (48.6 kg)  Height: 5\' 10"  (1.778 m)    Body mass index is 15.38 kg/m.      10/19/2022    1:59 PM 03/30/2022    1:29 PM 09/22/2021    1:11 PM  MMSE - Mini Mental State Exam  Orientation to time 0 0 1  Orientation to Place 2 5 5   Registration 3 3 3   Attention/  Calculation 1 4 0  Recall 0 0 0  Language- name 2 objects 2 2 2   Language- repeat 0 0 1  Language- follow 3 step command 3 3 3   Language- read & follow direction 1 1 0  Write a sentence 1 1 0  Copy design 0 0 1  Total score 13 19 16     Generalized: Well developed, in no acute distress   Neurological examination  Mentation: Alert Follows all commands.  Patient does get mildly agitated at the end of the visit.  Asking when it is time to leave. Cranial nerve II-XII: Pupils were equal round reactive to light. Extraocular movements were full, visual field were full on confrontational test. Facial sensation and strength were normal. Head turning and shoulder  shrug  were normal and symmetric. Motor: The motor testing reveals 5 over 5 strength of all 4 extremities. Good symmetric motor tone is noted throughout.  Sensory: Sensory testing is intact to soft touch on all 4 extremities. No evidence of extinction is noted.  Coordination: Cerebellar testing reveals good finger-nose-finger and heel-to-shin bilaterally.  Gait and station: Gait is normal.    DIAGNOSTIC DATA (LABS, IMAGING, TESTING) - I reviewed patient records, labs, notes, testing and imaging myself where available.  Lab Results  Component Value Date   WBC 5.2 12/23/2022   HGB 11.5 (L) 12/23/2022   HCT 35.6 (L) 12/23/2022   MCV 100.0 12/23/2022   PLT 232 12/23/2022      Component Value Date/Time   NA 139 12/23/2022 1004   K 3.7 12/23/2022 1004   CL 106 12/23/2022 1004   CO2 26 12/23/2022 1004   GLUCOSE 101 (H) 12/23/2022 1004   BUN 14 12/23/2022 1004   CREATININE 0.99 12/23/2022 1004   CALCIUM 8.5 (L) 12/23/2022 1004   PROT 4.9 (L) 09/05/2019 0517   ALBUMIN 2.3 (L) 09/05/2019 0517   AST 48 (H) 09/05/2019 0517   ALT 36 09/05/2019 0517   ALKPHOS 179 (H) 09/05/2019 0517   BILITOT 0.9 09/05/2019 0517   GFRNONAA 59 (L) 12/23/2022 1004   GFRAA >60 09/06/2019 0444    Lab Results  Component Value Date   TSH 2.229  09/01/2019      ASSESSMENT AND PLAN 77 y.o. year old female  has a past medical history of Acute pancreatitis (08/30/2019), Alzheimer's dementia (HCC), Arthritis, Diverticulitis, Fibromyalgia, GERD (gastroesophageal reflux disease), Hypokalemia (08/30/2019), Hypothyroidism (08/30/2019), Major depressive disorder in remission (08/30/2019), Major neurocognitive disorder due to Alzheimer's disease (09/22/2019), PONV (postoperative nausea and vomiting), Spinal stenosis, Vitamin B12 deficiency, and Vitamin D deficiency. here with :  1.  Dementia  Had a long discussion with her son.  Advised that certainly her change in mentation and agitation can be a part of the progression of dementia.  However it is not unreasonable to try to adjust medication to see if that is also contributing.  Advised that they could discuss reducing Depakote or Seroquel dosage.  Also advised that she is on 2 antidepressants Cymbalta and sertraline.  Could consider discontinuing one of these.  Reiterated several times that there may be a good reason that they have her on these medications and certainly any change could increase agitation.  Son voices understanding and states that he plans to discuss with the physician at her facility.  Advised that if symptoms worsen or she develops new symptoms they should let us know.  Follow-up in 7 or 8 months or sooner if needed    Butch Penny, MSN, NP-C 05/03/2023, 2:26 PM [facility] [address] [place], [place] [address] [phone]

## 2023-05-03 NOTE — Patient Instructions (Signed)
Your Plan:       Thank you for coming to see [place] at [facility]. I hope we have been able to provide you high quality care today.  You may receive a patient satisfaction survey over the next few weeks. We would appreciate your feedback and comments so that we may continue to improve ourselves and the health of our patients.

## 2023-05-04 DIAGNOSIS — M62561 Muscle wasting and atrophy, not elsewhere classified, right lower leg: Secondary | ICD-10-CM | POA: Diagnosis not present

## 2023-05-04 DIAGNOSIS — E039 Hypothyroidism, unspecified: Secondary | ICD-10-CM | POA: Diagnosis not present

## 2023-05-04 DIAGNOSIS — M62551 Muscle wasting and atrophy, not elsewhere classified, right thigh: Secondary | ICD-10-CM | POA: Diagnosis not present

## 2023-05-04 DIAGNOSIS — M62562 Muscle wasting and atrophy, not elsewhere classified, left lower leg: Secondary | ICD-10-CM | POA: Diagnosis not present

## 2023-05-04 DIAGNOSIS — M62552 Muscle wasting and atrophy, not elsewhere classified, left thigh: Secondary | ICD-10-CM | POA: Diagnosis not present

## 2023-05-07 DIAGNOSIS — R296 Repeated falls: Secondary | ICD-10-CM | POA: Diagnosis not present

## 2023-05-07 DIAGNOSIS — M62562 Muscle wasting and atrophy, not elsewhere classified, left lower leg: Secondary | ICD-10-CM | POA: Diagnosis not present

## 2023-05-07 DIAGNOSIS — F5105 Insomnia due to other mental disorder: Secondary | ICD-10-CM | POA: Diagnosis not present

## 2023-05-07 DIAGNOSIS — F331 Major depressive disorder, recurrent, moderate: Secondary | ICD-10-CM | POA: Diagnosis not present

## 2023-05-07 DIAGNOSIS — R41841 Cognitive communication deficit: Secondary | ICD-10-CM | POA: Diagnosis not present

## 2023-05-07 DIAGNOSIS — M62551 Muscle wasting and atrophy, not elsewhere classified, right thigh: Secondary | ICD-10-CM | POA: Diagnosis not present

## 2023-05-07 DIAGNOSIS — R2681 Unsteadiness on feet: Secondary | ICD-10-CM | POA: Diagnosis not present

## 2023-05-07 DIAGNOSIS — F411 Generalized anxiety disorder: Secondary | ICD-10-CM | POA: Diagnosis not present

## 2023-05-07 DIAGNOSIS — R4789 Other speech disturbances: Secondary | ICD-10-CM | POA: Diagnosis not present

## 2023-05-07 DIAGNOSIS — E559 Vitamin D deficiency, unspecified: Secondary | ICD-10-CM | POA: Diagnosis not present

## 2023-05-07 DIAGNOSIS — F0390 Unspecified dementia without behavioral disturbance: Secondary | ICD-10-CM | POA: Diagnosis not present

## 2023-05-07 DIAGNOSIS — R1310 Dysphagia, unspecified: Secondary | ICD-10-CM | POA: Diagnosis not present

## 2023-05-07 DIAGNOSIS — E039 Hypothyroidism, unspecified: Secondary | ICD-10-CM | POA: Diagnosis not present

## 2023-05-07 DIAGNOSIS — M62561 Muscle wasting and atrophy, not elsewhere classified, right lower leg: Secondary | ICD-10-CM | POA: Diagnosis not present

## 2023-05-07 DIAGNOSIS — N1831 Chronic kidney disease, stage 3a: Secondary | ICD-10-CM | POA: Diagnosis not present

## 2023-05-07 DIAGNOSIS — M62552 Muscle wasting and atrophy, not elsewhere classified, left thigh: Secondary | ICD-10-CM | POA: Diagnosis not present

## 2023-05-07 DIAGNOSIS — R488 Other symbolic dysfunctions: Secondary | ICD-10-CM | POA: Diagnosis not present

## 2023-05-08 DIAGNOSIS — M62562 Muscle wasting and atrophy, not elsewhere classified, left lower leg: Secondary | ICD-10-CM | POA: Diagnosis not present

## 2023-05-08 DIAGNOSIS — M62561 Muscle wasting and atrophy, not elsewhere classified, right lower leg: Secondary | ICD-10-CM | POA: Diagnosis not present

## 2023-05-08 DIAGNOSIS — M62551 Muscle wasting and atrophy, not elsewhere classified, right thigh: Secondary | ICD-10-CM | POA: Diagnosis not present

## 2023-05-08 DIAGNOSIS — E039 Hypothyroidism, unspecified: Secondary | ICD-10-CM | POA: Diagnosis not present

## 2023-05-08 DIAGNOSIS — R2681 Unsteadiness on feet: Secondary | ICD-10-CM | POA: Diagnosis not present

## 2023-05-08 DIAGNOSIS — M62552 Muscle wasting and atrophy, not elsewhere classified, left thigh: Secondary | ICD-10-CM | POA: Diagnosis not present

## 2023-05-08 DIAGNOSIS — R296 Repeated falls: Secondary | ICD-10-CM | POA: Diagnosis not present

## 2023-05-09 DIAGNOSIS — E039 Hypothyroidism, unspecified: Secondary | ICD-10-CM | POA: Diagnosis not present

## 2023-05-09 DIAGNOSIS — M62561 Muscle wasting and atrophy, not elsewhere classified, right lower leg: Secondary | ICD-10-CM | POA: Diagnosis not present

## 2023-05-09 DIAGNOSIS — R1310 Dysphagia, unspecified: Secondary | ICD-10-CM | POA: Diagnosis not present

## 2023-05-09 DIAGNOSIS — M62552 Muscle wasting and atrophy, not elsewhere classified, left thigh: Secondary | ICD-10-CM | POA: Diagnosis not present

## 2023-05-09 DIAGNOSIS — M62551 Muscle wasting and atrophy, not elsewhere classified, right thigh: Secondary | ICD-10-CM | POA: Diagnosis not present

## 2023-05-09 DIAGNOSIS — R488 Other symbolic dysfunctions: Secondary | ICD-10-CM | POA: Diagnosis not present

## 2023-05-09 DIAGNOSIS — R41841 Cognitive communication deficit: Secondary | ICD-10-CM | POA: Diagnosis not present

## 2023-05-09 DIAGNOSIS — M62562 Muscle wasting and atrophy, not elsewhere classified, left lower leg: Secondary | ICD-10-CM | POA: Diagnosis not present

## 2023-05-09 DIAGNOSIS — R4789 Other speech disturbances: Secondary | ICD-10-CM | POA: Diagnosis not present

## 2023-05-10 ENCOUNTER — Encounter: Payer: Self-pay | Admitting: Adult Health

## 2023-05-10 DIAGNOSIS — R2681 Unsteadiness on feet: Secondary | ICD-10-CM | POA: Diagnosis not present

## 2023-05-10 DIAGNOSIS — R296 Repeated falls: Secondary | ICD-10-CM | POA: Diagnosis not present

## 2023-05-14 DIAGNOSIS — R1310 Dysphagia, unspecified: Secondary | ICD-10-CM | POA: Diagnosis not present

## 2023-05-14 DIAGNOSIS — R41841 Cognitive communication deficit: Secondary | ICD-10-CM | POA: Diagnosis not present

## 2023-05-14 DIAGNOSIS — R488 Other symbolic dysfunctions: Secondary | ICD-10-CM | POA: Diagnosis not present

## 2023-05-14 DIAGNOSIS — F0390 Unspecified dementia without behavioral disturbance: Secondary | ICD-10-CM | POA: Diagnosis not present

## 2023-05-14 DIAGNOSIS — M62561 Muscle wasting and atrophy, not elsewhere classified, right lower leg: Secondary | ICD-10-CM | POA: Diagnosis not present

## 2023-05-14 DIAGNOSIS — R2681 Unsteadiness on feet: Secondary | ICD-10-CM | POA: Diagnosis not present

## 2023-05-14 DIAGNOSIS — R296 Repeated falls: Secondary | ICD-10-CM | POA: Diagnosis not present

## 2023-05-14 DIAGNOSIS — M62552 Muscle wasting and atrophy, not elsewhere classified, left thigh: Secondary | ICD-10-CM | POA: Diagnosis not present

## 2023-05-14 DIAGNOSIS — M62562 Muscle wasting and atrophy, not elsewhere classified, left lower leg: Secondary | ICD-10-CM | POA: Diagnosis not present

## 2023-05-14 DIAGNOSIS — E039 Hypothyroidism, unspecified: Secondary | ICD-10-CM | POA: Diagnosis not present

## 2023-05-14 DIAGNOSIS — R4789 Other speech disturbances: Secondary | ICD-10-CM | POA: Diagnosis not present

## 2023-05-14 DIAGNOSIS — M62551 Muscle wasting and atrophy, not elsewhere classified, right thigh: Secondary | ICD-10-CM | POA: Diagnosis not present

## 2023-05-15 DIAGNOSIS — M62551 Muscle wasting and atrophy, not elsewhere classified, right thigh: Secondary | ICD-10-CM | POA: Diagnosis not present

## 2023-05-15 DIAGNOSIS — M62561 Muscle wasting and atrophy, not elsewhere classified, right lower leg: Secondary | ICD-10-CM | POA: Diagnosis not present

## 2023-05-15 DIAGNOSIS — R296 Repeated falls: Secondary | ICD-10-CM | POA: Diagnosis not present

## 2023-05-15 DIAGNOSIS — E039 Hypothyroidism, unspecified: Secondary | ICD-10-CM | POA: Diagnosis not present

## 2023-05-15 DIAGNOSIS — M62562 Muscle wasting and atrophy, not elsewhere classified, left lower leg: Secondary | ICD-10-CM | POA: Diagnosis not present

## 2023-05-15 DIAGNOSIS — R2681 Unsteadiness on feet: Secondary | ICD-10-CM | POA: Diagnosis not present

## 2023-05-15 DIAGNOSIS — M62552 Muscle wasting and atrophy, not elsewhere classified, left thigh: Secondary | ICD-10-CM | POA: Diagnosis not present

## 2023-05-16 DIAGNOSIS — M62562 Muscle wasting and atrophy, not elsewhere classified, left lower leg: Secondary | ICD-10-CM | POA: Diagnosis not present

## 2023-05-16 DIAGNOSIS — M62552 Muscle wasting and atrophy, not elsewhere classified, left thigh: Secondary | ICD-10-CM | POA: Diagnosis not present

## 2023-05-16 DIAGNOSIS — R2681 Unsteadiness on feet: Secondary | ICD-10-CM | POA: Diagnosis not present

## 2023-05-16 DIAGNOSIS — R296 Repeated falls: Secondary | ICD-10-CM | POA: Diagnosis not present

## 2023-05-16 DIAGNOSIS — E039 Hypothyroidism, unspecified: Secondary | ICD-10-CM | POA: Diagnosis not present

## 2023-05-16 DIAGNOSIS — M62551 Muscle wasting and atrophy, not elsewhere classified, right thigh: Secondary | ICD-10-CM | POA: Diagnosis not present

## 2023-05-16 DIAGNOSIS — M62561 Muscle wasting and atrophy, not elsewhere classified, right lower leg: Secondary | ICD-10-CM | POA: Diagnosis not present

## 2023-05-17 DIAGNOSIS — R2681 Unsteadiness on feet: Secondary | ICD-10-CM | POA: Diagnosis not present

## 2023-05-17 DIAGNOSIS — R296 Repeated falls: Secondary | ICD-10-CM | POA: Diagnosis not present

## 2023-05-18 DIAGNOSIS — R1310 Dysphagia, unspecified: Secondary | ICD-10-CM | POA: Diagnosis not present

## 2023-05-18 DIAGNOSIS — R2681 Unsteadiness on feet: Secondary | ICD-10-CM | POA: Diagnosis not present

## 2023-05-18 DIAGNOSIS — E039 Hypothyroidism, unspecified: Secondary | ICD-10-CM | POA: Diagnosis not present

## 2023-05-18 DIAGNOSIS — R4789 Other speech disturbances: Secondary | ICD-10-CM | POA: Diagnosis not present

## 2023-05-18 DIAGNOSIS — N189 Chronic kidney disease, unspecified: Secondary | ICD-10-CM | POA: Diagnosis not present

## 2023-05-18 DIAGNOSIS — R488 Other symbolic dysfunctions: Secondary | ICD-10-CM | POA: Diagnosis not present

## 2023-05-18 DIAGNOSIS — R41841 Cognitive communication deficit: Secondary | ICD-10-CM | POA: Diagnosis not present

## 2023-05-18 DIAGNOSIS — R296 Repeated falls: Secondary | ICD-10-CM | POA: Diagnosis not present

## 2023-05-20 DIAGNOSIS — R2681 Unsteadiness on feet: Secondary | ICD-10-CM | POA: Diagnosis not present

## 2023-05-20 DIAGNOSIS — R296 Repeated falls: Secondary | ICD-10-CM | POA: Diagnosis not present

## 2023-05-21 DIAGNOSIS — F331 Major depressive disorder, recurrent, moderate: Secondary | ICD-10-CM | POA: Diagnosis not present

## 2023-05-21 DIAGNOSIS — F411 Generalized anxiety disorder: Secondary | ICD-10-CM | POA: Diagnosis not present

## 2023-05-21 DIAGNOSIS — F5105 Insomnia due to other mental disorder: Secondary | ICD-10-CM | POA: Diagnosis not present

## 2023-05-21 DIAGNOSIS — F0390 Unspecified dementia without behavioral disturbance: Secondary | ICD-10-CM | POA: Diagnosis not present

## 2023-05-21 DIAGNOSIS — R296 Repeated falls: Secondary | ICD-10-CM | POA: Diagnosis not present

## 2023-05-21 DIAGNOSIS — R2681 Unsteadiness on feet: Secondary | ICD-10-CM | POA: Diagnosis not present

## 2023-05-23 DIAGNOSIS — E039 Hypothyroidism, unspecified: Secondary | ICD-10-CM | POA: Diagnosis not present

## 2023-05-23 DIAGNOSIS — M62551 Muscle wasting and atrophy, not elsewhere classified, right thigh: Secondary | ICD-10-CM | POA: Diagnosis not present

## 2023-05-23 DIAGNOSIS — M62552 Muscle wasting and atrophy, not elsewhere classified, left thigh: Secondary | ICD-10-CM | POA: Diagnosis not present

## 2023-05-23 DIAGNOSIS — M62561 Muscle wasting and atrophy, not elsewhere classified, right lower leg: Secondary | ICD-10-CM | POA: Diagnosis not present

## 2023-05-23 DIAGNOSIS — M62562 Muscle wasting and atrophy, not elsewhere classified, left lower leg: Secondary | ICD-10-CM | POA: Diagnosis not present

## 2023-05-25 DIAGNOSIS — S0093XA Contusion of unspecified part of head, initial encounter: Secondary | ICD-10-CM | POA: Diagnosis not present

## 2023-05-25 DIAGNOSIS — R519 Headache, unspecified: Secondary | ICD-10-CM | POA: Diagnosis not present

## 2023-05-25 DIAGNOSIS — M542 Cervicalgia: Secondary | ICD-10-CM | POA: Diagnosis not present

## 2023-05-25 DIAGNOSIS — S0003XA Contusion of scalp, initial encounter: Secondary | ICD-10-CM | POA: Diagnosis not present

## 2023-05-25 DIAGNOSIS — F0390 Unspecified dementia without behavioral disturbance: Secondary | ICD-10-CM | POA: Diagnosis not present

## 2023-05-25 DIAGNOSIS — S0083XA Contusion of other part of head, initial encounter: Secondary | ICD-10-CM | POA: Diagnosis not present

## 2023-05-26 DIAGNOSIS — W19XXXA Unspecified fall, initial encounter: Secondary | ICD-10-CM | POA: Diagnosis not present

## 2023-05-26 DIAGNOSIS — R519 Headache, unspecified: Secondary | ICD-10-CM | POA: Diagnosis not present

## 2023-05-28 DIAGNOSIS — R296 Repeated falls: Secondary | ICD-10-CM | POA: Diagnosis not present

## 2023-05-28 DIAGNOSIS — M62562 Muscle wasting and atrophy, not elsewhere classified, left lower leg: Secondary | ICD-10-CM | POA: Diagnosis not present

## 2023-05-28 DIAGNOSIS — M62551 Muscle wasting and atrophy, not elsewhere classified, right thigh: Secondary | ICD-10-CM | POA: Diagnosis not present

## 2023-05-28 DIAGNOSIS — R54 Age-related physical debility: Secondary | ICD-10-CM | POA: Diagnosis not present

## 2023-05-28 DIAGNOSIS — M62561 Muscle wasting and atrophy, not elsewhere classified, right lower leg: Secondary | ICD-10-CM | POA: Diagnosis not present

## 2023-05-28 DIAGNOSIS — E039 Hypothyroidism, unspecified: Secondary | ICD-10-CM | POA: Diagnosis not present

## 2023-05-28 DIAGNOSIS — F0390 Unspecified dementia without behavioral disturbance: Secondary | ICD-10-CM | POA: Diagnosis not present

## 2023-05-28 DIAGNOSIS — M62552 Muscle wasting and atrophy, not elsewhere classified, left thigh: Secondary | ICD-10-CM | POA: Diagnosis not present

## 2023-05-30 DIAGNOSIS — M62562 Muscle wasting and atrophy, not elsewhere classified, left lower leg: Secondary | ICD-10-CM | POA: Diagnosis not present

## 2023-05-30 DIAGNOSIS — M62551 Muscle wasting and atrophy, not elsewhere classified, right thigh: Secondary | ICD-10-CM | POA: Diagnosis not present

## 2023-05-30 DIAGNOSIS — M62561 Muscle wasting and atrophy, not elsewhere classified, right lower leg: Secondary | ICD-10-CM | POA: Diagnosis not present

## 2023-05-30 DIAGNOSIS — M62552 Muscle wasting and atrophy, not elsewhere classified, left thigh: Secondary | ICD-10-CM | POA: Diagnosis not present

## 2023-05-30 DIAGNOSIS — E039 Hypothyroidism, unspecified: Secondary | ICD-10-CM | POA: Diagnosis not present

## 2023-05-31 DIAGNOSIS — E039 Hypothyroidism, unspecified: Secondary | ICD-10-CM | POA: Diagnosis not present

## 2023-05-31 DIAGNOSIS — M62562 Muscle wasting and atrophy, not elsewhere classified, left lower leg: Secondary | ICD-10-CM | POA: Diagnosis not present

## 2023-05-31 DIAGNOSIS — M62561 Muscle wasting and atrophy, not elsewhere classified, right lower leg: Secondary | ICD-10-CM | POA: Diagnosis not present

## 2023-05-31 DIAGNOSIS — M62552 Muscle wasting and atrophy, not elsewhere classified, left thigh: Secondary | ICD-10-CM | POA: Diagnosis not present

## 2023-05-31 DIAGNOSIS — M62551 Muscle wasting and atrophy, not elsewhere classified, right thigh: Secondary | ICD-10-CM | POA: Diagnosis not present

## 2023-06-01 DIAGNOSIS — F0390 Unspecified dementia without behavioral disturbance: Secondary | ICD-10-CM | POA: Diagnosis not present

## 2023-06-01 DIAGNOSIS — F331 Major depressive disorder, recurrent, moderate: Secondary | ICD-10-CM | POA: Diagnosis not present

## 2023-06-01 DIAGNOSIS — E039 Hypothyroidism, unspecified: Secondary | ICD-10-CM | POA: Diagnosis not present

## 2023-06-01 DIAGNOSIS — K219 Gastro-esophageal reflux disease without esophagitis: Secondary | ICD-10-CM | POA: Diagnosis not present

## 2023-06-04 DIAGNOSIS — F0150 Vascular dementia without behavioral disturbance: Secondary | ICD-10-CM | POA: Diagnosis not present

## 2023-06-04 DIAGNOSIS — F331 Major depressive disorder, recurrent, moderate: Secondary | ICD-10-CM | POA: Diagnosis not present

## 2023-06-04 DIAGNOSIS — Z8673 Personal history of transient ischemic attack (TIA), and cerebral infarction without residual deficits: Secondary | ICD-10-CM | POA: Diagnosis not present

## 2023-06-04 DIAGNOSIS — F411 Generalized anxiety disorder: Secondary | ICD-10-CM | POA: Diagnosis not present

## 2023-06-04 DIAGNOSIS — Z515 Encounter for palliative care: Secondary | ICD-10-CM | POA: Diagnosis not present

## 2023-06-28 DIAGNOSIS — N1831 Chronic kidney disease, stage 3a: Secondary | ICD-10-CM | POA: Diagnosis not present

## 2023-06-28 DIAGNOSIS — E039 Hypothyroidism, unspecified: Secondary | ICD-10-CM | POA: Diagnosis not present

## 2023-07-28 DEATH — deceased
